# Patient Record
Sex: Female | Born: 1960
Health system: Southern US, Community
[De-identification: ages and names within clinical notes are randomized; demographics above are authoritative.]

## PROBLEM LIST (undated history)

## (undated) ENCOUNTER — Ambulatory Visit (HOSPITAL_COMMUNITY): Admission: EM | Payer: 59 | Source: Home / Self Care

## (undated) DIAGNOSIS — F329 Major depressive disorder, single episode, unspecified: Secondary | ICD-10-CM

## (undated) DIAGNOSIS — R0602 Shortness of breath: Secondary | ICD-10-CM

## (undated) DIAGNOSIS — D509 Iron deficiency anemia, unspecified: Secondary | ICD-10-CM

## (undated) DIAGNOSIS — I10 Essential (primary) hypertension: Secondary | ICD-10-CM

## (undated) DIAGNOSIS — E7800 Pure hypercholesterolemia, unspecified: Secondary | ICD-10-CM

## (undated) DIAGNOSIS — N189 Chronic kidney disease, unspecified: Secondary | ICD-10-CM

## (undated) DIAGNOSIS — F319 Bipolar disorder, unspecified: Secondary | ICD-10-CM

## (undated) DIAGNOSIS — R079 Chest pain, unspecified: Secondary | ICD-10-CM

## (undated) DIAGNOSIS — I2699 Other pulmonary embolism without acute cor pulmonale: Secondary | ICD-10-CM

## (undated) DIAGNOSIS — I82409 Acute embolism and thrombosis of unspecified deep veins of unspecified lower extremity: Secondary | ICD-10-CM

## (undated) DIAGNOSIS — J189 Pneumonia, unspecified organism: Secondary | ICD-10-CM

## (undated) DIAGNOSIS — T7840XA Allergy, unspecified, initial encounter: Secondary | ICD-10-CM

## (undated) DIAGNOSIS — F32A Depression, unspecified: Secondary | ICD-10-CM

## (undated) DIAGNOSIS — E119 Type 2 diabetes mellitus without complications: Secondary | ICD-10-CM

## (undated) HISTORY — DX: Depression, unspecified: F32.A

## (undated) HISTORY — DX: Allergy, unspecified, initial encounter: T78.40XA

## (undated) HISTORY — DX: Major depressive disorder, single episode, unspecified: F32.9

## (undated) HISTORY — DX: Essential (primary) hypertension: I10

## (undated) HISTORY — DX: Chronic kidney disease, unspecified: N18.9

---

## 2003-03-03 ENCOUNTER — Other Ambulatory Visit: Admission: RE | Admit: 2003-03-03 | Discharge: 2003-03-03 | Payer: Self-pay | Admitting: Obstetrics and Gynecology

## 2004-06-14 ENCOUNTER — Encounter: Admission: RE | Admit: 2004-06-14 | Discharge: 2004-06-14 | Payer: Self-pay | Admitting: Obstetrics and Gynecology

## 2005-06-20 ENCOUNTER — Encounter: Admission: RE | Admit: 2005-06-20 | Discharge: 2005-06-20 | Payer: Self-pay | Admitting: Obstetrics and Gynecology

## 2006-06-26 ENCOUNTER — Encounter: Admission: RE | Admit: 2006-06-26 | Discharge: 2006-06-26 | Payer: Self-pay | Admitting: Cardiovascular Disease

## 2007-08-12 ENCOUNTER — Encounter: Admission: RE | Admit: 2007-08-12 | Discharge: 2007-08-12 | Payer: Self-pay | Admitting: Obstetrics and Gynecology

## 2008-08-25 ENCOUNTER — Encounter: Admission: RE | Admit: 2008-08-25 | Discharge: 2008-08-25 | Payer: Self-pay | Admitting: Cardiovascular Disease

## 2009-09-14 ENCOUNTER — Encounter: Admission: RE | Admit: 2009-09-14 | Discharge: 2009-09-14 | Payer: Self-pay | Admitting: Obstetrics and Gynecology

## 2010-09-17 ENCOUNTER — Other Ambulatory Visit: Payer: Self-pay | Admitting: Cardiovascular Disease

## 2010-09-17 DIAGNOSIS — Z1239 Encounter for other screening for malignant neoplasm of breast: Secondary | ICD-10-CM

## 2010-09-30 ENCOUNTER — Ambulatory Visit
Admission: RE | Admit: 2010-09-30 | Discharge: 2010-09-30 | Disposition: A | Discharge status: Home / Self Care | Payer: 59 | Source: Ambulatory Visit | Attending: Cardiovascular Disease | Admitting: Cardiovascular Disease

## 2010-09-30 DIAGNOSIS — Z1239 Encounter for other screening for malignant neoplasm of breast: Secondary | ICD-10-CM

## 2011-08-28 ENCOUNTER — Other Ambulatory Visit: Payer: Self-pay | Admitting: Obstetrics and Gynecology

## 2011-08-28 DIAGNOSIS — Z1231 Encounter for screening mammogram for malignant neoplasm of breast: Secondary | ICD-10-CM

## 2011-10-09 ENCOUNTER — Ambulatory Visit
Admission: RE | Admit: 2011-10-09 | Discharge: 2011-10-09 | Disposition: A | Discharge status: Home / Self Care | Payer: 59 | Source: Ambulatory Visit | Attending: Obstetrics and Gynecology | Admitting: Obstetrics and Gynecology

## 2011-10-09 DIAGNOSIS — Z1231 Encounter for screening mammogram for malignant neoplasm of breast: Secondary | ICD-10-CM

## 2011-11-27 ENCOUNTER — Ambulatory Visit (INDEPENDENT_AMBULATORY_CARE_PROVIDER_SITE_OTHER): Payer: 59 | Admitting: Family Medicine

## 2011-11-27 VITALS — BP 156/96 | HR 64 | Temp 98.1°F | Resp 16 | Ht 62.5 in | Wt 170.4 lb

## 2011-11-27 DIAGNOSIS — H538 Other visual disturbances: Secondary | ICD-10-CM

## 2011-11-27 DIAGNOSIS — IMO0001 Reserved for inherently not codable concepts without codable children: Secondary | ICD-10-CM

## 2011-11-27 DIAGNOSIS — R631 Polydipsia: Secondary | ICD-10-CM

## 2011-11-27 LAB — POCT GLYCOSYLATED HEMOGLOBIN (HGB A1C): Hemoglobin A1C: >=14

## 2011-11-27 LAB — GLUCOSE, POCT (MANUAL RESULT ENTRY): POC Glucose: 436

## 2011-11-27 MED ORDER — METFORMIN HCL 500 MG PO TABS: ORAL_TABLET | ORAL | Status: DC

## 2011-11-27 NOTE — Progress Notes (Signed)
  Patient Name: Cynthia Cameron Date of Birth: October 23, 1960 Medical Record Number: [id number] Gender: female Date of Encounter: 11/27/2011  History of Present Illness:  Cynthia Cameron is a 51 y.o. very pleasant female patient who presents with the following:  Here today with concern regarding her glucose.  She has been told that she was "borderline" by her cardiologist.  She checked her glucose last night and it fluctuated between 320 and 400 just after eating dessert.    She has a cardiologist who treats her for HTN.   She also has been on zypreza for several years.  She notes increased thirst and urination, and blurry vision for a couple of months.  Actually these symptoms have been present for about 6 months, but worse for the last couple of months  There is no problem list on file for this patient.  No past medical history on file. No past surgical history on file. History  Substance Use Topics  . Smoking status: Never Smoker   . Smokeless tobacco: Not on file  . Alcohol Use: Not on file   No family history on file. No Known Allergies  Medication list has been reviewed and updated.  Review of Systems: As per HPI- otherwise negative.  Physical Examination: Filed Vitals:   11/27/11 1936  BP: 156/96  Pulse: 64  Temp: 98.1 F (36.7 C)  TempSrc: Oral  Resp: 16  Height: 5' 2.5" (1.588 m)  Weight: 170 lb 6.4 oz (77.293 kg)    Body mass index is 30.67 kg/(m^2).  GEN: WDWN, NAD, Non-toxic, A & O x 3, overweight HEENT: Atraumatic, Normocephalic. Neck supple. No masses, No LAD.  PEERL, EOMI, fundoscopic exam wnl Ears and Nose: No external deformity. CV: RRR, No M/G/R. No JVD. No thrill. No extra heart sounds. PULM: CTA B, no wheezes, crackles, rhonchi. No retractions. No resp. distress. No accessory muscle use. EXTR: No c/c/e NEURO Normal gait.  PSYCH: Normally interactive. Conversant. Not depressed or anxious appearing.  Calm demeanor.   Results for orders  placed in visit on 11/27/11  POCT GLYCOSYLATED HEMOGLOBIN (HGB A1C)      Component Value Range   Hemoglobin A1C >=14.0    GLUCOSE, POCT (MANUAL RESULT ENTRY)      Component Value Range   POC Glucose 436     Assessment and Plan: 1. Polydipsia  POCT glycosylated hemoglobin (Hb A1C), POCT glucose (manual entry)  2. Blurry vision    3. Diabetes mellitus  metFORMIN (GLUCOPHAGE) 500 MG tablet, Comprehensive metabolic panel   Uncontrolled DMII.  These symptoms are not acute.  Breindy is not eager to start injectable medications.  Will await CMP tomorrow and change plan if any sign of acidosis.  For now start metformin with goal of reaching 1000mg  BID in about one week.  Discussed diet changes.  She will see an eye doctor later on this week.    I did let Janeisha know that she will likely need insulin in the end due to her very high A1c, but she wishes to try oral medications first and this is a reasonable plan.   She has access to a glucose meter and will check her am fasting sugars and come see me in a week with her record

## 2011-11-27 NOTE — Patient Instructions (Addendum)
You have Diabetes- type 2.  Work on decreasing the sugar in your diet- especially sugar sweetened drinks.  Check your glucose in the morning before you eat and write it down.    Check out the American Diabetes Association website for more information and diet tips.   Recheck here in one week

## 2011-11-28 LAB — COMPREHENSIVE METABOLIC PANEL
ALT: 17 U/L (ref 0–35)
AST: 14 U/L (ref 0–37)
CO2: 29 mEq/L (ref 19–32)
Creat: 1.63 mg/dL — ABNORMAL HIGH (ref 0.50–1.10)
Sodium: 135 mEq/L (ref 135–145)
Total Bilirubin: 0.4 mg/dL (ref 0.3–1.2)
Total Protein: 7.3 g/dL (ref 6.0–8.3)

## 2011-11-29 ENCOUNTER — Telehealth: Payer: Self-pay

## 2011-11-29 NOTE — Telephone Encounter (Signed)
Dr Patsy Lager, letter/form is in your box

## 2011-11-29 NOTE — Telephone Encounter (Signed)
Pt employer needs more info regarding her diagnosis she dropped these papers off from her employer to be filled out by [place]

## 2011-11-30 ENCOUNTER — Encounter: Payer: Self-pay | Admitting: Family Medicine

## 2011-12-01 ENCOUNTER — Telehealth: Payer: Self-pay

## 2011-12-01 NOTE — Telephone Encounter (Signed)
.  umfc The patient called to state that [facility] at [facility] needs MD approval for diabetic meter upgrade and Rx for lancets, etc.  Pt saw Dr. Patsy Lager on  11/27/11.  Pt phone number is [phone].

## 2011-12-02 ENCOUNTER — Ambulatory Visit (INDEPENDENT_AMBULATORY_CARE_PROVIDER_SITE_OTHER): Payer: 59 | Admitting: Family Medicine

## 2011-12-02 DIAGNOSIS — IMO0001 Reserved for inherently not codable concepts without codable children: Secondary | ICD-10-CM

## 2011-12-02 DIAGNOSIS — D234 Other benign neoplasm of skin of scalp and neck: Secondary | ICD-10-CM

## 2011-12-02 DIAGNOSIS — Q828 Other specified congenital malformations of skin: Secondary | ICD-10-CM

## 2011-12-02 DIAGNOSIS — L659 Nonscarring hair loss, unspecified: Secondary | ICD-10-CM

## 2011-12-02 LAB — GLUCOSE, POCT (MANUAL RESULT ENTRY): POC Glucose: 408

## 2011-12-02 LAB — BASIC METABOLIC PANEL
BUN: 22 mg/dL (ref 6–23)
CO2: 29 mEq/L (ref 19–32)
Calcium: 10.1 mg/dL (ref 8.4–10.5)
Creat: 1.38 mg/dL — ABNORMAL HIGH (ref 0.50–1.10)
Glucose, Bld: 403 mg/dL — ABNORMAL HIGH (ref 70–99)

## 2011-12-02 LAB — LIPID PANEL: Cholesterol: 246 mg/dL — ABNORMAL HIGH (ref 0–200)

## 2011-12-02 LAB — TSH: TSH: 0.736 u[IU]/mL (ref 0.350–4.500)

## 2011-12-02 MED ORDER — BLOOD GLUCOSE METER KIT: PACK | Status: DC

## 2011-12-02 MED ORDER — LISINOPRIL 10 MG PO TABS: 10.0000 mg | ORAL_TABLET | Freq: Every day | ORAL | Status: DC

## 2011-12-02 NOTE — Telephone Encounter (Signed)
This was changed for patient at office visit today-- please disregard.

## 2011-12-02 NOTE — Progress Notes (Signed)
Patient Name: Cynthia Cameron Date of Birth: [birth year] Medical Record Number: [id number] Gender: female Date of Encounter: 12/02/2011  History of Present Illness:  Cynthia Cameron is a 51 y.o. very pleasant female patient who presents with the following:  Here today to discuss her DM again- she was here on 11/27/11 and diagnosed with DM.  She has started metformin and is feeling better- she is now up to one 500mg  in the am, and 2 in the evening.    She has some trouble with her vision still- she will see an eye doctor soon but is waiting until her glucose is better. She feels that her vision is better already and that she can do her job as long as her duties are modified   She also has some skin tags on her chest and neck that she would like to have removed.   There is no problem list on file for this patient.  No past medical history on file. No past surgical history on file. History  Substance Use Topics  . Smoking status: Never Smoker   . Smokeless tobacco: Not on file  . Alcohol Use: Yes     social   No family history on file. No Known Allergies  Medication list has been reviewed and updated.  Review of Systems: As per HPI- otherwise negative.   Physical Examination: Filed Vitals:   12/02/11 1343 12/02/11 1344  BP: 143/89 129/88  Pulse: 82   Temp: 99.1 F (37.3 C)   TempSrc: Oral   Resp: 16   Height: 5\' 4"  (1.626 m)   Weight:  168 lb (76.204 kg)    Body mass index is 28.84 kg/(m^2).  GEN: WDWN, NAD, Non-toxic, A & O x 3 HEENT: Atraumatic, Normocephalic. Neck supple. No masses, No LAD. Ears and Nose: No external deformity. CV: RRR, No M/G/R. No JVD. No thrill. No extra heart sounds. PULM: CTA B, no wheezes, crackles, rhonchi. No retractions. No resp. distress. No accessory muscle use. EXTR: No c/c/e NEURO Normal gait.  PSYCH: Normally interactive. Conversant. Not depressed or anxious appearing.  Calm demeanor.   There are multiple skin tags on her  neck and 2 under left arm.  VC obtained.  Tags cleansed with betadine and alcohol, then anest with 1% lidocaine.  Removed 8 total skin tags with dermablade.  Hemostasis with dry- sol, dressed with band- aids.  Tolerated procedure well   Results for orders placed in visit on 12/02/11  GLUCOSE, POCT (MANUAL RESULT ENTRY)      Component Value Range   POC Glucose 408      Assessment and Plan: 1. Diabetes mellitus  Blood Glucose Monitoring Suppl (BLOOD GLUCOSE METER) kit, Basic metabolic panel, Lipid panel, POCT glucose (manual entry), lisinopril (PRINIVIL,ZESTRIL) 10 MG tablet  2. Hair loss  TSH  3. Accessory skin tags     Removed skin tags as above.  Did letter for her job again and faxed again- also gave her a copy. There may be a few letters in her chart as it took [place] a few tries to get exactly what she needed.   Will do additional labs as above today.  Also stopped her norvasc and added a low dose of lisinopril for renal protection.  She will come in for a recheck in one week.  [name] is starting to realize that insulin will likely be necessary for her, and she is becoming more comfortable with the idea.  I asked her to read about the lantus  pen, as this would be a good choice for her.  We will likely teach her to use lantus at her visit next week

## 2011-12-02 NOTE — Patient Instructions (Signed)
Let's recheck in one week.  Read about the lantus solo- star pen online.  We will probably need to start this medication at our next visit- it is one shot a day.  Stop the norvasc Add lisinopril for blood pressure and kidney protection.

## 2011-12-03 ENCOUNTER — Encounter: Payer: Self-pay | Admitting: Family Medicine

## 2011-12-11 ENCOUNTER — Ambulatory Visit (INDEPENDENT_AMBULATORY_CARE_PROVIDER_SITE_OTHER): Payer: 59 | Admitting: Family Medicine

## 2011-12-11 DIAGNOSIS — Z23 Encounter for immunization: Secondary | ICD-10-CM

## 2011-12-11 DIAGNOSIS — E119 Type 2 diabetes mellitus without complications: Secondary | ICD-10-CM

## 2011-12-11 DIAGNOSIS — E7800 Pure hypercholesterolemia, unspecified: Secondary | ICD-10-CM

## 2011-12-11 LAB — GLUCOSE, POCT (MANUAL RESULT ENTRY): POC Glucose: 171

## 2011-12-11 MED ORDER — PRAVASTATIN SODIUM 40 MG PO TABS: 40.0000 mg | ORAL_TABLET | Freq: Every day | ORAL | Status: DC

## 2011-12-11 MED ORDER — PNEUMOCOCCAL VAC POLYVALENT 25 MCG/0.5ML IJ INJ
0.5000 mL | INJECTION | Freq: Once | INTRAMUSCULAR | Status: AC
  Administered 2011-12-11: 0.5 mL via INTRAMUSCULAR

## 2011-12-11 NOTE — Progress Notes (Signed)
  Patient Name: Cynthia Cameron Date of Birth: 12-May-1961 Medical Record Number: [id number] Gender: female Date of Encounter: 12/11/2011  History of Present Illness:  Cynthia Cameron is a 51 y.o. very pleasant female patient who presents with the following:  Here to evaluate diabetes- she was diagnosed abut 2 weeks ago.  She feels pretty well overall but notes that she does still have some diarrhea/ nausea with metformin.  Her fasting glucose was 174 this morning.  Vision is still burry.  She is not wearing he glasses because "my vision is still blurry with them," however they are required for her to drive  Her psychiatrist has also changed her medication- she is now tapering onto a new medication which she cannot recall the name of.  This is supposed to help with her blood sugar as well  Glucose log shows that her blood glucose is steadily declining.   This morning her fasting glucose was 174  There is no problem list on file for this patient.  No past medical history on file. No past surgical history on file. History  Substance Use Topics  . Smoking status: Never Smoker   . Smokeless tobacco: Not on file  . Alcohol Use: Yes     social   No family history on file. No Known Allergies  Medication list has been reviewed and updated.  Review of Systems: As per HPI- otherwise negative.   Physical Examination: Filed Vitals:   12/11/11 1656  BP: 130/86  Pulse: 74  Temp: 98.2 F (36.8 C)  TempSrc: Oral  Resp: 16  Height: 5' 2.5" (1.588 m)  Weight: 172 lb (78.019 kg)    Body mass index is 30.96 kg/(m^2).  GEN: WDWN, NAD, Non-toxic, A & O x 3 HEENT: Atraumatic, Normocephalic. Neck supple. No masses, No LAD. TM, oropharynx wnl Ears and Nose: No external deformity. CV: RRR, No M/G/R. No JVD. No thrill. No extra heart sounds. PULM: CTA B, no wheezes, crackles, rhonchi. No retractions. No resp. distress. No accessory muscle use. ABD: S, NT, ND, +BS. No rebound. No  HSM. EXTR: No c/c/e NEURO Normal gait.  PSYCH: Normally interactive. Conversant. Not depressed or anxious appearing.  Calm demeanor.   Results for orders placed in visit on 12/11/11  GLUCOSE, POCT (MANUAL RESULT ENTRY)      Component Value Range   POC Glucose 171      Assessment and Plan: 1. Diabetes mellitus  POCT glucose (manual entry), pravastatin (PRAVACHOL) 40 MG tablet, pneumococcal 23 valent vaccine (PNU-IMMUNE) injection 0.5 mL  2. Hypercholesterolemia  pravastatin (PRAVACHOL) 40 MG tablet   Added pravachol today for cholesterol control.  Cynthia Cameron may still end up needing to use insulin, but she is doing very well in lowering her glucose levels so far.  I am not sure if all of her vision issues are due to hyperglycemia- she may also have some diabetic retinopathy.  Encouraged her to go ahead and see her eye doctor this week.  I also did FMLA paperwork with a planned RTW date of 2 weeks.  She will come and see [place] in about a week- may check A1c then to see if there has been any positive change.  In the meantime keep up the good work with medications and diet change.    Gave pneumovax today as she has never had this vaccination

## 2011-12-25 ENCOUNTER — Ambulatory Visit (INDEPENDENT_AMBULATORY_CARE_PROVIDER_SITE_OTHER): Payer: 59 | Admitting: Family Medicine

## 2011-12-25 VITALS — BP 156/94 | HR 63 | Temp 98.7°F | Resp 18 | Ht 63.0 in | Wt 171.8 lb

## 2011-12-25 DIAGNOSIS — IMO0001 Reserved for inherently not codable concepts without codable children: Secondary | ICD-10-CM

## 2011-12-25 DIAGNOSIS — L819 Disorder of pigmentation, unspecified: Secondary | ICD-10-CM

## 2011-12-25 DIAGNOSIS — I10 Essential (primary) hypertension: Secondary | ICD-10-CM

## 2011-12-25 LAB — GLUCOSE, POCT (MANUAL RESULT ENTRY): POC Glucose: 114

## 2011-12-25 LAB — POCT GLYCOSYLATED HEMOGLOBIN (HGB A1C): Hemoglobin A1C: 13.1

## 2011-12-25 MED ORDER — LISINOPRIL 20 MG PO TABS: 20.0000 mg | ORAL_TABLET | Freq: Every day | ORAL | Status: DC

## 2011-12-25 NOTE — Progress Notes (Signed)
Subjective: She is here for a followup visit with regard to her diabetes. Dr. Dallas Schimke who told her that she would assess her today and probably let her go back to work. A few weeks ago her sugar was running 400 and she was treated with metformin 1000 mg twice a day. Pressures are now running about 140 on the morning sample. She also had skin tags taken off last visit which are doing okay. She had questions today about the pigment she has in her skin spares areas, such as between her breasts.  Objective: A pigmented areas on her chest wall between her breasts and a couple of little splotchy areas in the right upper chest.  She went to her doctor for recheck of her visual acuity. He did change a little bit, but is still stabilizing from the control of her diabetes.  Assessment: Diabetes poorly controlled Hypopigmentation secondary to diabetes Hypertension poorly controlled  Plan: Informed her that there is nothing that can be done about the pigment, except for controlling the blood sugar tightly. We will see whether it feeds were not attempted weight.  Check her hemoglobin A1c and glucose today.  We'll need to increase the lisinopril dose to try and get better control of blood pressure  Results for orders placed in visit on 12/25/11  GLUCOSE, POCT (MANUAL RESULT ENTRY)      Component Value Range   POC Glucose 114    POCT GLYCOSYLATED HEMOGLOBIN (HGB A1C)      Component Value Range   Hemoglobin A1C 13.1

## 2011-12-25 NOTE — Patient Instructions (Signed)
Continue to watch your diet carefully and take her medicines faithfully.  Increase the lisinopril to 20 mg daily. Return one more time in one month for a followup.

## 2012-01-29 ENCOUNTER — Ambulatory Visit (INDEPENDENT_AMBULATORY_CARE_PROVIDER_SITE_OTHER): Payer: 59 | Admitting: Family Medicine

## 2012-01-29 ENCOUNTER — Other Ambulatory Visit: Payer: Self-pay | Admitting: Family Medicine

## 2012-01-29 VITALS — BP 148/108 | HR 88 | Temp 98.2°F | Resp 16 | Ht 63.0 in | Wt 168.0 lb

## 2012-01-29 DIAGNOSIS — E119 Type 2 diabetes mellitus without complications: Secondary | ICD-10-CM

## 2012-01-29 DIAGNOSIS — I10 Essential (primary) hypertension: Secondary | ICD-10-CM

## 2012-01-29 DIAGNOSIS — N189 Chronic kidney disease, unspecified: Secondary | ICD-10-CM

## 2012-01-29 LAB — POCT GLYCOSYLATED HEMOGLOBIN (HGB A1C): Hemoglobin A1C: 9.1

## 2012-01-29 MED ORDER — GLIPIZIDE 5 MG PO TABS: ORAL_TABLET | ORAL | Status: DC

## 2012-01-29 NOTE — Patient Instructions (Signed)
Decrease your metformin to one pill in the morning, continue to take 2 at night Add glucotrol (glipizide)- start with one pill before breakfast.  We may increase to twice a day later I will refer you to an endocrinologist.

## 2012-01-29 NOTE — Progress Notes (Signed)
Patient Name: Cynthia Cameron Date of Birth: 1961/02/13 Medical Record Number: [id number] Gender: female Date of Encounter: 01/29/2012  History of Present Illness:  Cynthia Cameron is a 51 y.o. very pleasant female patient who presents with the following:  She is here today to recheck on her DM.  She continues to note nausea when she eats- "with every meal."  She now has some glasses and she uses these for close vision, she feels that she can see quite well at her job.  She is back at work.  Her fasting glucose is running between 120- 130.  She has been exercising and working on her weight  There is no problem list on file for this patient.  No past medical history on file. No past surgical history on file. History  Substance Use Topics  . Smoking status: Never Smoker   . Smokeless tobacco: Not on file  . Alcohol Use: Yes     social   No family history on file. No Known Allergies  Medication list has been reviewed and updated.  Prior to Admission medications   Medication Sig Start Date End Date Taking? Authorizing Provider  Blood Glucose Monitoring Suppl (BLOOD GLUCOSE METER) kit Use as instructed 12/02/11 12/01/12 Yes Gwenlyn Found Hamdi Vari, MD  buPROPion (WELLBUTRIN) 100 MG tablet Take 100 mg by mouth 1 day or 1 dose.   Yes Historical Provider, MD  lisinopril (PRINIVIL,ZESTRIL) 20 MG tablet Take 1 tablet (20 mg total) by mouth daily. 12/25/11 12/24/12 Yes Peyton Najjar, MD  metFORMIN (GLUCOPHAGE) 500 MG tablet Take one pill twice daily.  Build up to two pills twice daily over the course of a week. 11/27/11  Yes Degan Hanser C Leone Mobley, MD  nebivolol (BYSTOLIC) 5 MG tablet Take 5 mg by mouth daily.   Yes Historical Provider, MD  potassium bicarbonate (K-LYTE) 25 MEQ disintegrating tablet Take 25 mEq by mouth 2 (two) times daily.   Yes Historical Provider, MD  pravastatin (PRAVACHOL) 40 MG tablet Take 1 tablet (40 mg total) by mouth daily. 12/11/11 12/10/12 Yes Gwenlyn Found Kristain Filo, MD    Review of Systems:  As per HPI- otherwise negative.   Physical Examination: Filed Vitals:   01/29/12 1500  BP: 148/108  Pulse: 88  Temp: 98.2 F (36.8 C)  Resp: 16   Filed Vitals:   01/29/12 1500  Height: 5\' 3"  (1.6 m)  Weight: 168 lb (76.204 kg)   Body mass index is 29.76 kg/(m^2).  GEN: WDWN, NAD, Non-toxic, A & O x 3 HEENT: Atraumatic, Normocephalic. Neck supple. No masses, No LAD.  Tm and oropharynx wnl Ears and Nose: No external deformity. CV: RRR, No M/G/R. No JVD. No thrill. No extra heart sounds. PULM: CTA B, no wheezes, crackles, rhonchi. No retractions. No resp. distress. No accessory muscle use. ABD: S, NT, ND, +BS. No rebound. No HSM. EXTR: No c/c/e NEURO Normal gait.  PSYCH: Normally interactive. Conversant. Not depressed or anxious appearing.  Calm demeanor.   Results for orders placed in visit on 01/29/12  POCT GLYCOSYLATED HEMOGLOBIN (HGB A1C)      Component Value Range   Hemoglobin A1C 9.1     Assessment and Plan: 1. Diabetes mellitus type II  POCT glycosylated hemoglobin (Hb A1C), Basic metabolic panel, glipiZIDE (GLUCOTROL) 5 MG tablet   Elevated BP- asked her to keep an eye on this at work.   She is taking metformin 500 2 BID but is bothered by persistent nausea.  Will try and decrease her metformin to  one in the morning and 2 at night, and add glipizide 5mg  in the morning- await renal function prior to starting this in case we need to lower the dose.  We may increase this to BID with time.  Will also refer to endocrine per her request.  Cautioned regarding the increased risk of hypoglycemia, and urged her to keep a source of sugar (candy) available with her.  Abbe Amsterdam, MD 01/29/2012 3:14 PM

## 2012-01-30 LAB — CREATININE WITH EST GFR
Creat: 1.5 mg/dL — ABNORMAL HIGH (ref 0.50–1.10)
GFR, Est African American: 46 mL/min — ABNORMAL LOW
GFR, Est Non African American: 40 mL/min — ABNORMAL LOW

## 2012-01-30 LAB — BASIC METABOLIC PANEL
BUN: 15 mg/dL (ref 6–23)
Calcium: 9.3 mg/dL (ref 8.4–10.5)
Chloride: 102 mEq/L (ref 96–112)
Creat: 1.5 mg/dL — ABNORMAL HIGH (ref 0.50–1.10)

## 2012-02-01 ENCOUNTER — Encounter: Payer: Self-pay | Admitting: Family Medicine

## 2012-02-01 NOTE — Progress Notes (Signed)
Addended by: Abbe Amsterdam C on: 02/01/2012 03:22 PM   Modules accepted: Orders

## 2012-04-04 ENCOUNTER — Ambulatory Visit: Payer: 59 | Admitting: Dietician

## 2012-04-08 ENCOUNTER — Other Ambulatory Visit: Payer: Self-pay | Admitting: *Deleted

## 2012-04-08 ENCOUNTER — Encounter: Payer: 59 | Attending: Endocrinology | Admitting: *Deleted

## 2012-04-08 ENCOUNTER — Encounter: Payer: Self-pay | Admitting: *Deleted

## 2012-04-08 VITALS — Ht 64.0 in | Wt 174.9 lb

## 2012-04-08 DIAGNOSIS — Z713 Dietary counseling and surveillance: Secondary | ICD-10-CM | POA: Insufficient documentation

## 2012-04-08 DIAGNOSIS — I10 Essential (primary) hypertension: Secondary | ICD-10-CM | POA: Insufficient documentation

## 2012-04-08 DIAGNOSIS — E119 Type 2 diabetes mellitus without complications: Secondary | ICD-10-CM

## 2012-04-08 NOTE — Progress Notes (Signed)
Medical Nutrition Therapy:  Appt start time: 1030 end time:  1130.  Assessment:  Primary concerns today: Type 2 Diabetes with hypertension. Patient diagnosed with type 2 diabetes April 2013. HgbA1c 6.9. She reports that she has been trying to eat healthier since before diagnosis. She eats healthy during the week and splurges a little on weekends. She lost 30 pounds over 3 months, but has since regained 10 pounds. She would like to lose an additional 30 pounds. She checks her blood glucose in the AM, usual reading ~100. She also reports that she has been monitoring sodium intake for hypertension.   WEIGHT: 174.9 pounds BMI: 30.1  MEDICATIONS: Kazano, glipizide, B12, iron, MVI, pravastatin, Klor-Con, lisinopril, Bystolic, olanzapine, clonidine   DIETARY INTAKE:   Usual eating pattern includes 3 meals and 2 snacks per day.  24-hr recall:  B ( AM): Cereal with 2% milk, protein shake  Snk ( AM): sometimes, Nutrigrain bar/protein bar  L ( PM): Sandwich with mayo, fruit OR leftover grilled chicken/fish with creamed potatoes/rice, vegetable, water Snk ( PM): None D ( PM): Grilled chicken/fish, starch, vegetable, water Snk ( PM): cereal, fruit Beverages: water, soda on weekends  Usual physical activity: Gym/treadmill at home, twice weekly (Tuesday/Saturday) 1-1.5 hour walking on treadmill, weights  Estimated energy needs: 1300 calories 146 g carbohydrates 98 g protein 36 g fat  Progress Towards Goal(s):  In progress.   Nutritional Diagnosis:  NB-1.1 Food and nutrition-related knowledge deficit As related to new diagnosed diabetes.  As evidenced by no need for prior education.    Intervention:  Nutrition counseling. We discussed basic carbohydrate counting for diabetes, including foods with carbohydrates, portion size, label reading, and meal planning. We also dicussed a low sodium diet.   Goals: 1. 3 carbohydrate choices at meals, 1 choice at snacks.  2. Monitor portion size of  carbohydrate foods, and choose healthy, lower calorie options.  3. 1300 calories daily for 1 pound weight loss per week.  4. Continue to exercise at least 150 minutes weekly.   Handouts given during visit include:  Living Well with Diabetes booklet  Yellow portion card  Low sodium nutrition therapy  Monitoring/Evaluation:  Dietary intake, exercise, blood glucose, and body weight in 1 month(s).

## 2012-04-08 NOTE — Patient Instructions (Signed)
Goals: 1. 3 carbohydrate choices at meals, 1 choice at snacks.  2. Monitor portion size of carbohydrate foods, and choose healthy, lower calorie options.  3. 1300 calories daily for 1 pound weight loss per week.  4. Continue to exercise at least 150 minutes weekly.

## 2012-05-08 ENCOUNTER — Encounter: Payer: Self-pay | Admitting: Family Medicine

## 2012-05-13 ENCOUNTER — Encounter: Payer: Self-pay | Admitting: *Deleted

## 2012-05-13 ENCOUNTER — Encounter: Payer: 59 | Attending: Endocrinology | Admitting: *Deleted

## 2012-05-13 VITALS — Ht 64.0 in | Wt 174.4 lb

## 2012-05-13 DIAGNOSIS — I10 Essential (primary) hypertension: Secondary | ICD-10-CM | POA: Insufficient documentation

## 2012-05-13 DIAGNOSIS — E119 Type 2 diabetes mellitus without complications: Secondary | ICD-10-CM | POA: Insufficient documentation

## 2012-05-13 DIAGNOSIS — Z713 Dietary counseling and surveillance: Secondary | ICD-10-CM | POA: Insufficient documentation

## 2012-05-13 NOTE — Progress Notes (Signed)
Medical Nutrition Therapy:  Appt start time: 1115 end time:  1145.  Assessment:  Primary concerns today: Type 2 diabetes follow up. Patient has been more conscious about eating habits. She has been reading food labels and looking at sugar content. She has been using the hand method for estimating portion size. However, she reports she is not actively counting carbs. She continues to exercise at least 150 minutes weekly. Weight has not changed. Fasting blood glucose <100. She is scheduled for A1c test next month.   WEIGHT 174.4 pounds BMI: 29.9  MEDICATIONS: Glipizide, Metformin, B12, iron, MVI   DIETARY INTAKE:   Usual eating pattern includes 3 meals and 2-3 snacks per day.  24-hr recall:  B ( AM): Cereal with 2% milk, orange juice  Snk ( AM): Protein bar or apple  L ( PM): [employer] 6 inch club/cold cut or [place] sandwich from home, yogurt, water Snk ( PM): Protein shake with banana D ( PM): Grilled Fish, vegetables (green beans, broccoli), rice/creamed potatoes, water Snk ( PM): Yogurt or banana Beverages: Orange juice, water, protein shakes  Usual physical activity: Treadmill at gym or at home 20 minutes daily  Estimated energy needs: 1300 calories 146 g carbohydrates 98 g protein 36 g fat  Progress Towards Goal(s):  Some progress.   Nutritional Diagnosis:  NB-1.1 Food and nutrition-related knowledge deficit As related to new diagnosed diabetes. As evidenced by no need for prior education. Ongoing    Intervention:  Nutrition counseling. Reinforced the concept of basic carbohydrate counting with patient. Patient encouraged to look at "Total Carbohydrates" on the food label. She was also encouraged to actively count carbohydrates. We also reviewed strategies to reduce calories in the diet to promote weight loss.   Goals:  1. 3 carbohydrate choices at meals, 1 choice at snacks - progressing 2. Monitor portion size of carbohydrate foods, and choose healthy, lower calorie options -  progressing 3. 1300 calories daily for 1 pound weight loss per week - not progressing  4. Continue to exercise at least 150 minutes weekly - met 5. Only consume protein shake as a meal replacement, and not a snack. Choose fruit or low fat yogurt as a snack instead.  6. Read label for total carbohydrates.   Handouts given during visit include: None  Monitoring/Evaluation:  Dietary intake, exercise, blood glucose, and body weight prn.

## 2012-09-19 ENCOUNTER — Other Ambulatory Visit: Payer: Self-pay | Admitting: Obstetrics and Gynecology

## 2012-09-19 DIAGNOSIS — Z1231 Encounter for screening mammogram for malignant neoplasm of breast: Secondary | ICD-10-CM

## 2012-09-28 DIAGNOSIS — I2699 Other pulmonary embolism without acute cor pulmonale: Secondary | ICD-10-CM

## 2012-09-28 DIAGNOSIS — J189 Pneumonia, unspecified organism: Secondary | ICD-10-CM

## 2012-09-28 HISTORY — DX: Pneumonia, unspecified organism: J18.9

## 2012-09-28 HISTORY — PX: CARDIAC CATHETERIZATION: SHX172

## 2012-09-28 HISTORY — DX: Other pulmonary embolism without acute cor pulmonale: I26.99

## 2012-10-21 ENCOUNTER — Ambulatory Visit: Payer: 59

## 2012-10-24 ENCOUNTER — Observation Stay (HOSPITAL_COMMUNITY)
Admission: AD | Admit: 2012-10-24 | Discharge: 2012-10-25 | Disposition: A | Discharge status: Home / Self Care | Payer: 59 | Source: Ambulatory Visit | Attending: Cardiovascular Disease | Admitting: Cardiovascular Disease

## 2012-10-24 ENCOUNTER — Ambulatory Visit: Payer: 59

## 2012-10-24 ENCOUNTER — Encounter (HOSPITAL_COMMUNITY): Payer: Self-pay | Admitting: *Deleted

## 2012-10-24 ENCOUNTER — Ambulatory Visit (INDEPENDENT_AMBULATORY_CARE_PROVIDER_SITE_OTHER): Payer: 59 | Admitting: Family Medicine

## 2012-10-24 ENCOUNTER — Other Ambulatory Visit: Payer: Self-pay

## 2012-10-24 VITALS — BP 140/96 | HR 97 | Temp 97.9°F | Resp 14 | Ht 63.0 in | Wt 173.0 lb

## 2012-10-24 DIAGNOSIS — R079 Chest pain, unspecified: Secondary | ICD-10-CM

## 2012-10-24 DIAGNOSIS — F329 Major depressive disorder, single episode, unspecified: Secondary | ICD-10-CM | POA: Insufficient documentation

## 2012-10-24 DIAGNOSIS — I10 Essential (primary) hypertension: Secondary | ICD-10-CM | POA: Insufficient documentation

## 2012-10-24 DIAGNOSIS — E119 Type 2 diabetes mellitus without complications: Secondary | ICD-10-CM | POA: Insufficient documentation

## 2012-10-24 DIAGNOSIS — R0789 Other chest pain: Principal | ICD-10-CM | POA: Insufficient documentation

## 2012-10-24 DIAGNOSIS — F3289 Other specified depressive episodes: Secondary | ICD-10-CM | POA: Insufficient documentation

## 2012-10-24 DIAGNOSIS — Z79899 Other long term (current) drug therapy: Secondary | ICD-10-CM | POA: Insufficient documentation

## 2012-10-24 LAB — CBC WITH DIFFERENTIAL/PLATELET
Basophils Absolute: 0 10*3/uL (ref 0.0–0.1)
Lymphocytes Relative: 20 % (ref 12–46)
Lymphs Abs: 1.6 10*3/uL (ref 0.7–4.0)
Neutrophils Relative %: 73 % (ref 43–77)
Platelets: 291 10*3/uL (ref 150–400)
RBC: 4.61 MIL/uL (ref 3.87–5.11)
RDW: 14.1 % (ref 11.5–15.5)
WBC: 8.2 10*3/uL (ref 4.0–10.5)

## 2012-10-24 LAB — COMPREHENSIVE METABOLIC PANEL
ALT: 11 U/L (ref 0–35)
Albumin: 3.6 g/dL (ref 3.5–5.2)
Alkaline Phosphatase: 96 U/L (ref 39–117)
BUN: 24 mg/dL — ABNORMAL HIGH (ref 6–23)
Calcium: 10 mg/dL (ref 8.4–10.5)
Potassium: 3.5 mEq/L (ref 3.5–5.1)
Sodium: 143 mEq/L (ref 135–145)
Total Protein: 8.2 g/dL (ref 6.0–8.3)

## 2012-10-24 LAB — PROTIME-INR
INR: 1.06 (ref 0.00–1.49)
Prothrombin Time: 13.7 seconds (ref 11.6–15.2)

## 2012-10-24 LAB — TROPONIN I: Troponin I: <0.3 ng/mL (ref <0.30)

## 2012-10-24 LAB — GLUCOSE, CAPILLARY: Glucose-Capillary: 110 mg/dL — ABNORMAL HIGH (ref 70–99)

## 2012-10-24 MED ORDER — MORPHINE SULFATE 2 MG/ML IJ SOLN
INTRAMUSCULAR | Status: AC
  Administered 2012-10-24: 2 mg via INTRAVENOUS
  Filled 2012-10-24: qty 1

## 2012-10-24 MED ORDER — SODIUM CHLORIDE 0.9 % IJ SOLN: 3.0000 mL | Freq: Two times a day (BID) | INTRAMUSCULAR | Status: DC

## 2012-10-24 MED ORDER — INSULIN ASPART 100 UNIT/ML ~~LOC~~ SOLN
0.0000 [IU] | Freq: Three times a day (TID) | SUBCUTANEOUS | Status: DC
  Administered 2012-10-25: 3 [IU] via SUBCUTANEOUS
  Administered 2012-10-25: 5 [IU] via SUBCUTANEOUS

## 2012-10-24 MED ORDER — FERROUS SULFATE 325 (65 FE) MG PO TABS
325.0000 mg | ORAL_TABLET | Freq: Every day | ORAL | Status: DC
  Administered 2012-10-25: 325 mg via ORAL
  Filled 2012-10-24 (×2): qty 1

## 2012-10-24 MED ORDER — PRAVASTATIN SODIUM 40 MG PO TABS
40.0000 mg | ORAL_TABLET | Freq: Every day | ORAL | Status: DC
  Administered 2012-10-24 – 2012-10-25 (×2): 40 mg via ORAL
  Filled 2012-10-24 (×2): qty 1

## 2012-10-24 MED ORDER — ASPIRIN EC 81 MG PO TBEC
81.0000 mg | DELAYED_RELEASE_TABLET | Freq: Every day | ORAL | Status: DC
  Administered 2012-10-25: 81 mg via ORAL
  Filled 2012-10-24: qty 1

## 2012-10-24 MED ORDER — SODIUM CHLORIDE 0.9 % IV SOLN: 250.0000 mL | INTRAVENOUS | Status: DC | PRN

## 2012-10-24 MED ORDER — HEPARIN (PORCINE) IN NACL 100-0.45 UNIT/ML-% IJ SOLN
900.0000 [IU]/h | INTRAMUSCULAR | Status: DC
  Administered 2012-10-24: 900 [IU]/h via INTRAVENOUS
  Filled 2012-10-24 (×2): qty 250

## 2012-10-24 MED ORDER — SODIUM CHLORIDE 0.9 % IJ SOLN: 3.0000 mL | INTRAMUSCULAR | Status: DC | PRN

## 2012-10-24 MED ORDER — ACETAMINOPHEN 325 MG PO TABS
650.0000 mg | ORAL_TABLET | ORAL | Status: DC | PRN
  Administered 2012-10-25: 650 mg via ORAL
  Filled 2012-10-24 (×2): qty 2

## 2012-10-24 MED ORDER — HEPARIN BOLUS VIA INFUSION
4000.0000 [IU] | Freq: Once | INTRAVENOUS | Status: AC
  Administered 2012-10-24: 4000 [IU] via INTRAVENOUS
  Filled 2012-10-24: qty 4000

## 2012-10-24 MED ORDER — METFORMIN HCL 500 MG PO TABS
500.0000 mg | ORAL_TABLET | Freq: Two times a day (BID) | ORAL | Status: DC
Start: 2012-10-25 — End: 2012-10-25
  Filled 2012-10-24 (×3): qty 1

## 2012-10-24 MED ORDER — GLIPIZIDE 2.5 MG HALF TABLET
2.5000 mg | ORAL_TABLET | Freq: Two times a day (BID) | ORAL | Status: DC
  Administered 2012-10-25: 2.5 mg via ORAL
  Filled 2012-10-24 (×3): qty 1

## 2012-10-24 MED ORDER — MORPHINE SULFATE 2 MG/ML IJ SOLN: 2.0000 mg | Freq: Once | INTRAMUSCULAR | Status: AC

## 2012-10-24 MED ORDER — LISINOPRIL 20 MG PO TABS
20.0000 mg | ORAL_TABLET | Freq: Every day | ORAL | Status: DC
  Administered 2012-10-24 – 2012-10-25 (×2): 20 mg via ORAL
  Filled 2012-10-24 (×2): qty 1

## 2012-10-24 MED ORDER — ALPRAZOLAM 0.25 MG PO TABS
0.2500 mg | ORAL_TABLET | Freq: Two times a day (BID) | ORAL | Status: DC | PRN
  Administered 2012-10-24: 0.25 mg via ORAL
  Filled 2012-10-24: qty 1

## 2012-10-24 MED ORDER — HYDROCODONE-ACETAMINOPHEN 5-325 MG PO TABS
1.0000 | ORAL_TABLET | ORAL | Status: DC | PRN
  Administered 2012-10-24 – 2012-10-25 (×2): 1 via ORAL
  Filled 2012-10-24 (×2): qty 1

## 2012-10-24 MED ORDER — CLONIDINE HCL 0.2 MG PO TABS
0.2000 mg | ORAL_TABLET | Freq: Every day | ORAL | Status: DC
  Administered 2012-10-24: 0.2 mg via ORAL
  Filled 2012-10-24 (×2): qty 1

## 2012-10-24 MED ORDER — NITROGLYCERIN 0.4 MG SL SUBL
0.4000 mg | SUBLINGUAL_TABLET | SUBLINGUAL | Status: DC | PRN
  Administered 2012-10-24: 0.4 mg via SUBLINGUAL

## 2012-10-24 MED ORDER — SODIUM CHLORIDE 0.9 % IV SOLN
INTRAVENOUS | Status: DC
  Administered 2012-10-24 – 2012-10-25 (×2): via INTRAVENOUS

## 2012-10-24 MED ORDER — MORPHINE SULFATE 2 MG/ML IJ SOLN
2.0000 mg | Freq: Once | INTRAMUSCULAR | Status: AC
  Administered 2012-10-24: 2 mg via INTRAVENOUS
  Filled 2012-10-24: qty 1

## 2012-10-24 MED ORDER — BUPROPION HCL 100 MG PO TABS
50.0000 mg | ORAL_TABLET | Freq: Every day | ORAL | Status: DC
  Administered 2012-10-24 – 2012-10-25 (×2): 50 mg via ORAL
  Filled 2012-10-24 (×3): qty 0.5

## 2012-10-24 MED ORDER — NITROGLYCERIN 0.4 MG SL SUBL
SUBLINGUAL_TABLET | SUBLINGUAL | Status: AC
  Administered 2012-10-24: 16:00:00
  Filled 2012-10-24: qty 25

## 2012-10-24 MED ORDER — ONDANSETRON HCL 4 MG/2ML IJ SOLN: 4.0000 mg | Freq: Four times a day (QID) | INTRAMUSCULAR | Status: DC | PRN

## 2012-10-24 MED ORDER — CLOPIDOGREL BISULFATE 75 MG PO TABS
75.0000 mg | ORAL_TABLET | Freq: Every day | ORAL | Status: DC
  Administered 2012-10-25: 75 mg via ORAL
  Filled 2012-10-24: qty 1

## 2012-10-24 MED ORDER — NON FORMULARY: 40.0000 mg | Freq: Every day | Status: DC

## 2012-10-24 MED ORDER — POTASSIUM CHLORIDE CRYS ER 20 MEQ PO TBCR
20.0000 meq | EXTENDED_RELEASE_TABLET | Freq: Two times a day (BID) | ORAL | Status: DC
  Administered 2012-10-24 – 2012-10-25 (×2): 20 meq via ORAL
  Filled 2012-10-24 (×3): qty 1

## 2012-10-24 MED ORDER — NEBIVOLOL HCL 5 MG PO TABS
5.0000 mg | ORAL_TABLET | Freq: Every day | ORAL | Status: DC
  Administered 2012-10-24 – 2012-10-25 (×2): 5 mg via ORAL
  Filled 2012-10-24 (×2): qty 1

## 2012-10-24 NOTE — Progress Notes (Signed)
MD call around 2013 when pt c/o rt sided CP. EKG showed NSR with non-specific T wave abnormality. Bp 161/113 hr 96 R 16, O2 sat 100% 2L via [place]. Pt given 0.4 mg of SL Nitro. After the nitro pt's were bp  166/91, hr 88, r 16, O2 sat 100% on 2L via [place] & R 16.  Pt was  given 1 vicodin at 2026 & 2mg  of morphine at 2115. Pt already on a heparin drip . Will continue to monitor the pt. Sanda Linger

## 2012-10-24 NOTE — Patient Instructions (Addendum)
Proceed to Dr. Roseanne Kaufman office right now, he will see you in clinic today

## 2012-10-24 NOTE — Progress Notes (Signed)
ANTICOAGULATION CONSULT NOTE - Initial Consult  Pharmacy Consult for heparin  Indication: chest pain/ACS  No Known Allergies  Patient Measurements: Height: 5\' 4"  (162.6 cm) Weight: 172 lb 14.4 oz (78.427 kg) IBW/kg (Calculated) : 54.7 Heparin Dosing Weight: 71 kg  Vital Signs: Temp: 98.4 F (36.9 C) (02/27 1539) Temp src: Oral (02/27 1539) BP: 145/94 mmHg (02/27 1558) Pulse Rate: 93 (02/27 1558)  Labs: No results found for this basename: HGB, HCT, PLT, APTT, LABPROT, INR, HEPARINUNFRC, CREATININE, CKTOTAL, CKMB, TROPONINI,  in the last 72 hours  Estimated Creatinine Clearance: 44.5 ml/min (by C-G formula based on Cr of 1.5).   Medical History: Past Medical History  Diagnosis Date  . Allergy   . Depression   . Diabetes mellitus without complication   . Hypertension     Medications:  Prescriptions prior to admission  Medication Sig Dispense Refill  . buPROPion (WELLBUTRIN) 100 MG tablet Take 50 mg by mouth daily.       . cloNIDine (CATAPRES) 0.2 MG tablet Take 0.2 mg by mouth at bedtime.      . cyanocobalamin 1000 MCG tablet Take 1,000 mcg by mouth daily.       . ferrous sulfate 325 (65 FE) MG tablet Take 325 mg by mouth daily with breakfast.      . glipiZIDE (GLUCOTROL) 5 MG tablet Take 2.5 mg by mouth daily. Start with one pill in the morning.  May increase to twice daily if directed.      Marland Kitchen lisinopril (PRINIVIL,ZESTRIL) 20 MG tablet Take 20 mg by mouth daily.      . metFORMIN (GLUCOPHAGE) 500 MG tablet Take 500 mg by mouth 2 (two) times daily. Take one pill twice daily.  Build up to two pills twice daily over the course of a week.      . Multiple Vitamin (MULTIVITAMIN) tablet Take 1 tablet by mouth daily.      . nebivolol (BYSTOLIC) 5 MG tablet Take 5 mg by mouth daily.      . potassium chloride (K-DUR,KLOR-CON) 10 MEQ tablet Take 20 mEq by mouth 2 (two) times daily.       . pravastatin (PRAVACHOL) 40 MG tablet Take 40 mg by mouth daily.      . [DISCONTINUED]  glipiZIDE (GLUCOTROL) 5 MG tablet Start with one pill in the morning.  May increase to twice daily if directed.  60 tablet  3  . [DISCONTINUED] lisinopril (PRINIVIL,ZESTRIL) 20 MG tablet Take 1 tablet (20 mg total) by mouth daily.  90 tablet  3  . [DISCONTINUED] metFORMIN (GLUCOPHAGE) 500 MG tablet Take one pill twice daily.  Build up to two pills twice daily over the course of a week.  120 tablet  5  . [DISCONTINUED] pravastatin (PRAVACHOL) 40 MG tablet Take 1 tablet (40 mg total) by mouth daily.  90 tablet  3    Assessment: 52 yo F with no cardiac history but h/o DM, CP started last PM went to urgent care then [facility] office?  Now being admitted for R/O MI, to start heparin.  Baseline PTT, INR, CBC ordered.  Goal of Therapy:  Heparin level 0.3-0.7 units/ml Monitor platelets by anticoagulation protocol: Yes   Plan:  - Heparin 4000 units IV x1  - Heparin 900 units/hr - F/u baseline labs - Check 8h heparin level - Check daily heparin level and CBC   Shavonta Gossen L. Illene Bolus, PharmD, BCPS Clinical Pharmacist Pager: [phone] Pharmacy: [phone] 10/24/2012 5:29 PM

## 2012-10-24 NOTE — H&P (Signed)
Cynthia Cameron is an 52 y.o. female.   Chief Complaint: Chest pain HPI:  52 yr old female complains of right sided chest pain that started last night around 7-8pm. It feels "heavy", "like bricks on my chest". She gets sharp pains when she lies on her side, all right sided pain. No radiation of pain. Never had chest pain like this before. No nausea, diaphoresis, dizziness,Some shortness of breath. No coughing. Some pain on deep insp. No leg pain, leg swelling, recent travel, hormonal contraception. No smoking. Has taken two pain pills (not sure what they were), this helped somewhat but did not relieve the pain. No improvement with SL NTG use.   Past Medical History  Diagnosis Date  . Allergy   . Depression   . Diabetes mellitus without complication   . Hypertension       History reviewed. No pertinent past surgical history.  History reviewed. No pertinent family history. Social History:  reports that she has never smoked. She does not have any smokeless tobacco history on file. She reports that  drinks alcohol. She reports that she does not use illicit drugs.  Allergies: No Known Allergies  Medications Prior to Admission  Medication Sig Dispense Refill  . buPROPion (WELLBUTRIN) 100 MG tablet Take 50 mg by mouth daily.       . cloNIDine (CATAPRES) 0.2 MG tablet Take 0.2 mg by mouth at bedtime.      . cyanocobalamin 1000 MCG tablet Take 1,000 mcg by mouth daily.       . ferrous sulfate 325 (65 FE) MG tablet Take 325 mg by mouth daily with breakfast.      . glipiZIDE (GLUCOTROL) 5 MG tablet Take 2.5 mg by mouth daily. Start with one pill in the morning.  May increase to twice daily if directed.      Marland Kitchen lisinopril (PRINIVIL,ZESTRIL) 20 MG tablet Take 20 mg by mouth daily.      . metFORMIN (GLUCOPHAGE) 500 MG tablet Take 500 mg by mouth 2 (two) times daily. Take one pill twice daily.  Build up to two pills twice daily over the course of a week.      . Multiple Vitamin (MULTIVITAMIN)  tablet Take 1 tablet by mouth daily.      . nebivolol (BYSTOLIC) 5 MG tablet Take 5 mg by mouth daily.      . potassium chloride (K-DUR,KLOR-CON) 10 MEQ tablet Take 20 mEq by mouth 2 (two) times daily.       . pravastatin (PRAVACHOL) 40 MG tablet Take 40 mg by mouth daily.      . [DISCONTINUED] glipiZIDE (GLUCOTROL) 5 MG tablet Start with one pill in the morning.  May increase to twice daily if directed.  60 tablet  3  . [DISCONTINUED] lisinopril (PRINIVIL,ZESTRIL) 20 MG tablet Take 1 tablet (20 mg total) by mouth daily.  90 tablet  3  . [DISCONTINUED] metFORMIN (GLUCOPHAGE) 500 MG tablet Take one pill twice daily.  Build up to two pills twice daily over the course of a week.  120 tablet  5  . [DISCONTINUED] pravastatin (PRAVACHOL) 40 MG tablet Take 1 tablet (40 mg total) by mouth daily.  90 tablet  3    No results found for this or any previous visit (from the past 48 hour(s)). Dg Chest 2 View  10/24/2012  *RADIOLOGY REPORT*  Clinical Data: Chest pain.  CHEST - 2 VIEW  Comparison: None.  Findings: Heart and mediastinal contours are within normal limits. No focal  opacities or effusions.  No acute bony abnormality.  IMPRESSION: No active cardiopulmonary disease.   Original Report Authenticated By: Charlett Nose, M.D.     @ROS @ Constitutional: Negative for fever and chills.  HENT: Negative.  Respiratory: Negative for cough, chest tightness, shortness of breath and wheezing.  Cardiovascular: Positive for chest pain. Negative for palpitations.  Gastrointestinal: Negative for nausea, vomiting and abdominal pain.  Musculoskeletal: Negative for back pain and arthralgias.  Skin: Negative.  Neurological: Negative for dizziness, syncope, light-headedness and headaches.   Physical Exam Blood pressure 145/94, pulse 93, temperature 98.4 F (36.9 C), temperature source Oral, resp. rate 16, height 5\' 4"  (1.626 m), weight 78.427 kg (172 lb 14.4 oz), last menstrual period 09/16/2010, SpO2 98.00%.    Constitutional: She appears well-developed and well-nourished. No distress.  HENT: Head: Normocephalic and atraumatic. Eyes: Manson Passey, Conjunctivae are normal. No scleral icterus.  Neck: supple. FROM Cardiovascular: Normal rate, regular rhythm and normal heart sounds. Exam reveals no gallop and no friction rub. II/VI systolic murmur heard.  Pulmonary/Chest: Effort normal and breath sounds normal. She has no wheezes. She has no rales.  Some right chest tenderness on palpation.  Abdominal: Soft. Bowel sounds are normal. There is no tenderness.  Neurological: She is alert and oriented to person, place, and time.  Skin: Skin is warm and dry.  Psychiatric: She has a normal mood and affect. Her behavior is normal.    Assessment/Plan Chest pain Hypertension DM, II  Place in observation, R/O MI Nuclear stress test v/s c. Cath.  [place] Basham S 10/24/2012, 5:22 PM

## 2012-10-24 NOTE — Progress Notes (Signed)
Subjective:    Patient ID: Cynthia Cameron, female    DOB: May 31, 1961, 52 y.o.   MRN: [id number]  HPI   Cynthia Cameron is a pleasant 52 yr old female here complaining of right sided chest pain that started last night around 7-8pm.  It feels "heavy", "like bricks on my chest".  She gets sharp pains when she lies on her side, all right sided pain.  No radiation of pain.  Never had chest pain like this before.  No nausea, diaphoresis, dizziness, lightheadedness.  No numbness or tingling.  Denies any mechanism of injury.  No coughing, SOB, wheezing.  No pain on deep insp.  No leg pain, leg swelling, recent travel, hormonal contraception.  No smoking.  Has taken two pain pills (not sure what they were), this helped somewhat but did not relieve the pain.  States she does have a heart doctor, Dr. Algie Coffer.  Sees him for HTN, denies any personal or family cardiac history.    Pt does have DM, diagnosed here April 2013.  At that time A1C > 14, symptomatic.  Pt states blood sugars currently much better.  Last A1C in chart is 9.1 in June 2013.  Denies neuropathy.    Review of Systems  Constitutional: Negative for fever and chills.  HENT: Negative.   Respiratory: Negative for cough, chest tightness, shortness of breath and wheezing.   Cardiovascular: Positive for chest pain. Negative for palpitations.  Gastrointestinal: Negative for nausea, vomiting and abdominal pain.  Musculoskeletal: Negative for back pain and arthralgias.  Skin: Negative.   Neurological: Negative for dizziness, syncope, light-headedness and headaches.       Objective:   Physical Exam  Vitals reviewed. Constitutional: She is oriented to person, place, and time. She appears well-developed and well-nourished. No distress.  HENT:  Head: Normocephalic and atraumatic.  Eyes: Conjunctivae are normal. No scleral icterus.  Neck: Neck supple.  Cardiovascular: Normal rate, regular rhythm and normal heart sounds.  Exam reveals no gallop  and no friction rub.   No murmur heard. Pulmonary/Chest: Effort normal and breath sounds normal. She has no wheezes. She has no rales.  Some TTP over right chest, but not reproducing pain  Abdominal: Soft. Bowel sounds are normal. There is no tenderness.  Neurological: She is alert and oriented to person, place, and time.  Skin: Skin is warm and dry.  Psychiatric: She has a normal mood and affect. Her behavior is normal.    Filed Vitals:   10/24/12 1220  BP: 140/96  Pulse: 97  Temp: 97.9 F (36.6 C)  Resp: 14    [facility] reading (PRIMARY) by  Dr. Neva Seat - no acute abnormality  EKG reading by Dr. Neva Seat - nonspecific T wave changes      Assessment & Plan:  Chest pain - Plan: EKG 12-Lead, DG Chest 2 View   Cynthia Cameron is a pleasant 52 yr old female with approx 16 hours of chest pain/pressure.  She is in no distress.  No history of ACS.  But pt is diabetic, previously very uncontrolled.  EKG is borderline today with some non-specific T-wave changes.  CXR is normal.  CP is not really reproducible on palpation.  No injury.  Concerned that pain has a cardiac source, esp given HTN and DM.  Would like for pt to be ruled out at the hospital.  Pt is resistant to this, would like to see cardiology instead.  I have spoken with Dr. Algie Coffer who said he will see her in clinic  this afternoon.  She will proceed directly to his office from here.  I have printed a copy of her EKG to bring with her.  Discussed with her the unpredictable nature of CP, esp if it is cardiac in origin.  She understands the risks and will proceed to Dr. Roseanne Kaufman office.

## 2012-10-24 NOTE — Progress Notes (Signed)
Pt complaining of 8/10 cp. Gave 2 sl nitro with no relief. Gave 2mg  morphine, pain went down to 0, no EKG changes. Will monitor pt.

## 2012-10-24 NOTE — Progress Notes (Signed)
Xray, EKG read and patient discussed with Ms Debbra Riding. Agree with assessment and plan of care per her note.

## 2012-10-25 ENCOUNTER — Observation Stay (HOSPITAL_COMMUNITY): Payer: 59

## 2012-10-25 ENCOUNTER — Encounter (HOSPITAL_COMMUNITY)
Admission: AD | Disposition: A | Discharge status: Home / Self Care | Payer: Self-pay | Source: Ambulatory Visit | Attending: Cardiovascular Disease

## 2012-10-25 HISTORY — PX: LEFT HEART CATHETERIZATION WITH CORONARY ANGIOGRAM: SHX5451

## 2012-10-25 LAB — PROTIME-INR
INR: 1.18 (ref 0.00–1.49)
Prothrombin Time: 14.8 seconds (ref 11.6–15.2)

## 2012-10-25 LAB — CBC
MCV: 79.6 fL (ref 78.0–100.0)
Platelets: 271 10*3/uL (ref 150–400)
RBC: 4.26 MIL/uL (ref 3.87–5.11)
RDW: 14.2 % (ref 11.5–15.5)
WBC: 13.8 10*3/uL — ABNORMAL HIGH (ref 4.0–10.5)

## 2012-10-25 LAB — BASIC METABOLIC PANEL
BUN: 20 mg/dL (ref 6–23)
Chloride: 96 mEq/L (ref 96–112)
GFR calc Af Amer: 50 mL/min — ABNORMAL LOW (ref >90)
GFR calc non Af Amer: 43 mL/min — ABNORMAL LOW (ref >90)
Potassium: 3.5 mEq/L (ref 3.5–5.1)
Sodium: 136 mEq/L (ref 135–145)

## 2012-10-25 LAB — TROPONIN I
Troponin I: <0.3 ng/mL (ref <0.30)
Troponin I: <0.3 ng/mL (ref <0.30)

## 2012-10-25 LAB — GLUCOSE, CAPILLARY
Glucose-Capillary: 162 mg/dL — ABNORMAL HIGH (ref 70–99)
Glucose-Capillary: 175 mg/dL — ABNORMAL HIGH (ref 70–99)
Glucose-Capillary: 154 mg/dL — ABNORMAL HIGH (ref 70–99)

## 2012-10-25 LAB — LIPID PANEL
Cholesterol: 173 mg/dL (ref 0–200)
Triglycerides: 89 mg/dL (ref <150)
VLDL: 18 mg/dL (ref 0–40)

## 2012-10-25 LAB — HEMOGLOBIN A1C: Hgb A1c MFr Bld: 6.2 % — ABNORMAL HIGH (ref <5.7)

## 2012-10-25 SURGERY — LEFT HEART CATHETERIZATION WITH CORONARY ANGIOGRAM
Anesthesia: Moderate Sedation

## 2012-10-25 MED ORDER — MIDAZOLAM HCL 2 MG/2ML IJ SOLN
INTRAMUSCULAR | Status: AC
  Filled 2012-10-25: qty 2

## 2012-10-25 MED ORDER — METHYLPREDNISOLONE SODIUM SUCC 125 MG IJ SOLR
125.0000 mg | Freq: Once | INTRAMUSCULAR | Status: AC
  Administered 2012-10-25: 125 mg via INTRAVENOUS
  Filled 2012-10-25: qty 2

## 2012-10-25 MED ORDER — SODIUM CHLORIDE 0.9 % IJ SOLN
3.0000 mL | Freq: Two times a day (BID) | INTRAMUSCULAR | Status: DC
  Administered 2012-10-25: 3 mL via INTRAVENOUS

## 2012-10-25 MED ORDER — LIDOCAINE HCL (PF) 1 % IJ SOLN
INTRAMUSCULAR | Status: AC
  Filled 2012-10-25: qty 30

## 2012-10-25 MED ORDER — SODIUM CHLORIDE 0.9 % IV SOLN: 250.0000 mL | INTRAVENOUS | Status: DC | PRN

## 2012-10-25 MED ORDER — DIAZEPAM 5 MG PO TABS
5.0000 mg | ORAL_TABLET | ORAL | Status: AC
  Administered 2012-10-25: 5 mg via ORAL
  Filled 2012-10-25: qty 1

## 2012-10-25 MED ORDER — MORPHINE SULFATE 10 MG/ML IJ SOLN
INTRAMUSCULAR | Status: AC
  Filled 2012-10-25: qty 1

## 2012-10-25 MED ORDER — METOPROLOL TARTRATE 1 MG/ML IV SOLN
INTRAVENOUS | Status: AC
  Filled 2012-10-25: qty 5

## 2012-10-25 MED ORDER — HYDROCODONE-ACETAMINOPHEN 5-325 MG PO TABS: 1.0000 | ORAL_TABLET | Freq: Every evening | ORAL | Status: DC | PRN

## 2012-10-25 MED ORDER — SODIUM CHLORIDE 0.9 % IJ SOLN: 3.0000 mL | INTRAMUSCULAR | Status: DC | PRN

## 2012-10-25 MED ORDER — FENTANYL CITRATE 0.05 MG/ML IJ SOLN
INTRAMUSCULAR | Status: AC
  Filled 2012-10-25: qty 2

## 2012-10-25 MED ORDER — SODIUM CHLORIDE 0.9 % IV SOLN: 1.0000 mL/kg/h | INTRAVENOUS | Status: AC

## 2012-10-25 MED ORDER — HEPARIN (PORCINE) IN NACL 2-0.9 UNIT/ML-% IJ SOLN
INTRAMUSCULAR | Status: AC
  Filled 2012-10-25: qty 1000

## 2012-10-25 MED ORDER — DIPHENHYDRAMINE HCL 50 MG/ML IJ SOLN
25.0000 mg | Freq: Once | INTRAMUSCULAR | Status: AC
  Administered 2012-10-25: 25 mg via INTRAVENOUS
  Filled 2012-10-25: qty 1

## 2012-10-25 MED ORDER — FAMOTIDINE 20 MG PO TABS
20.0000 mg | ORAL_TABLET | Freq: Once | ORAL | Status: AC
  Administered 2012-10-25: 20 mg via ORAL
  Filled 2012-10-25: qty 1

## 2012-10-25 NOTE — Progress Notes (Signed)
Around 0033 pt still c/o 10/10 CP & SOB. VS stable bp 128/89, hr 79, r 22, 96% 2L O2 via [place]. MD on call notified. MD coming to bedside & CXR ordered will continue to monitor the pt. Sanda Linger

## 2012-10-25 NOTE — Progress Notes (Signed)
Pt still c/o 8/10 cp MD aware. Pt medicated will continue to monitor the pt. No new orders given this time. Cynthia Cameron

## 2012-10-25 NOTE — CV Procedure (Signed)
PROCEDURE:  Left heart catheterization with selective coronary angiography, left ventriculogram.  CLINICAL HISTORY:  This is a 52 years female with severe recurrent chest pain with shortness of breath. Cardiac risk factor of DM, II, Hypertension.   The risks, benefits, and details of the procedure were explained to the patient.  The patient verbalized understanding and wanted to proceed.  Informed written consent was obtained.  PROCEDURE TECHNIQUE:  The patient was approached from the right femoral artery using a 5 French short sheath.  Left coronary angiography was done using a Judkins L4 guide catheter.  Right coronary angiography was done using a Judkins R4 guide catheter.     CONTRAST:  Total of 30 cc.  COMPLICATIONS:  None.  At the end of the procedure a manual pressure device was used for hemostasis.    HEMODYNAMICS:  Aortic pressure was 139/99; LV pressure was 138/12; LVEDP 18.  There was no gradient between the left ventricle and aorta.    ANGIOGRAM/CORONARY ARTERIOGRAM:   The left main coronary artery is short and unremarkable.  The left anterior descending artery is unremarkable and supplied distal 1/3 of posterior wall.  The left circumflex artery is unremarkable, OM is mildly ectatic without significant disease.  The right coronary artery is dominant and has mild luminal irregularities in proximal 1/3 vessel otherwise unremarkable.  LEFT VENTRICULOGRAM:  Left ventricular angiogram was not done Normal left ventricular wall motion and systolic function with an estimated ejection fraction of 70% by previous echocardiogram.  LVEDP was 18 mmHg.  IMPRESSION OF HEART CATHETERIZATION:   1. Normal left main coronary artery. 2. Normal left anterior descending artery and its branches. 3. Normal left circumflex artery and its branches. 4. Minimal right coronary artery disease. 5. Normal left ventricular systolic function by echocardiogram.  LVEDP 18 mmHg.  Ejection fraction 70  %.  RECOMMENDATION:   Medical treatment.

## 2012-10-25 NOTE — Progress Notes (Signed)
Physician Discharge Summary  Patient ID: Cynthia Cameron MRN: [id number] DOB/AGE: 1960/11/10 52 y.o.  Admit date: 10/24/2012 Discharge date: 10/25/2012  Admission Diagnoses: Chest pain  Hypertension  DM, II  Discharge Diagnoses:  Principle Problem: * Non cardiac chest pain*  Hypertension  DM, II Anxiety Chronic kidney disease, stage II  Discharged Condition: good  Hospital Course: 52 years old with risk factor of DM, II and Hypertension had recurrent chest pain described as "bricks on my chest". She underwent cardiac catheterization that did not show significant coronary stenosis. Her elevated blood pressure responded to home medications and valium. She was discharged home in stable condition.  Consults: None  Significant Diagnostic Studies: labs: Na-143, K+-3.5, BUN-24 and Cr-1.48. Normal CBC and cardiac enzymes.  Coronary angiography showed early coronary artery disease without significant stenosis.  Treatments: analgesia: Vicodin and Morphine and cardiac meds: lisinopril (Prinivil).  Discharge Exam: Blood pressure 129/87, pulse 98, temperature 98.5 F (36.9 C), temperature source Oral, resp. rate 20, height 5\' 4"  (1.626 m), weight 79.516 kg (175 lb 4.8 oz), last menstrual period 09/16/2010, SpO2 92.00%.  Constitutional: She appears well-developed and well-nourished. No distress.  HENT: Head: Normocephalic and atraumatic. Eyes: Manson Passey, Conjunctivae are normal. No scleral icterus.  Neck: supple. FROM  Cardiovascular: Normal rate, regular rhythm and normal heart sounds. Exam reveals no gallop and no friction rub. II/VI systolic murmur heard.  Pulmonary/Chest: Effort normal and breath sounds normal. She has no wheezes. She has no rales.  Some right chest tenderness on palpation.  Abdominal: Soft. Bowel sounds are normal. There is no tenderness.  Neurological: She is alert and oriented to person, place, and time.  Skin: Skin is warm and dry.  Psychiatric: She has a normal  mood and affect. Her behavior is normal.   Disposition: Home   Future Appointments Provider Department Dept Phone   10/28/2012 10:20 AM [facility] [place]  IMAGING [phone]   Patient should wear two piece clothing and wear no powder or deodorant. Patient should arrive 15 minutes early.       Medication List    STOP taking these medications       metFORMIN 500 MG tablet  Commonly known as:  GLUCOPHAGE      TAKE these medications       buPROPion 100 MG tablet  Commonly known as:  WELLBUTRIN  Take 50 mg by mouth daily.     cloNIDine 0.2 MG tablet  Commonly known as:  CATAPRES  Take 0.2 mg by mouth at bedtime.     cyanocobalamin 1000 MCG tablet  Take 1,000 mcg by mouth daily.     ferrous sulfate 325 (65 FE) MG tablet  Take 325 mg by mouth daily with breakfast.     glipiZIDE 5 MG tablet  Commonly known as:  GLUCOTROL  Take 2.5 mg by mouth daily. Start with one pill in the morning.  May increase to twice daily if directed.     HYDROcodone-acetaminophen 5-325 MG per tablet  Commonly known as:  NORCO/VICODIN  Take 1 tablet by mouth at bedtime as needed for pain.     lisinopril 20 MG tablet  Commonly known as:  PRINIVIL,ZESTRIL  Take 20 mg by mouth daily.     multivitamin tablet  Take 1 tablet by mouth daily.     nebivolol 5 MG tablet  Commonly known as:  BYSTOLIC  Take 5 mg by mouth daily.     potassium chloride 10 MEQ tablet  Commonly known as:  K-DUR,KLOR-CON  Take 20 mEq by mouth 2 (two) times daily.     pravastatin 40 MG tablet  Commonly known as:  PRAVACHOL  Take 40 mg by mouth daily.           Follow-up Information   Follow up with COPLAND,JESSICA, MD In 2 weeks.   Contact information:   [address] [place] [address] [phone]       Follow up with [facility] S, MD. Schedule an appointment as soon as possible for a visit in 1 week.   Contact information:   [address] [place]  [address] [phone]       Signed: Ricki Rodriguez 10/25/2012, 6:40 PM

## 2012-10-25 NOTE — Progress Notes (Addendum)
MD notified around 2337 of pt having chest pain rated 10/10 radiating to neck & increase in SOB. Pt given 0.25 mg of xanax. VS bp 162/114, hr 87, r 20, 99% 2L O2 via [place], t-98.3. Will continue to monitor the pt. Cynthia Cameron

## 2012-10-25 NOTE — Progress Notes (Signed)
Utilization review completed.  

## 2012-10-25 NOTE — Progress Notes (Signed)
ANTICOAGULATION CONSULT NOTE - Follow Up Consult  Pharmacy Consult for heparin Indication: chest pain/ACS  Labs:  Recent Labs  10/24/12 1756 10/24/12 2334 10/25/12 0200  HGB 12.7  --  12.1  HCT 36.4  --  33.9*  PLT 291  --  271  APTT 35  --   --   LABPROT 13.7  --  14.8  INR 1.06  --  1.18  HEPARINUNFRC  --   --  0.40  CREATININE 1.48*  --  1.38*  TROPONINI <0.30 <0.30  --     Assessment/Plan:  52yo female therapeutic on heparin with initial dosing for CP.  Will continue gtt at current rate and confirm stable with additional level.  Colleen Can PharmD BCPS 10/25/2012,3:13 AM

## 2012-10-26 DIAGNOSIS — I82409 Acute embolism and thrombosis of unspecified deep veins of unspecified lower extremity: Secondary | ICD-10-CM

## 2012-10-26 HISTORY — DX: Acute embolism and thrombosis of unspecified deep veins of unspecified lower extremity: I82.409

## 2012-10-26 NOTE — Discharge Summary (Addendum)
Physician Discharge Summary  Patient ID: Cynthia Cameron MRN: [id number] DOB/AGE: 10/09/1960 52 y.o.  Admit date: 10/24/2012 Discharge date: 10/25/2012  Admission Diagnoses: Chest pain  Hypertension  DM, II  Discharge Diagnoses:  Principle Problem: * Non cardiac chest pain*  Hypertension  DM, II Anxiety Chronic kidney disease, stage II  Discharged Condition: good  Hospital Course: 52 years old with risk factor of DM, II and Hypertension had recurrent chest pain described as "bricks on my chest". She underwent cardiac catheterization that did not show significant coronary stenosis. Her elevated blood pressure responded to home medications and valium. She was discharged home in stable condition.  Consults: None  Significant Diagnostic Studies: labs: Na-143, K+-3.5, BUN-24 and Cr-1.48. Normal CBC and cardiac enzymes.  Coronary angiography showed early coronary artery disease without significant stenosis.  Treatments: analgesia: Vicodin and Morphine and cardiac meds: lisinopril (Prinivil).  Discharge Exam: Blood pressure 129/87, pulse 98, temperature 98.5 F (36.9 C), temperature source Oral, resp. rate 20, height 5' 4" (1.626 m), weight 79.516 kg (175 lb 4.8 oz), last menstrual period 09/16/2010, SpO2 92.00%.  Constitutional: She appears well-developed and well-nourished. No distress.  HENT: Head: Normocephalic and atraumatic. Eyes: Brown, Conjunctivae are normal. No scleral icterus.  Neck: supple. FROM  Cardiovascular: Normal rate, regular rhythm and normal heart sounds. Exam reveals no gallop and no friction rub. II/VI systolic murmur heard.  Pulmonary/Chest: Effort normal and breath sounds normal. She has no wheezes. She has no rales.  Some right chest tenderness on palpation.  Abdominal: Soft. Bowel sounds are normal. There is no tenderness.  Neurological: She is alert and oriented to person, place, and time.  Skin: Skin is warm and dry.  Psychiatric: She has a normal  mood and affect. Her behavior is normal.   Disposition: Home   Future Appointments Provider Department Dept Phone   10/28/2012 10:20 AM [facility] [place]  IMAGING [phone]   Patient should wear two piece clothing and wear no powder or deodorant. Patient should arrive 15 minutes early.       Medication List    STOP taking these medications       metFORMIN 500 MG tablet  Commonly known as:  GLUCOPHAGE      TAKE these medications       buPROPion 100 MG tablet  Commonly known as:  WELLBUTRIN  Take 50 mg by mouth daily.     cloNIDine 0.2 MG tablet  Commonly known as:  CATAPRES  Take 0.2 mg by mouth at bedtime.     cyanocobalamin 1000 MCG tablet  Take 1,000 mcg by mouth daily.     ferrous sulfate 325 (65 FE) MG tablet  Take 325 mg by mouth daily with breakfast.     glipiZIDE 5 MG tablet  Commonly known as:  GLUCOTROL  Take 2.5 mg by mouth daily. Start with one pill in the morning.  May increase to twice daily if directed.     HYDROcodone-acetaminophen 5-325 MG per tablet  Commonly known as:  NORCO/VICODIN  Take 1 tablet by mouth at bedtime as needed for pain.     lisinopril 20 MG tablet  Commonly known as:  PRINIVIL,ZESTRIL  Take 20 mg by mouth daily.     multivitamin tablet  Take 1 tablet by mouth daily.     nebivolol 5 MG tablet  Commonly known as:  BYSTOLIC  Take 5 mg by mouth daily.     potassium chloride 10 MEQ tablet  Commonly known as:    K-DUR,KLOR-CON  Take 20 mEq by mouth 2 (two) times daily.     pravastatin 40 MG tablet  Commonly known as:  PRAVACHOL  Take 40 mg by mouth daily.           Follow-up Information   Follow up with COPLAND,JESSICA, MD In 2 weeks.   Contact information:   [address] [place] [address] [phone]       Follow up with Caydon Feasel S, MD. Schedule an appointment as soon as possible for a visit in 1 week.   Contact information:   [address] [place]  [address] [phone]       Signed: Darien Mignogna S 10/25/2012, 6:40 PM    

## 2012-10-28 ENCOUNTER — Ambulatory Visit
Admission: RE | Admit: 2012-10-28 | Discharge: 2012-10-28 | Disposition: A | Discharge status: Home / Self Care | Payer: 59 | Source: Ambulatory Visit | Attending: Obstetrics and Gynecology | Admitting: Obstetrics and Gynecology

## 2012-10-28 DIAGNOSIS — Z1231 Encounter for screening mammogram for malignant neoplasm of breast: Secondary | ICD-10-CM

## 2012-11-03 ENCOUNTER — Inpatient Hospital Stay (HOSPITAL_COMMUNITY)
Admission: EM | Admit: 2012-11-03 | Discharge: 2012-11-13 | DRG: 175 | Disposition: A | Discharge status: Home / Self Care | Payer: 59 | Attending: Family Medicine | Admitting: Family Medicine

## 2012-11-03 ENCOUNTER — Encounter (HOSPITAL_COMMUNITY): Payer: Self-pay | Admitting: Emergency Medicine

## 2012-11-03 ENCOUNTER — Ambulatory Visit (INDEPENDENT_AMBULATORY_CARE_PROVIDER_SITE_OTHER): Payer: 59 | Admitting: Family Medicine

## 2012-11-03 ENCOUNTER — Ambulatory Visit: Payer: 59

## 2012-11-03 VITALS — BP 133/100 | HR 81 | Temp 98.5°F | Resp 18 | Wt 172.0 lb

## 2012-11-03 DIAGNOSIS — I3139 Other pericardial effusion (noninflammatory): Secondary | ICD-10-CM

## 2012-11-03 DIAGNOSIS — N179 Acute kidney failure, unspecified: Secondary | ICD-10-CM | POA: Diagnosis present

## 2012-11-03 DIAGNOSIS — I10 Essential (primary) hypertension: Secondary | ICD-10-CM

## 2012-11-03 DIAGNOSIS — F411 Generalized anxiety disorder: Secondary | ICD-10-CM | POA: Diagnosis not present

## 2012-11-03 DIAGNOSIS — Y95 Nosocomial condition: Secondary | ICD-10-CM

## 2012-11-03 DIAGNOSIS — E785 Hyperlipidemia, unspecified: Secondary | ICD-10-CM | POA: Diagnosis present

## 2012-11-03 DIAGNOSIS — K5900 Constipation, unspecified: Secondary | ICD-10-CM | POA: Diagnosis not present

## 2012-11-03 DIAGNOSIS — R0689 Other abnormalities of breathing: Secondary | ICD-10-CM

## 2012-11-03 DIAGNOSIS — I82409 Acute embolism and thrombosis of unspecified deep veins of unspecified lower extremity: Secondary | ICD-10-CM

## 2012-11-03 DIAGNOSIS — F32A Depression, unspecified: Secondary | ICD-10-CM

## 2012-11-03 DIAGNOSIS — I313 Pericardial effusion (noninflammatory): Secondary | ICD-10-CM | POA: Diagnosis present

## 2012-11-03 DIAGNOSIS — N182 Chronic kidney disease, stage 2 (mild): Secondary | ICD-10-CM | POA: Diagnosis present

## 2012-11-03 DIAGNOSIS — J948 Other specified pleural conditions: Secondary | ICD-10-CM

## 2012-11-03 DIAGNOSIS — I319 Disease of pericardium, unspecified: Secondary | ICD-10-CM | POA: Diagnosis present

## 2012-11-03 DIAGNOSIS — E11649 Type 2 diabetes mellitus with hypoglycemia without coma: Secondary | ICD-10-CM

## 2012-11-03 DIAGNOSIS — I2699 Other pulmonary embolism without acute cor pulmonale: Secondary | ICD-10-CM

## 2012-11-03 DIAGNOSIS — I129 Hypertensive chronic kidney disease with stage 1 through stage 4 chronic kidney disease, or unspecified chronic kidney disease: Secondary | ICD-10-CM | POA: Diagnosis present

## 2012-11-03 DIAGNOSIS — J90 Pleural effusion, not elsewhere classified: Secondary | ICD-10-CM

## 2012-11-03 DIAGNOSIS — J189 Pneumonia, unspecified organism: Secondary | ICD-10-CM

## 2012-11-03 DIAGNOSIS — E1169 Type 2 diabetes mellitus with other specified complication: Secondary | ICD-10-CM | POA: Diagnosis present

## 2012-11-03 DIAGNOSIS — F329 Major depressive disorder, single episode, unspecified: Secondary | ICD-10-CM | POA: Diagnosis present

## 2012-11-03 DIAGNOSIS — F3289 Other specified depressive episodes: Secondary | ICD-10-CM | POA: Diagnosis present

## 2012-11-03 DIAGNOSIS — Z79899 Other long term (current) drug therapy: Secondary | ICD-10-CM

## 2012-11-03 DIAGNOSIS — I824Z9 Acute embolism and thrombosis of unspecified deep veins of unspecified distal lower extremity: Secondary | ICD-10-CM | POA: Diagnosis present

## 2012-11-03 DIAGNOSIS — R918 Other nonspecific abnormal finding of lung field: Secondary | ICD-10-CM

## 2012-11-03 DIAGNOSIS — E119 Type 2 diabetes mellitus without complications: Secondary | ICD-10-CM

## 2012-11-03 DIAGNOSIS — R0609 Other forms of dyspnea: Secondary | ICD-10-CM

## 2012-11-03 DIAGNOSIS — R0600 Dyspnea, unspecified: Secondary | ICD-10-CM | POA: Diagnosis present

## 2012-11-03 DIAGNOSIS — R091 Pleurisy: Secondary | ICD-10-CM

## 2012-11-03 HISTORY — DX: Pneumonia, unspecified organism: J18.9

## 2012-11-03 LAB — BASIC METABOLIC PANEL
BUN: 26 mg/dL — ABNORMAL HIGH (ref 6–23)
Chloride: 98 mEq/L (ref 96–112)
GFR calc Af Amer: 38 mL/min — ABNORMAL LOW (ref >90)
Potassium: 4.4 mEq/L (ref 3.5–5.1)
Sodium: 135 mEq/L (ref 135–145)

## 2012-11-03 LAB — CBC WITH DIFFERENTIAL/PLATELET
Basophils Absolute: 0 10*3/uL (ref 0.0–0.1)
Basophils Relative: 0 % (ref 0–1)
Hemoglobin: 11.7 g/dL — ABNORMAL LOW (ref 12.0–15.0)
MCHC: 33.9 g/dL (ref 30.0–36.0)
Monocytes Relative: 6 % (ref 3–12)
Neutro Abs: 9.3 10*3/uL — ABNORMAL HIGH (ref 1.7–7.7)
Neutrophils Relative %: 73 % (ref 43–77)
Platelets: 410 10*3/uL — ABNORMAL HIGH (ref 150–400)

## 2012-11-03 LAB — GLUCOSE, POCT (MANUAL RESULT ENTRY): POC Glucose: 119 mg/dl — AB (ref 70–99)

## 2012-11-03 LAB — POCT CBC
Hemoglobin: 11.6 g/dL — AB (ref 12.2–16.2)
MCHC: 31.5 g/dL — AB (ref 31.8–35.4)
MPV: 6.8 fL (ref 0–99.8)
POC Granulocyte: 9.8 — AB (ref 2–6.9)
POC MID %: 5.8 %M (ref 0–12)
Platelet Count, POC: 497 10*3/uL — AB (ref 142–424)
RBC: 4.4 M/uL (ref 4.04–5.48)

## 2012-11-03 LAB — TROPONIN I: Troponin I: <0.3 ng/mL (ref <0.30)

## 2012-11-03 MED ORDER — ALBUTEROL SULFATE (2.5 MG/3ML) 0.083% IN NEBU
2.5000 mg | INHALATION_SOLUTION | Freq: Once | RESPIRATORY_TRACT | Status: AC
  Administered 2012-11-03: 2.5 mg via RESPIRATORY_TRACT

## 2012-11-03 MED ORDER — ACETAMINOPHEN 325 MG PO TABS
650.0000 mg | ORAL_TABLET | Freq: Once | ORAL | Status: AC
  Administered 2012-11-03: 650 mg via ORAL
  Filled 2012-11-03: qty 2

## 2012-11-03 MED ORDER — CEFTRIAXONE SODIUM 1 G IJ SOLR
1.0000 g | INTRAMUSCULAR | Status: DC
  Administered 2012-11-03: 1 g via INTRAMUSCULAR

## 2012-11-03 NOTE — Progress Notes (Signed)
Subjective:    Patient ID: Cynthia Cameron, female    DOB: 12-06-1960, 52 y.o.   MRN: [id number] Chief Complaint  Patient presents with  . Chest Pain    HPI  Cynthia Cameron has been having chest pain and shortness of breath for the past 2 wks. She was seen here about 10d ago with an abnormal EKG so saw her cardiologist who admitted her for a cardiac catheterization which was normal per pt.  She had a repeat CXR on the day of her heart cath which was concerning for some developing bilateral infiltrate and her wbc were mildly elevated.  Pt was told that her chest pain was not due to her heart and sent home w/ a rx for norco which she has not yet filled.  She has continued to have severe chest pain, shortness of breath, and exhaustion. Though no f/c and does not feel ill. No URI sxs, NO COUGH.  CP much worse with deep breathing. No sig GERD sxs or indigestion. Had a cbg this morning of 29!!! So she did NOT take her diabetic medication today.  She immed took 3 tblspns of syrup and within 15 min cbg up to 140 but then dropped back down to 85 - she doesn't know why it is so low.  Past Medical History  Diagnosis Date  . Allergy   . Depression   . Diabetes mellitus without complication   . Hypertension    Current Outpatient Prescriptions on File Prior to Visit  Medication Sig Dispense Refill  . buPROPion (WELLBUTRIN) 100 MG tablet Take 50 mg by mouth daily.       . cloNIDine (CATAPRES) 0.2 MG tablet Take 0.2 mg by mouth at bedtime.      . cyanocobalamin 1000 MCG tablet Take 1,000 mcg by mouth daily.       . ferrous sulfate 325 (65 FE) MG tablet Take 325 mg by mouth daily with breakfast.      . glipiZIDE (GLUCOTROL) 5 MG tablet Take 2.5 mg by mouth daily. Start with one pill in the morning.  May increase to twice daily if directed.      Marland Kitchen lisinopril (PRINIVIL,ZESTRIL) 20 MG tablet Take 20 mg by mouth daily.      . Multiple Vitamin (MULTIVITAMIN) tablet Take 1 tablet by mouth daily.      .  nebivolol (BYSTOLIC) 5 MG tablet Take 5 mg by mouth daily.      . potassium chloride (K-DUR,KLOR-CON) 10 MEQ tablet Take 20 mEq by mouth 2 (two) times daily.       . pravastatin (PRAVACHOL) 40 MG tablet Take 40 mg by mouth daily.      Marland Kitchen HYDROcodone-acetaminophen (NORCO/VICODIN) 5-325 MG per tablet Take 1 tablet by mouth at bedtime as needed for pain.  30 tablet  0   No current facility-administered medications on file prior to visit.   No Known Allergies    Review of Systems  Constitutional: Positive for fatigue. Negative for fever.  HENT: Negative for ear pain, congestion, sore throat, rhinorrhea, trouble swallowing, voice change and sinus pressure.   Respiratory: Positive for chest tightness and shortness of breath. Negative for cough, choking, wheezing and stridor.   Cardiovascular: Positive for chest pain. Negative for leg swelling.  Musculoskeletal: Positive for arthralgias.       Left hand starting to ache  Skin: Negative for rash.  Neurological: Positive for weakness. Negative for numbness.  Hematological: Negative for adenopathy. Does not bruise/bleed easily.  BP 133/100  Pulse 81  Temp(Src) 98.5 F (36.9 C) (Oral)  Resp 18  Wt 172 lb (78.019 kg)  BMI 29.51 kg/m2  SpO2 93%  LMP 09/16/2010 Objective:   Physical Exam  Constitutional: She is oriented to person, place, and time. She appears well-developed and well-nourished. She appears lethargic. She appears ill. No distress.  HENT:  Head: Normocephalic and atraumatic.  Right Ear: External ear normal.  Left Ear: External ear normal.  Eyes: Conjunctivae are normal. No scleral icterus.  Neck: Normal range of motion. Neck supple. No thyromegaly present.  Cardiovascular: Normal rate, regular rhythm, normal heart sounds and intact distal pulses.   Pulmonary/Chest: No accessory muscle usage. Tachypnea noted. No respiratory distress. She has decreased breath sounds in the left lower field. She has no wheezes. She has no  rhonchi. She has no rales. She exhibits tenderness. She exhibits no crepitus.  Musculoskeletal: She exhibits no edema.  Lymphadenopathy:    She has no cervical adenopathy.  Neurological: She is oriented to person, place, and time. She appears lethargic.  Skin: Skin is warm and dry. She is not diaphoretic. No erythema.  Psychiatric: She has a normal mood and affect. Her behavior is normal.     Results for orders placed in visit on 11/03/12  POCT CBC      Result Value Range   WBC 12.9 (*) 4.6 - 10.2 K/uL   Lymph, poc 2.4  0.6 - 3.4   POC LYMPH PERCENT 18.6  10 - 50 %L   MID (cbc) 0.7  0 - 0.9   POC MID % 5.8  0 - 12 %M   POC Granulocyte 9.8 (*) 2 - 6.9   Granulocyte percent 75.6  37 - 80 %G   RBC 4.40  4.04 - 5.48 M/uL   Hemoglobin 11.6 (*) 12.2 - 16.2 g/dL   HCT, POC 81.1 (*) 91.4 - 47.9 %   MCV 83.6  80 - 97 fL   MCH, POC 26.4 (*) 27 - 31.2 pg   MCHC 31.5 (*) 31.8 - 35.4 g/dL   RDW, POC 78.2     Platelet Count, POC 497 (*) 142 - 424 K/uL   MPV 6.8  0 - 99.8 fL  GLUCOSE, POCT (MANUAL RESULT ENTRY)      Result Value Range   POC Glucose 119 (*) 70 - 99 mg/dl       [facility] reading (PRIMARY) by  Dr. Clelia Croft. Large left lower lobe infiltrate  Assessment & Plan:  Difficulty breathing - Plan: POCT CBC, POCT glucose (manual entry), DG Chest 2 View, albuterol (PROVENTIL) (2.5 MG/3ML) 0.083% nebulizer solution 2.5 mg, cefTRIAXone (ROCEPHIN) injection 1 g IM x 1 now (6 p.m.)   Pt also given lortab elixir 15 mL (=7.5mg  hydrocodone) to help with the chest pain x 1 po in office now. Hypoglycemia associated with diabetes  Pulmonary infiltrate in left lung on chest x-ray  HAP (hospital-acquired pneumonia)  Meds ordered this encounter  Medications  . albuterol (PROVENTIL) (2.5 MG/3ML) 0.083% nebulizer solution 2.5 mg    Sig:   . cefTRIAXone (ROCEPHIN) injection 1 g    Sig:    Pt sent to ER - son will drive her - charge nurse informed. Pt given AVS with highlighted instructions as to why  I am sending her for her to give to ER triage and stat over-read on CXR ordered so it will be in Epic when pt arrives at [facility] ER

## 2012-11-03 NOTE — Patient Instructions (Addendum)
You were given 1g of Rocephin IM for treatment of your pneumonia at 6 p.m. after an albuterol nebulizer treatment which improved your oxygen levels from 93% to 95%.  Because you have likely had this pneumonia building up in your left lung for about 2 weeks, I recommend that you be evaluated at the ER.  Also, this infection may make your sugars very unstable - explaining your blood sugar of 29 this morning - so we should have you closely monitored until you are stable.  We have called the ER to let them know that you are on your way.  Pneumonia, Adult Pneumonia is an infection of the lungs.  CAUSES Pneumonia may be caused by bacteria or a virus. Usually, these infections are caused by breathing infectious particles into the lungs (respiratory tract). SYMPTOMS   Cough.  Fever.  Chest pain.  Increased rate of breathing.  Wheezing.  Mucus production. DIAGNOSIS  If you have the common symptoms of pneumonia, your caregiver will typically confirm the diagnosis with a chest X-ray. The X-ray will show an abnormality in the lung (pulmonary infiltrate) if you have pneumonia. Other tests of your blood, urine, or sputum may be done to find the specific cause of your pneumonia. Your caregiver may also do tests (blood gases or pulse oximetry) to see how well your lungs are working. TREATMENT  Some forms of pneumonia may be spread to other people when you cough or sneeze. You may be asked to wear a mask before and during your exam. Pneumonia that is caused by bacteria is treated with antibiotic medicine. Pneumonia that is caused by the influenza virus may be treated with an antiviral medicine. Most other viral infections must run their course. These infections will not respond to antibiotics.  PREVENTION A pneumococcal shot (vaccine) is available to prevent a common bacterial cause of pneumonia. This is usually suggested for:  People over 71 years old.  Patients on chemotherapy.  People with chronic  lung problems, such as bronchitis or emphysema.  People with immune system problems. If you are over 65 or have a high risk condition, you may receive the pneumococcal vaccine if you have not received it before. In some countries, a routine influenza vaccine is also recommended. This vaccine can help prevent some cases of pneumonia.You may be offered the influenza vaccine as part of your care. If you smoke, it is time to quit. You may receive instructions on how to stop smoking. Your caregiver can provide medicines and counseling to help you quit. HOME CARE INSTRUCTIONS   Cough suppressants may be used if you are losing too much rest. However, coughing protects you by clearing your lungs. You should avoid using cough suppressants if you can.  Your caregiver may have prescribed medicine if he or she thinks your pneumonia is caused by a bacteria or influenza. Finish your medicine even if you start to feel better.  Your caregiver may also prescribe an expectorant. This loosens the mucus to be coughed up.  Only take over-the-counter or prescription medicines for pain, discomfort, or fever as directed by your caregiver.  Do not smoke. Smoking is a common cause of bronchitis and can contribute to pneumonia. If you are a smoker and continue to smoke, your cough may last several weeks after your pneumonia has cleared.  A cold steam vaporizer or humidifier in your room or home may help loosen mucus.  Coughing is often worse at night. Sleeping in a semi-upright position in a recliner or using  a couple pillows under your head will help with this.  Get rest as you feel it is needed. Your body will usually let you know when you need to rest. SEEK IMMEDIATE MEDICAL CARE IF:   Your illness becomes worse. This is especially true if you are elderly or weakened from any other disease.  You cannot control your cough with suppressants and are losing sleep.  You begin coughing up blood.  You develop pain  which is getting worse or is uncontrolled with medicines.  You have a fever.  Any of the symptoms which initially brought you in for treatment are getting worse rather than better.  You develop shortness of breath or chest pain. MAKE SURE YOU:   Understand these instructions.  Will watch your condition.  Will get help right away if you are not doing well or get worse. Document Released: 08/14/2005 Document Revised: 11/06/2011 Document Reviewed: 11/03/2010 [facility] Patient Information 2013 [place], [place].

## 2012-11-03 NOTE — ED Provider Notes (Signed)
History     CSN: [id number]  Arrival date & time 11/03/12  1859   First MD Initiated Contact with Patient 11/03/12 2222      Chief Complaint  Patient presents with  . Pneumonia    (Consider location/radiation/quality/duration/timing/severity/associated sxs/prior treatment) HPI Comments: 52 year old female, PMH of DM, HTN, and depression who presents to the ED with a chief complaint of chest pain.  Patient was seen at [facility] and CXR reveals pneumonia.  Patient received Rocephin at [facility].  Patient's blood sugar dropped to 29 earlier today.  She denies fever.  Prior to the Rocephin, she has not tried anything for her symptoms.  Her pain does not radiate.   The history is provided by the patient. No language interpreter was used.    Past Medical History  Diagnosis Date  . Allergy   . Depression   . Diabetes mellitus without complication   . Hypertension     History reviewed. No pertinent past surgical history.  History reviewed. No pertinent family history.  History  Substance Use Topics  . Smoking status: Never Smoker   . Smokeless tobacco: Not on file  . Alcohol Use: Yes     Comment: social    OB History   Grav Para Term Preterm Abortions TAB SAB Ect Mult Living                  Review of Systems  All other systems reviewed and are negative.    Allergies  Review of patient's allergies indicates no known allergies.  Home Medications   Current Outpatient Rx  Name  Route  Sig  Dispense  Refill  . buPROPion (WELLBUTRIN) 100 MG tablet   Oral   Take 50 mg by mouth daily.          . cloNIDine (CATAPRES) 0.2 MG tablet   Oral   Take 0.2 mg by mouth at bedtime.         . cyanocobalamin 1000 MCG tablet   Oral   Take 1,000 mcg by mouth daily.          . ferrous sulfate 325 (65 FE) MG tablet   Oral   Take 325 mg by mouth daily with breakfast.         . glipiZIDE (GLUCOTROL) 5 MG tablet   Oral   Take 2.5 mg by mouth daily. Start with one pill in the  morning.  May increase to twice daily if directed.         Marland Kitchen HYDROcodone-acetaminophen (NORCO/VICODIN) 5-325 MG per tablet   Oral   Take 1 tablet by mouth at bedtime as needed for pain.   30 tablet   0   . lisinopril (PRINIVIL,ZESTRIL) 20 MG tablet   Oral   Take 20 mg by mouth daily.         . Multiple Vitamin (MULTIVITAMIN) tablet   Oral   Take 1 tablet by mouth daily.         . nebivolol (BYSTOLIC) 5 MG tablet   Oral   Take 5 mg by mouth daily.         . potassium chloride (K-DUR,KLOR-CON) 10 MEQ tablet   Oral   Take 20 mEq by mouth 2 (two) times daily.          . pravastatin (PRAVACHOL) 40 MG tablet   Oral   Take 40 mg by mouth daily.           BP 135/95  Pulse 93  Temp(Src) 98.7 F (37.1 C) (Oral)  Resp 18  SpO2 93%  LMP 09/16/2010  Physical Exam  Nursing note and vitals reviewed. Constitutional: She is oriented to person, place, and time. She appears well-developed and well-nourished.  HENT:  Head: Normocephalic and atraumatic.  Eyes: Conjunctivae and EOM are normal. Pupils are equal, round, and reactive to light.  Neck: Normal range of motion. Neck supple.  Cardiovascular: Normal rate and regular rhythm.  Exam reveals no gallop and no friction rub.   No murmur heard. Pulmonary/Chest: Effort normal. No respiratory distress. She has no wheezes. She has rales. She exhibits no tenderness.  Abdominal: Soft. Bowel sounds are normal. She exhibits no distension and no mass. There is no tenderness. There is no rebound and no guarding.  Musculoskeletal: Normal range of motion. She exhibits no edema and no tenderness.  Neurological: She is alert and oriented to person, place, and time.  Skin: Skin is warm and dry.  Psychiatric: She has a normal mood and affect. Her behavior is normal. Judgment and thought content normal.    ED Course  Procedures (including critical care time)  Labs Reviewed  CBC WITH DIFFERENTIAL  BASIC METABOLIC PANEL  TROPONIN I    Dg Chest 2 View  11/03/2012  *RADIOLOGY REPORT*  Clinical Data: And shortness of breath.  CHEST - 2 VIEW  Comparison: PA and lateral chest 10/17/2012.  Findings: The patient has a small left pleural effusion and dense left basilar airspace disease.  Trace amount of pleural fluid on the right is noted with some streaky opacity, likely atelectasis. No pneumothorax is seen.  There is cardiomegaly.  IMPRESSION:  1.  Left effusion basilar airspace disease worrisome for pneumonia. Recommend follow-up plain films to clearing. 2.  Trace right pleural effusion. 3.  Cardiomegaly.   Original Report Authenticated By: Holley Dexter, M.D.    ED ECG REPORT  I personally interpreted this EKG   Date: 11/03/2012   Rate: 84  Rhythm: normal sinus rhythm  QRS Axis: normal  Intervals: normal  ST/T Wave abnormalities: nonspecific T wave changes  Conduction Disutrbances:none  Narrative Interpretation:   Old EKG Reviewed: none available     1. Pneumonia       MDM  Patient discussed with Dr. Anitra Lauth, who agrees that the patient needs to be admitted, as her sugars have been unstable and have dropped as low as 29.  I have consulted family medicine, who will see and admit the patient.  Patient understands and agrees with the plan.          Roxy Horseman, PA-C 11/04/12 [phone]

## 2012-11-03 NOTE — ED Notes (Signed)
Pt came from [facility] to be evaluated for pneumonia. Pt has had a productive cough with sputum yellow in color x 10days and chest pain. Pt states her chest hurts more when she lies down.

## 2012-11-03 NOTE — ED Notes (Signed)
Pt sent from [facility] for eval for left lower lobe PNA; pt sts productive painful cough

## 2012-11-03 NOTE — ED Notes (Signed)
[place] PA at bedside.  

## 2012-11-04 ENCOUNTER — Encounter (HOSPITAL_COMMUNITY): Payer: Self-pay | Admitting: Family Medicine

## 2012-11-04 ENCOUNTER — Inpatient Hospital Stay (HOSPITAL_COMMUNITY): Payer: 59

## 2012-11-04 DIAGNOSIS — E119 Type 2 diabetes mellitus without complications: Secondary | ICD-10-CM | POA: Diagnosis present

## 2012-11-04 DIAGNOSIS — I10 Essential (primary) hypertension: Secondary | ICD-10-CM | POA: Diagnosis present

## 2012-11-04 DIAGNOSIS — Y95 Nosocomial condition: Secondary | ICD-10-CM | POA: Diagnosis present

## 2012-11-04 DIAGNOSIS — F3289 Other specified depressive episodes: Secondary | ICD-10-CM

## 2012-11-04 DIAGNOSIS — J189 Pneumonia, unspecified organism: Secondary | ICD-10-CM | POA: Diagnosis present

## 2012-11-04 DIAGNOSIS — F32A Depression, unspecified: Secondary | ICD-10-CM | POA: Diagnosis present

## 2012-11-04 DIAGNOSIS — F329 Major depressive disorder, single episode, unspecified: Secondary | ICD-10-CM

## 2012-11-04 LAB — BASIC METABOLIC PANEL
BUN: 27 mg/dL — ABNORMAL HIGH (ref 6–23)
Chloride: 96 mEq/L (ref 96–112)
Creatinine, Ser: 1.75 mg/dL — ABNORMAL HIGH (ref 0.50–1.10)
GFR calc Af Amer: 37 mL/min — ABNORMAL LOW (ref >90)

## 2012-11-04 LAB — PRO B NATRIURETIC PEPTIDE: Pro B Natriuretic peptide (BNP): 319.2 pg/mL — ABNORMAL HIGH (ref 0–125)

## 2012-11-04 LAB — GLUCOSE, CAPILLARY
Glucose-Capillary: 110 mg/dL — ABNORMAL HIGH (ref 70–99)
Glucose-Capillary: 128 mg/dL — ABNORMAL HIGH (ref 70–99)
Glucose-Capillary: 149 mg/dL — ABNORMAL HIGH (ref 70–99)

## 2012-11-04 MED ORDER — POTASSIUM CHLORIDE IN NACL 20-0.9 MEQ/L-% IV SOLN
INTRAVENOUS | Status: AC
  Administered 2012-11-04: 06:00:00 via INTRAVENOUS
  Filled 2012-11-04: qty 1000

## 2012-11-04 MED ORDER — CEFTRIAXONE SODIUM 1 G IJ SOLR: 1.0000 g | INTRAMUSCULAR | Status: DC

## 2012-11-04 MED ORDER — MORPHINE SULFATE 2 MG/ML IJ SOLN
2.0000 mg | INTRAMUSCULAR | Status: DC | PRN
  Administered 2012-11-04 (×2): 2 mg via INTRAVENOUS
  Filled 2012-11-04 (×2): qty 1

## 2012-11-04 MED ORDER — ASPIRIN EC 81 MG PO TBEC
81.0000 mg | DELAYED_RELEASE_TABLET | Freq: Every day | ORAL | Status: DC
  Administered 2012-11-04 – 2012-11-13 (×10): 81 mg via ORAL
  Filled 2012-11-04 (×10): qty 1

## 2012-11-04 MED ORDER — INSULIN ASPART 100 UNIT/ML ~~LOC~~ SOLN
0.0000 [IU] | Freq: Every day | SUBCUTANEOUS | Status: DC
  Administered 2012-11-12: 2 [IU] via SUBCUTANEOUS

## 2012-11-04 MED ORDER — CLONIDINE HCL 0.2 MG PO TABS
0.2000 mg | ORAL_TABLET | Freq: Every day | ORAL | Status: DC
  Administered 2012-11-04: 0.2 mg via ORAL
  Filled 2012-11-04 (×2): qty 1

## 2012-11-04 MED ORDER — NEBIVOLOL HCL 5 MG PO TABS
5.0000 mg | ORAL_TABLET | Freq: Every day | ORAL | Status: DC
  Administered 2012-11-04 – 2012-11-13 (×10): 5 mg via ORAL
  Filled 2012-11-04 (×11): qty 1

## 2012-11-04 MED ORDER — SODIUM CHLORIDE 0.9 % IJ SOLN
3.0000 mL | Freq: Two times a day (BID) | INTRAMUSCULAR | Status: DC
  Administered 2012-11-04: 3 mL via INTRAVENOUS
  Administered 2012-11-05: 22:00:00 via INTRAVENOUS
  Administered 2012-11-06 – 2012-11-12 (×14): 3 mL via INTRAVENOUS

## 2012-11-04 MED ORDER — OXYCODONE-ACETAMINOPHEN 5-325 MG PO TABS
1.0000 | ORAL_TABLET | ORAL | Status: DC | PRN
  Administered 2012-11-04 – 2012-11-09 (×16): 2 via ORAL
  Administered 2012-11-10 – 2012-11-11 (×2): 1 via ORAL
  Administered 2012-11-12 – 2012-11-13 (×2): 2 via ORAL
  Filled 2012-11-04 (×3): qty 2
  Filled 2012-11-04 (×2): qty 1
  Filled 2012-11-04 (×15): qty 2

## 2012-11-04 MED ORDER — CEFTRIAXONE SODIUM 1 G IJ SOLR
1.0000 g | INTRAMUSCULAR | Status: DC
  Administered 2012-11-05: 1 g via INTRAMUSCULAR
  Filled 2012-11-04 (×3): qty 10

## 2012-11-04 MED ORDER — INSULIN ASPART 100 UNIT/ML ~~LOC~~ SOLN
0.0000 [IU] | Freq: Three times a day (TID) | SUBCUTANEOUS | Status: DC
  Administered 2012-11-05: 1 [IU] via SUBCUTANEOUS
  Administered 2012-11-05 (×2): 2 [IU] via SUBCUTANEOUS
  Administered 2012-11-06: 1 [IU] via SUBCUTANEOUS
  Administered 2012-11-06: 2 [IU] via SUBCUTANEOUS
  Administered 2012-11-06: 1 [IU] via SUBCUTANEOUS
  Administered 2012-11-07: 3 [IU] via SUBCUTANEOUS
  Administered 2012-11-07 – 2012-11-09 (×6): 2 [IU] via SUBCUTANEOUS
  Administered 2012-11-09: 1 [IU] via SUBCUTANEOUS
  Administered 2012-11-09 – 2012-11-10 (×4): 2 [IU] via SUBCUTANEOUS
  Administered 2012-11-11 – 2012-11-12 (×3): 1 [IU] via SUBCUTANEOUS
  Administered 2012-11-12: 2 [IU] via SUBCUTANEOUS
  Administered 2012-11-13: 1 [IU] via SUBCUTANEOUS

## 2012-11-04 MED ORDER — MORPHINE SULFATE 2 MG/ML IJ SOLN
2.0000 mg | INTRAMUSCULAR | Status: DC | PRN
  Administered 2012-11-04: 2 mg via INTRAVENOUS
  Filled 2012-11-04: qty 1

## 2012-11-04 MED ORDER — BUPROPION HCL 100 MG PO TABS
50.0000 mg | ORAL_TABLET | Freq: Every day | ORAL | Status: DC
  Administered 2012-11-04: 100 mg via ORAL
  Administered 2012-11-05 – 2012-11-13 (×9): 50 mg via ORAL
  Filled 2012-11-04 (×11): qty 0.5

## 2012-11-04 MED ORDER — DEXTROSE 5 % IV SOLN
1.0000 g | INTRAVENOUS | Status: DC
  Administered 2012-11-04: 1 g via INTRAVENOUS
  Filled 2012-11-04 (×2): qty 10

## 2012-11-04 MED ORDER — SIMVASTATIN 10 MG PO TABS
10.0000 mg | ORAL_TABLET | Freq: Every day | ORAL | Status: DC
  Administered 2012-11-04 – 2012-11-12 (×9): 10 mg via ORAL
  Filled 2012-11-04 (×10): qty 1

## 2012-11-04 MED ORDER — ONDANSETRON HCL 4 MG PO TABS: 4.0000 mg | ORAL_TABLET | Freq: Four times a day (QID) | ORAL | Status: DC | PRN

## 2012-11-04 MED ORDER — ONDANSETRON HCL 4 MG/2ML IJ SOLN
4.0000 mg | Freq: Four times a day (QID) | INTRAMUSCULAR | Status: DC | PRN
  Administered 2012-11-04 – 2012-11-10 (×3): 4 mg via INTRAVENOUS
  Filled 2012-11-04 (×3): qty 2

## 2012-11-04 NOTE — Progress Notes (Signed)
11/04/2012 patient saturation was 88 to 89% on RA at 1745. She was also c/o having some shortness of breath. She was placed on 2 liters [place], saturation increase 90 to 91. Md is aware and order was given to use oxygen prn. [facility] RN.

## 2012-11-04 NOTE — Progress Notes (Signed)
11/04/2012 patient iv was causing patient discomfort and was not working, iv team was called to place new iv. Two iv team member tried and was unsuccessful. Md was called and is aware. Iv antibiotic was changed to IM, pain medication change to po. [facility] RN.

## 2012-11-04 NOTE — H&P (Signed)
Family Medicine Teaching [facility] Admission History and Physical Service Pager: [phone]  Patient name: Cynthia Cameron Medical record number: [id number] Date of birth: [birth year] Age: 52 y.o. Gender: female  Primary Care Provider: Abbe Amsterdam, MD  Chief Complaint: chest pain and shortness of breath  Assessment and Plan: Cynthia SCHLAUCH is a 52 y.o. year old female recently hospitalized for chest pain presenting with chest pain and shortness of breath and found to have pneumonia as well as hypoglycemia at home.   # Hospital Acquired pneumonia-likely source of chest pain -continue Ceftriaxone and consider transition to PO levaquin when improved. No indication of multidrug resistance to escalate therapy currently.  -currently no o2 requirement, monitor sats -given effusion-will order left lateral decubitus film  # Chest pain-likely due to PNA, will also discuss in AM value of d-dimer given persistent chest pain and recent immobilization though PNA likely cause  -active chest pain: yes  -TIMI score: 2, Wells criteria: 1.5 for immobilization but also not tachpneic   -cards consult: patient recently with negative heart catheterization within 10 days. Not planned currently.    -recent cath showed EF 70%. Hyperdynamic.    -AM EKG  -admit to telemetry bed  -EKG: flattened t waves at baseline but V4-v6 with inverted t waves.   -CE x1 neg but >24 hours since onset of pain  -Risk stratification labs: a1c 6.2, LDL 104 (now on pravachol), tsh wnl last hospitalization  -Meds: ASA started. Consider chronic ASA therapy in 51 DM, on beta blocker nebivolol (consider alternative agent)  -morphine for pain. Not planning NTG given negative cath.   -continuous pulse ox  -IS to help improve SOB  -UDS ordered  -CXR not typical for CHF but will obtain pro-BNP as well given cardiomegaly and SOB  # DM with hypoglycemia this AM to 29 assc with weakness-hold glipizide. Patient also  reportedly on metformin but not seen on med rec. Monitor CBGs without sliding scale for now. Patient does admit to decreased PO due to chest discomfort and power being out.   # Depression-continue Bupropion # Hypertension-BP well controlled. Continue clonidine and nebivolol. Consider alternate agents.  # elevated Cr-AKI (0.3 up from baseline of 1.4 although baseline unclear and really 1.4-1.6) so will monitor for now.   1. FEN/GI: heart healthy diet. 10 hours NS at 100/hr with 20 K 2. Prophylaxis: heparin sq  3. Disposition: tele. Pending clinical improvement. PT ordered due to fatigue 4. Code Status: full  History of Present Illness: Cynthia Cameron is a 52 y.o. year old female presenting with chest pain, SOB and cough.   Chest pain and SOB now present for 2 weeks. Seen and admitted by cardiology on 2/27 due to EKG with nonspecific EKG changes. CXR before d/c showed likely atelectasis at lung bases. Patient was discharged after she had a negative heart catheterization. Since that time, she has developed a cough which has worsened her chest pain and changed its position. Previously "ton of bricks over her right chest" now spread to her left chest as well. Patient denies fever/chills but endorses cough productive of yellow sputum. Cough has exacerbated chest pain.  SOB and chest pain limit her from walking to her mail box. Patient is overall exhauseted as well. Chest pain is also pleuritic and severely painful 10/10. Exertional stress worsens pain but not relieved by rest. No change in pain since catheterization.   Patient has had poor PO as she has lost power and has had low appetite due to her  chset pain. She noted a CBG of 29 at home this morning after she felt that she was overall weak. CBG up to 140 after 3 spoons of syrup after 15 minutes. Within the hour, CBG 85 again.   Patient seen at [facility] [facility] today and was noted to have a LLL pneumonia and given poorly controlled CBGs and  persistent chest pain was sent to ED with plan for admission. Already received ceftriaxone at Urgent Care.    Patient Active Problem List  Diagnosis  . Diabetes mellitus without complication  . Hypertension  . Depression   Past Medical History: Past Medical History  Diagnosis Date  . Allergy   . Depression   . Diabetes mellitus without complication   . Hypertension    Past Surgical History: History reviewed. No pertinent past surgical history. Social History: History  Substance Use Topics  . Smoking status: Never Smoker   . Smokeless tobacco: Not on file  . Alcohol Use: Yes     Comment: social   History   Social History Narrative   LIves in [place]. 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 Lives with husband and 2 sons.     For any additional social history documentation, please refer to relevant sections of EMR.  Family History: Family History  Problem Relation Age of Onset  . Hypertension Father   . Diabetes Mother   . Atrial fibrillation Mother    Allergies: No Known Allergies Current Facility-Administered Medications on File Prior to Encounter  Medication Dose Route Frequency Provider Last Rate Last Dose  . cefTRIAXone (ROCEPHIN) injection 1 g  1 g Intramuscular Q24H Sherren Mocha, MD   1 g at 11/03/12 1806   Current Outpatient Prescriptions on File Prior to Encounter  Medication Sig Dispense Refill  . buPROPion (WELLBUTRIN) 100 MG tablet Take 50 mg by mouth daily.       . cloNIDine (CATAPRES) 0.2 MG tablet Take 0.2 mg by mouth at bedtime.      . cyanocobalamin 1000 MCG tablet Take 2,000 mcg by mouth daily.       Marland Kitchen glipiZIDE (GLUCOTROL) 5 MG tablet Take 2.5 mg by mouth daily. Start with one pill in the morning.  May increase to twice daily if directed.      Marland Kitchen lisinopril (PRINIVIL,ZESTRIL) 20 MG tablet Take 20 mg by mouth daily.      . Multiple Vitamin (MULTIVITAMIN) tablet Take 1 tablet by mouth daily.      . nebivolol (BYSTOLIC) 5 MG tablet Take 5 mg by mouth daily.       . potassium chloride (K-DUR,KLOR-CON) 10 MEQ tablet Take 20 mEq by mouth 2 (two) times daily.       . pravastatin (PRAVACHOL) 40 MG tablet Take 40 mg by mouth daily.       Review Of Systems: Per HPI  Otherwise 12 point review of systems was performed and was unremarkable.  Physical Exam: BP 135/92  Pulse 85  Temp(Src) 98.7 F (37.1 C) (Oral)  Resp 26  SpO2 97%  LMP 09/16/2010 Exam: Gen: appears uncomfortable due to chest pain at rest, obese HEENT: NCAT, MMM, PERRLA  CV: RRR no mrg  Chest wall: cannot reproduce pain Lungs: LLL diminished breath sounds otherwise CTAB Abd: soft/nontender/nondistended/normal bowel sounds  MSK: moves all extremities, trace edmea Skin: warm and dry, no rash  Neuro: grossly normal    Labs and Imaging: CBC BMET   Recent Labs Lab 11/03/12 2305  WBC 12.7*  HGB 11.7*  HCT 34.5*  PLT 410*    Recent Labs Lab 11/03/12 2305  NA 135  K 4.4  CL 98  CO2 25  BUN 26*  CREATININE 1.72*  GLUCOSE 142*  CALCIUM 10.1     Dg Chest 2 View  11/03/2012  *RADIOLOGY REPORT*  Clinical Data: And shortness of breath.  CHEST - 2 VIEW  Comparison: PA and lateral chest 10/17/2012.  Findings: The patient has a small left pleural effusion and dense left basilar airspace disease.  Trace amount of pleural fluid on the right is noted with some streaky opacity, likely atelectasis. No pneumothorax is seen.  There is cardiomegaly.  IMPRESSION:  1.  Left effusion basilar airspace disease worrisome for pneumonia. Recommend follow-up plain films to clearing. 2.  Trace right pleural effusion. 3.  Cardiomegaly.   Original Report Authenticated By: Holley Dexter, M.D.     Tana Conch, MD 11/04/2012, 1:37 AM

## 2012-11-04 NOTE — Progress Notes (Signed)
11/04/12 patient came from the emergency room to 6700 at 1245. she is alert, oriented and ambulatory. Patient c/o weakness and pain in left side. She was placed on telemetry and patient is doing sinus tach. Patient skin is fine, but a little dry. She was place on a bed alarm and get up with assistant because of weakness. [facility] RN.

## 2012-11-04 NOTE — H&P (Addendum)
Seen and examined.  Discussed with Dr. Durene Cal.  Agree with his management.  Briefly, 52 yo female with 2 weeks of chest pain and SOB.  Admitted by cards last week for Rt sided pain and had nl cardiac WU including cath.  Now returns with worsening Lt sided pain and SOB.  Some mild cough.  No known fever. Issues.  Lt pleural effusion, pleuritic pain, etiology?  She was billed as a pneumonia, but I have my doubts.  Still, it could be a parapneumonic effusion.)   My big concern is pulm emboli and we are in the process of working that up currently.  Pleursy (viral, other inflamatory also a consideration.)  Neoplasm with pleuritic irritation is low down on the differential dx.   Needs study to confirm or rule out PE.  Likely needs diagnostic/therapeutic thoracocentesis.  Antibiotics for now.  Will follow.    Given the severity of her pain, may benefit from PCA.  Avoid NSAIDs due to CKD stage 3.  Could also use glucocorticoids for pleuritic pain control.

## 2012-11-05 ENCOUNTER — Inpatient Hospital Stay (HOSPITAL_COMMUNITY): Payer: 59

## 2012-11-05 DIAGNOSIS — J90 Pleural effusion, not elsewhere classified: Secondary | ICD-10-CM | POA: Diagnosis present

## 2012-11-05 DIAGNOSIS — J189 Pneumonia, unspecified organism: Secondary | ICD-10-CM

## 2012-11-05 LAB — CBC WITH DIFFERENTIAL/PLATELET
Basophils Absolute: 0 10*3/uL (ref 0.0–0.1)
Basophils Relative: 0 % (ref 0–1)
HCT: 33 % — ABNORMAL LOW (ref 36.0–46.0)
Hemoglobin: 11.3 g/dL — ABNORMAL LOW (ref 12.0–15.0)
Lymphocytes Relative: 10 % — ABNORMAL LOW (ref 12–46)
MCHC: 34.2 g/dL (ref 30.0–36.0)
Monocytes Absolute: 0.7 10*3/uL (ref 0.1–1.0)
Monocytes Relative: 4 % (ref 3–12)
Neutro Abs: 13.9 10*3/uL — ABNORMAL HIGH (ref 1.7–7.7)
Neutrophils Relative %: 85 % — ABNORMAL HIGH (ref 43–77)
RDW: 14.2 % (ref 11.5–15.5)
WBC: 16.4 10*3/uL — ABNORMAL HIGH (ref 4.0–10.5)

## 2012-11-05 LAB — GLUCOSE, CAPILLARY
Glucose-Capillary: 124 mg/dL — ABNORMAL HIGH (ref 70–99)
Glucose-Capillary: 179 mg/dL — ABNORMAL HIGH (ref 70–99)

## 2012-11-05 LAB — BASIC METABOLIC PANEL
CO2: 26 mEq/L (ref 19–32)
Chloride: 96 mEq/L (ref 96–112)
Creatinine, Ser: 1.45 mg/dL — ABNORMAL HIGH (ref 0.50–1.10)
GFR calc Af Amer: 47 mL/min — ABNORMAL LOW (ref >90)
Potassium: 4.2 mEq/L (ref 3.5–5.1)

## 2012-11-05 LAB — SEDIMENTATION RATE: Sed Rate: 85 mm/h — ABNORMAL HIGH (ref 0–22)

## 2012-11-05 MED ORDER — LEVOFLOXACIN IN D5W 750 MG/150ML IV SOLN
750.0000 mg | INTRAVENOUS | Status: DC
  Administered 2012-11-05 – 2012-11-06 (×2): 750 mg via INTRAVENOUS
  Filled 2012-11-05 (×3): qty 150

## 2012-11-05 MED ORDER — SODIUM CHLORIDE 0.9 % IJ SOLN
10.0000 mL | INTRAMUSCULAR | Status: DC | PRN
  Administered 2012-11-05: 20 mL
  Administered 2012-11-06: 10 mL
  Administered 2012-11-06: 20 mL
  Administered 2012-11-07: 10 mL
  Administered 2012-11-07 – 2012-11-08 (×3): 20 mL
  Administered 2012-11-08: 10 mL
  Administered 2012-11-11: 20 mL
  Administered 2012-11-12 (×3): 10 mL

## 2012-11-05 MED ORDER — POLYETHYLENE GLYCOL 3350 17 G PO PACK
17.0000 g | PACK | Freq: Every day | ORAL | Status: DC
  Administered 2012-11-05 – 2012-11-12 (×7): 17 g via ORAL
  Filled 2012-11-05 (×9): qty 1

## 2012-11-05 MED ORDER — HEPARIN SODIUM (PORCINE) 5000 UNIT/ML IJ SOLN
5000.0000 [IU] | Freq: Three times a day (TID) | INTRAMUSCULAR | Status: DC
  Administered 2012-11-05 – 2012-11-06 (×4): 5000 [IU] via SUBCUTANEOUS
  Filled 2012-11-05 (×6): qty 1

## 2012-11-05 MED ORDER — LIDOCAINE HCL (PF) 1 % IJ SOLN
INTRAMUSCULAR | Status: AC
  Administered 2012-11-05: 5 mL
  Filled 2012-11-05: qty 5

## 2012-11-05 MED ORDER — MORPHINE SULFATE 2 MG/ML IJ SOLN
1.0000 mg | INTRAMUSCULAR | Status: DC | PRN
  Administered 2012-11-05: 1 mg via INTRAVENOUS
  Filled 2012-11-05: qty 1

## 2012-11-05 MED ORDER — SODIUM CHLORIDE 0.9 % IJ SOLN
10.0000 mL | Freq: Two times a day (BID) | INTRAMUSCULAR | Status: DC
  Administered 2012-11-05 – 2012-11-06 (×3): 10 mL
  Administered 2012-11-09: 20 mL
  Administered 2012-11-10 – 2012-11-12 (×2): 10 mL

## 2012-11-05 MED ORDER — MORPHINE SULFATE 2 MG/ML IJ SOLN
INTRAMUSCULAR | Status: AC
  Administered 2012-11-05: 1 mg via INTRAVENOUS
  Filled 2012-11-05: qty 1

## 2012-11-05 MED ORDER — MORPHINE SULFATE 2 MG/ML IJ SOLN
2.0000 mg | INTRAMUSCULAR | Status: DC | PRN
  Administered 2012-11-05 – 2012-11-10 (×23): 2 mg via INTRAVENOUS
  Filled 2012-11-05 (×23): qty 1

## 2012-11-05 MED ORDER — BIOTENE DRY MOUTH MT LIQD
15.0000 mL | Freq: Two times a day (BID) | OROMUCOSAL | Status: DC
  Administered 2012-11-05 – 2012-11-13 (×16): 15 mL via OROMUCOSAL

## 2012-11-05 MED ORDER — SODIUM CHLORIDE 0.9 % IV BOLUS (SEPSIS)
250.0000 mL | Freq: Once | INTRAVENOUS | Status: AC
  Administered 2012-11-05: 250 mL via INTRAVENOUS

## 2012-11-05 NOTE — Progress Notes (Signed)
PGY-1 Daily Progress Note Family Medicine Teaching Service [place] R. Derrin Currey, DO Service Pager: [phone]   Subjective: Pt continues to be short of breath, especially after walking up and down the halls.   Objective:  VITALS Temp:  [98 F (36.7 C)-99.2 F (37.3 C)] 98.2 F (36.8 C) (03/11 0544) Pulse Rate:  [90-105] 99 (03/11 0544) Resp:  [16-20] 20 (03/11 0544) BP: (146-183)/(95-113) 146/108 mmHg (03/11 0544) SpO2:  [87 %-99 %] 98 % (03/11 0544)  In/Out  Intake/Output Summary (Last 24 hours) at 11/05/12 0845 Last data filed at 11/05/12 0545  Gross per 24 hour  Intake      0 ml  Output    250 ml  Net   -250 ml    Physical Exam: Gen: NAD, obese  HEENT: NCAT, MMM, PERRLA  CV: RRR no mrg  Lungs: LLL diminished breath sounds otherwise CTAB  Abd: soft/nontender/nondistended/normal bowel sounds  MSK: moves all extremities, trace edmea  Skin: warm and dry, no rash  Neuro: grossly normal   MEDS Scheduled Meds: . antiseptic oral rinse  15 mL Mouth Rinse BID  . aspirin EC  81 mg Oral Daily  . buPROPion  50 mg Oral Daily  . cefTRIAXone (ROCEPHIN) IM  1 g Intramuscular Q24H  . cloNIDine  0.2 mg Oral QHS  . insulin aspart  0-5 Units Subcutaneous QHS  . insulin aspart  0-9 Units Subcutaneous TID WC  . nebivolol  5 mg Oral Daily  . simvastatin  10 mg Oral q1800  . sodium chloride  3 mL Intravenous Q12H   Continuous Infusions:  PRN Meds:.ondansetron (ZOFRAN) IV, ondansetron, oxyCODONE-acetaminophen  Labs and imaging:   CBC  Recent Labs Lab 11/03/12 1723 11/03/12 2305  WBC 12.9* 12.7*  HGB 11.6* 11.7*  HCT 36.8* 34.5*  PLT  --  410*   BMET/CMET  Recent Labs Lab 11/03/12 2305 11/04/12 0432  NA 135 133*  K 4.4 4.4  CL 98 96  CO2 25 25  BUN 26* 27*  CREATININE 1.72* 1.75*  CALCIUM 10.1 9.8  GLUCOSE 142* 197*   Results for orders placed during the hospital encounter of 11/03/12 (from the past 24 hour(s))  GLUCOSE, CAPILLARY     Status: Abnormal   Collection  Time    11/04/12  8:54 AM      Result Value Range   Glucose-Capillary 110 (*) 70 - 99 mg/dL  GLUCOSE, CAPILLARY     Status: Abnormal   Collection Time    11/04/12 12:37 PM      Result Value Range   Glucose-Capillary 128 (*) 70 - 99 mg/dL   Comment 1 Documented in Chart     Comment 2 Notify RN    GLUCOSE, CAPILLARY     Status: Abnormal   Collection Time    11/04/12  5:22 PM      Result Value Range   Glucose-Capillary 194 (*) 70 - 99 mg/dL   Comment 1 Notify RN    GLUCOSE, CAPILLARY     Status: Abnormal   Collection Time    11/04/12 10:48 PM      Result Value Range   Glucose-Capillary 149 (*) 70 - 99 mg/dL  GLUCOSE, CAPILLARY     Status: Abnormal   Collection Time    11/05/12  7:36 AM      Result Value Range   Glucose-Capillary 124 (*) 70 - 99 mg/dL   Dg Chest 2 View  0/03/6577  *RADIOLOGY REPORT*  Clinical Data: And shortness of breath.  CHEST - 2 VIEW  Comparison: PA and lateral chest 10/17/2012.  Findings: The patient has a small left pleural effusion and dense left basilar airspace disease.  Trace amount of pleural fluid on the right is noted with some streaky opacity, likely atelectasis. No pneumothorax is seen.  There is cardiomegaly.  IMPRESSION:  1.  Left effusion basilar airspace disease worrisome for pneumonia. Recommend follow-up plain films to clearing. 2.  Trace right pleural effusion. 3.  Cardiomegaly.   Original Report Authenticated By: Holley Dexter, M.D.    Ct Chest Wo Contrast  11/04/2012  *RADIOLOGY REPORT*  Clinical Data: Evaluate pleural effusion.  CT CHEST WITHOUT CONTRAST  Technique:  Multidetector CT imaging of the chest was performed following the standard protocol without IV contrast.  Comparison: No priors.  Findings:  Mediastinum: Heart size is borderline enlarged. Moderate volume of pericardial fluid and/or thickening.  No associated pericardial calcification.  Numerous reactive sized mediastinal lymph nodes, largest of which measures up to 9 mm in short  axis in the low right paratracheal station.  No definite pathologic nodal enlargement. Please note that accurate exclusion of hilar adenopathy is limited on noncontrast CT scans.   Esophagus is unremarkable in appearance.  Lungs/Pleura: Moderate left pleural effusion with near complete passive atelectasis of the left lower lobe.  There is also a small right-sided pleural effusion with some atelectasis and/or consolidation in the right lower lobe.  No definite suspicious appearing pulmonary nodules are noted in the aerated portions of the lungs.  Upper Abdomen: Unremarkable.  Musculoskeletal: There are no aggressive appearing lytic or blastic lesions noted in the visualized portions of the skeleton.  IMPRESSION: 1.  Moderate left-sided pleural effusion with near complete passive atelectasis in the left lower lobe.  There is also a small right pleural effusion with some atelectasis and/or consolidation in the right lower lobe. 2.  Moderate amount of pericardial fluid and/or thickening. Clinical correlation for signs and symptoms of acute pericarditis may be warranted.   Original Report Authenticated By: Trudie Reed, M.D.    Dg Chest Left Decubitus  11/04/2012  *RADIOLOGY REPORT*  Clinical Data: Pneumonia, effusion  CHEST - LEFT DECUBITUS  Comparison: 11/03/2012  Findings: Layering left pleural effusion with associated airspace consolidation. Cardiomediastinal contours unchanged.  Right lung predominately clear.  No acute osseous finding.  IMPRESSION: Moderate left pleural effusion with associated airspace opacity; atelectasis versus pneumonia.   Original Report Authenticated By: Jearld Lesch, M.D.         Assessment  Pt is a 52 y.o. patient with significant PMHx for Depression, HTN admitted with chest pain and shortness of breath    Plan:  1. Chest Pain secondary to HCAP vs possible PE vs pericarditis secondary to possible rheumatologic condition - Cath done end of February 2014 without evidence  of CAD  1) Switch to Levaquin from Rocephin as pt had increase in WBC.   2) CT chest w/o contrast shows left sided pleural effusion with possible pericardial fluid.  Consult to PCCM for both thoracentesis of left pleural fluid and central line placement   3) Repeat CBC today, continue pulse ox and O2 as needed (2 L last night)   4) Consider CTA for PE if pt does not improve despite thoracentesis   5) F/U 2 D Echo, ESR, ANA for possible pericarditis  2. Hyperlipidemia - Continue Pravachol  3. DM II - Continue SSI 4. Depression - Continue Wellbutrin  5. HTN - Continue home meds 6. AKI on CKD Stage III -  Creatinine slightly elevated to 1.75 from 1.4  1) Recheck BMP today  FEN/GI:  Heart Healthy Prophylaxis:  SQ Heparin  Disposition: Pending clinical improvement  Code Status: Full   Consandra Laske R. Kawthar Ennen, DO of [facility] 11/05/2012, 9:00 AM

## 2012-11-05 NOTE — Progress Notes (Signed)
FMTS Attending Note Patient seen and examined by me today, discussed with resident team and I Agree with Dr Paulina Fusi' note for today.  Patient reports continued L sided chest pain that is continuous.  She describes improvement with sitting up, worse with laying supine.   Given findings of percardial effusion and bilat pleural effusions, family hx of SLE (first cousin on mother's side), to consider SLE among differential. ECHO today to characterize pericardial effusion and cardiac function.  Appreciate CCM's consult for central line placement as well as thoracocentesis.   [name], MD

## 2012-11-05 NOTE — Progress Notes (Signed)
ANTIBIOTIC CONSULT NOTE - INITIAL  Pharmacy Consult for Levaquin Indication: Empiric HCAP coverage  No Known Allergies  Patient Measurements: Height: 5\' 7"  (170.2 cm) IBW/kg (Calculated) : 61.6  Vital Signs: Temp: 98.8 F (37.1 C) (03/11 1000) Temp src: Oral (03/11 1000) BP: 131/86 mmHg (03/11 1000) Pulse Rate: 94 (03/11 1000) Intake/Output from previous day: 03/10 0701 - 03/11 0700 In: -  Out: 250 [Urine:250] Intake/Output from this shift: Total I/O In: 363 [P.O.:360; I.V.:3] Out: -   Labs:  Recent Labs  11/03/12 1723 11/03/12 2305 11/04/12 0432 11/05/12 0951  WBC 12.9* 12.7*  --  16.4*  HGB 11.6* 11.7*  --  11.3*  PLT  --  410*  --  337  CREATININE  --  1.72* 1.75* 1.45*   The CrCl is unknown because both a height and weight (above a minimum accepted value) are required for this calculation. No results found for this basename: VANCOTROUGH, VANCOPEAK, VANCORANDOM, GENTTROUGH, GENTPEAK, GENTRANDOM, TOBRATROUGH, TOBRAPEAK, TOBRARND, AMIKACINPEAK, AMIKACINTROU, AMIKACIN,  in the last 72 hours   Microbiology: No results found for this or any previous visit (from the past 720 hour(s)).  Medical History: Past Medical History  Diagnosis Date  . Allergy   . Depression   . Diabetes mellitus without complication   . Hypertension   . Pneumonia 10/2012    Assessment: 52 y.o. F to start Levaquin for empiric HCAP coverage (recent admission in Feb '14). CXR on 3/10 showed moderate L pleural effusion and airspace opacity >> atelectasis vs PNA. Tmax/24h: 99.2, WBC 16.4 << 12.7. SCr 1.45, CrCl~50 ml/min.   Goal of Therapy:  Proper antibiotics for infection/cultures adjusted for renal/hepatic function   Plan:  1. Levaquin 750 mg IV every 24 hours 2. Will continue to follow renal function, culture results, LOT, and antibiotic de-escalation plans   Georgina Pillion, PharmD, BCPS Clinical Pharmacist Pager: [phone] 11/05/2012 1:13 PM

## 2012-11-05 NOTE — Procedures (Signed)
Central Venous Catheter Insertion Procedure Note Cynthia Cameron [id number] 1960-10-04  Procedure: Insertion of Central Venous Catheter Indications: Assessment of intravascular volume and IVF   Procedure Details Consent: Risks of procedure as well as the alternatives and risks of each were explained to the (patient/caregiver).  Consent for procedure obtained. and Unable to obtain consent because of altered level of consciousness. Time Out: Verified patient identification, verified procedure, site/side was marked, verified correct patient position, special equipment/implants available, medications/allergies/relevent history reviewed, required imaging and test results available.  Performed Real time [place] used to ID and cannulate the vessel  Maximum sterile technique was used including antiseptics, cap, gloves, gown, hand hygiene, mask and sheet. Skin prep: Chlorhexidine; local anesthetic administered A antimicrobial bonded/coated triple lumen catheter was placed in the left internal jugular vein using the Seldinger technique.  Evaluation Blood flow good Complications: No apparent complications Patient did tolerate procedure well. Chest X-ray ordered to verify placement.  CXR: pending.  BABCOCK,PETE 11/05/2012, 2:16 PM  U/S used in placement.  Patient seen and examined, agree with above note.  I dictated the care and orders written for this patient under my direction.  Alyson Reedy, MD [phone]

## 2012-11-05 NOTE — Evaluation (Signed)
Physical Therapy Evaluation Patient Details Name: Cynthia Cameron MRN: [id number] DOB: 1961-02-19 Today's Date: 11/05/2012 Time: 4098-1191 PT Time Calculation (min): 26 min  PT Assessment / Plan / Recommendation Clinical Impression  69 you female admitted with hypoglycemia and chest pain.  She continues to be limited by pain, splinting posture with shallow breaths with activity.  She will benefit from continued PT and ambulation with nursing while in hospital and hopefully will be able to be discharged to home without HHPT or DME.    PT Assessment  Patient needs continued PT services    Follow Up Recommendations  No PT follow up    Does the patient have the potential to tolerate intense rehabilitation      Barriers to Discharge        Equipment Recommendations  None recommended by PT    Recommendations for Other Services     Frequency Min 3X/week    Precautions / Restrictions Restrictions Weight Bearing Restrictions: No   Pertinent Vitals/Pain Pain and chest splinting posture with any movement      Mobility  Bed Mobility Bed Mobility: Rolling Right;Supine to Sit Rolling Right: With rail;4: Min assist Supine to Sit: 3: Mod assist;HOB elevated;With rails Details for Bed Mobility Assistance: pt with pain with activity Transfers Transfers: Sit to Stand;Stand to Sit Sit to Stand: 4: Min assist Stand to Sit: 4: Min assist Details for Transfer Assistance: slow guarded movement, with decreased O2 sats to 83% with activity Ambulation/Gait Ambulation/Gait Assistance: 4: Min assist Ambulation Distance (Feet): 20 Feet (repeated stepping forward and back, induces pain) Assistive device: 1 person hand held assist Ambulation/Gait Assistance Details: pt reports pain with upright stepping activities Gait Pattern: Step-to pattern Gait velocity: decreased General Gait Details: painful, decreased O2 sats Stairs: No Wheelchair Mobility Wheelchair Mobility: No    Exercises  General Exercises - Lower Extremity Ankle Circles/Pumps: AROM;Both;10 reps;Supine Quad Sets: AROM;Both;10 reps;Supine Gluteal Sets: AROM;Supine;Standing;Both;10 reps Hip ABduction/ADduction: AAROM;Both;5 reps;Supine Other Exercises Other Exercises: pt educated to do small bouts of activity throughout the day.  Other Exercises: pt unable to isolate abdominals or do deep breathing activities due to splinting and pain   PT Diagnosis: Difficulty walking;Acute pain  PT Problem List: Decreased activity tolerance;Pain;Cardiopulmonary status limiting activity PT Treatment Interventions: DME instruction;Gait training;Therapeutic exercise   PT Goals Acute Rehab PT Goals PT Goal Formulation: With patient Time For Goal Achievement: 11/19/12 Potential to Achieve Goals: Good Pt will go Supine/Side to Sit: Independently PT Goal: Supine/Side to Sit - Progress: Goal set today Pt will go Sit to Supine/Side: Independently PT Goal: Sit to Supine/Side - Progress: Goal set today Pt will go Sit to Stand: Independently PT Goal: Sit to Stand - Progress: Goal set today Pt will go Stand to Sit: Independently PT Goal: Stand to Sit - Progress: Goal set today Pt will Ambulate: >150 feet;Independently PT Goal: Ambulate - Progress: Goal set today Pt will Go Up / Down Stairs: 1-2 stairs;with modified independence PT Goal: Up/Down Stairs - Progress: Goal set today  Visit Information  Last PT Received On: 11/05/12 Assistance Needed: +1    Subjective Data  Subjective: It hurts whenever I move Patient Stated Goal: to get better   Prior Functioning  Home Living Lives With: Family Type of Home: House Home Access: Stairs to enter Secretary/administrator of Steps: 2 Home Layout: Able to live on main level with bedroom/bathroom Prior Function Level of Independence: Independent Communication Communication: No difficulties    Cognition  Cognition Overall Cognitive Status: Appears  within functional limits for  tasks assessed/performed Arousal/Alertness: Awake/alert Orientation Level: Appears intact for tasks assessed Behavior During Session: [facility] for tasks performed    Extremity/Trunk Assessment Right Lower Extremity Assessment RLE ROM/Strength/Tone: [facility] for tasks assessed Left Lower Extremity Assessment LLE ROM/Strength/Tone: [facility] for tasks assessed (mild edema in both lower legs) Trunk Assessment Trunk Assessment: Other exceptions Trunk Exceptions: pt unable to isolate abdominal contraction due to pain with any activity or attempt at core muscle activation.   Balance Balance Balance Assessed: Yes Static Sitting Balance Static Sitting - Balance Support: Feet supported;No upper extremity supported Static Sitting - Level of Assistance: 5: Stand by assistance Static Sitting - Comment/# of Minutes: pt appears uncomfortable appears to be holding splinting posture , unable to flex and extend trunk due to pain  Static Standing Balance Static Standing - Balance Support: Right upper extremity supported Static Standing - Level of Assistance: 4: Min assist Static Standing - Comment/# of Minutes: painful  End of Session PT - End of Session Equipment Utilized During Treatment: Oxygen Activity Tolerance: Patient limited by pain Patient left: in chair;with call bell/phone within reach  GP    Rosey Bath [place], [place] [phone] 11/05/2012, 11:02 AM

## 2012-11-05 NOTE — Progress Notes (Signed)
  Echocardiogram 2D Echocardiogram has been performed.  Ellender Hose A 11/05/2012, 3:18 PM

## 2012-11-05 NOTE — Procedures (Signed)
Thoracentesis Procedure Note  Pre-operative Diagnosis: pleural   Post-operative Diagnosis: normal  Indications: thoracentesis   Procedure Details  Consent: Informed consent was obtained. Risks of the procedure were discussed including: infection, bleeding, pain, pneumothorax.  Under sterile conditions the patient was positioned. Betadine solution and sterile drapes were utilized.  1% buffered lidocaine was used to anesthetize the pleural  space. Was unable to collect specimen d/t small fluid pocket size.  Plan A follow up chest x-ray was ordered. Bed Rest for 0 hours. Tylenol 650 mg. for pain.  Attending Attestation: I was present and scrubbed for the entire procedure.  U/S used localize, we were only able to remove a small amount of fluid that is not enough for testing.  If fluid is needed recommend called IR to perform procedure.  Patient seen and examined, agree with above note.  I dictated the care and orders written for this patient under my direction.  Alyson Reedy, MD [phone]

## 2012-11-06 DIAGNOSIS — I2699 Other pulmonary embolism without acute cor pulmonale: Secondary | ICD-10-CM

## 2012-11-06 DIAGNOSIS — R0600 Dyspnea, unspecified: Secondary | ICD-10-CM | POA: Diagnosis present

## 2012-11-06 DIAGNOSIS — I319 Disease of pericardium, unspecified: Secondary | ICD-10-CM

## 2012-11-06 DIAGNOSIS — R091 Pleurisy: Secondary | ICD-10-CM | POA: Diagnosis present

## 2012-11-06 DIAGNOSIS — I3139 Other pericardial effusion (noninflammatory): Secondary | ICD-10-CM | POA: Diagnosis present

## 2012-11-06 DIAGNOSIS — R0609 Other forms of dyspnea: Secondary | ICD-10-CM

## 2012-11-06 DIAGNOSIS — I313 Pericardial effusion (noninflammatory): Secondary | ICD-10-CM | POA: Diagnosis present

## 2012-11-06 LAB — CBC WITH DIFFERENTIAL/PLATELET
Basophils Relative: 0 % (ref 0–1)
Eosinophils Absolute: 0.3 10*3/uL (ref 0.0–0.7)
Hemoglobin: 10.1 g/dL — ABNORMAL LOW (ref 12.0–15.0)
Lymphs Abs: 1.4 10*3/uL (ref 0.7–4.0)
MCH: 26.9 pg (ref 26.0–34.0)
MCHC: 34.2 g/dL (ref 30.0–36.0)
Monocytes Relative: 6 % (ref 3–12)
Neutro Abs: 11.1 10*3/uL — ABNORMAL HIGH (ref 1.7–7.7)
Neutrophils Relative %: 82 % — ABNORMAL HIGH (ref 43–77)
Platelets: 335 10*3/uL (ref 150–400)
RBC: 3.76 MIL/uL — ABNORMAL LOW (ref 3.87–5.11)

## 2012-11-06 LAB — BASIC METABOLIC PANEL
Chloride: 99 mEq/L (ref 96–112)
GFR calc Af Amer: 56 mL/min — ABNORMAL LOW (ref >90)
GFR calc non Af Amer: 49 mL/min — ABNORMAL LOW (ref >90)
Potassium: 4 mEq/L (ref 3.5–5.1)
Sodium: 134 mEq/L — ABNORMAL LOW (ref 135–145)

## 2012-11-06 LAB — PROCALCITONIN: Procalcitonin: 0.16 ng/mL

## 2012-11-06 LAB — RHEUMATOID FACTOR: Rhuematoid fact SerPl-aCnc: 22 IU/mL — ABNORMAL HIGH (ref <=14)

## 2012-11-06 LAB — ANA: Anti Nuclear Antibody(ANA): NEGATIVE

## 2012-11-06 LAB — BLOOD GAS, ARTERIAL
Acid-base deficit: 1.2 mmol/L (ref 0.0–2.0)
Patient temperature: 98.3
TCO2: 24.5 mmol/L (ref 0–100)
pCO2 arterial: 40 mmHg (ref 35.0–45.0)

## 2012-11-06 LAB — GLUCOSE, CAPILLARY
Glucose-Capillary: 148 mg/dL — ABNORMAL HIGH (ref 70–99)
Glucose-Capillary: 182 mg/dL — ABNORMAL HIGH (ref 70–99)

## 2012-11-06 MED ORDER — RIVAROXABAN 15 MG PO TABS
15.0000 mg | ORAL_TABLET | Freq: Two times a day (BID) | ORAL | Status: DC
  Administered 2012-11-06 – 2012-11-07 (×3): 15 mg via ORAL
  Filled 2012-11-06 (×7): qty 1

## 2012-11-06 MED ORDER — HYDROMORPHONE HCL PF 1 MG/ML IJ SOLN
0.5000 mg | Freq: Once | INTRAMUSCULAR | Status: AC
  Administered 2012-11-06: 0.5 mg via INTRAVENOUS
  Filled 2012-11-06: qty 1

## 2012-11-06 MED ORDER — SODIUM CHLORIDE 0.9 % IV BOLUS (SEPSIS)
1000.0000 mL | Freq: Once | INTRAVENOUS | Status: AC
  Administered 2012-11-06: 1000 mL via INTRAVENOUS

## 2012-11-06 MED ORDER — ENOXAPARIN SODIUM 100 MG/ML ~~LOC~~ SOLN
1.0000 mg/kg | Freq: Two times a day (BID) | SUBCUTANEOUS | Status: DC
  Filled 2012-11-06 (×2): qty 1

## 2012-11-06 NOTE — Discharge Summary (Signed)
Physician Discharge Summary  Patient ID: Cynthia Cameron MRN: [id number] DOB: 1961/08/27 Age: 52 y.o.  Admit date: 11/03/2012 Discharge date: 11/13/2012 Admitting Physician: Sanjuana Letters, MD  PCP: Abbe Amsterdam, MD  Consultants:Pulmonology-CCM, Cardiology (Dr. Algie Coffer)     Discharge Diagnosis: Principal Problem:   Pulmonary embolism Active Problems:   Diabetes mellitus without complication   Hypertension   Depression   HAP (hospital-acquired pneumonia)   Pleural effusion   Pleuritis   Pericardial effusion   Dyspnea   DVT (deep venous thrombosis)    Hospital Course Pt is a 52 y.o. patient with significant PMHx for Depression, HTN admitted with chest pain and shortness of breath found to have DVT, R sided PE, and HCAP left lung with B/L pleural effusions   1. Dyspnea secondary to HCAP, R sided PE and B/L Pleural Effusions - Pt presented to the ED with worsening SOB and multiple studies were performed.  A CXR showed LLL infiltrate concerning for PNA.  Since pt had a cath done end of February 2014 without evidence of CAD, she was treated initially for Hospital acquired pneumonia with Levaquin. A CTA was considered at this time but pt had AKI and no PIV access.  A CBC on admission showed a leukocytosis to 12.7.  Since pt had some mid sternal chest pain without worsening with activity or relieved by rest, troponins were cycled and negative x 3, EKG was performed not showing acute changes, a BNP was ordered which was 319.  Pt was admitted to telemetry with continuous pulse ox and she continued to have tachycardia and tachypnea with hypoxia despite ABx treatment.  A CT w/out contrast was ordered which showed B/L pleural effusions with minimal pericardial fluid.  Pulmonology - CCM was consulted for thoracentesis and central line placement.  They were unable to get pleural fluid from the left side (L Pleural effusion > R) and recommended getting an Echo to evaluate for pericardial  fluid along with possible CHF.  An Echo was performed showing an EF of around 60-65% with minimal pericardial effusion.  At this point, Dr. Algie Coffer was consulted to evaluate for possible re-infarct vs post cath complications.  Dr. Algie Coffer did not believe this pain was secondary to cardiac origin/post cath complications and recommended continued treatment.  Since pt did not have PIV and continued to have Sx of a PE, IR was asked to inject dye through the central line for a CTA.  Howeve, this could not be done so LE dopplers were performed showing LLE DVT.  Pt was subsequently started on Xarelto and followed.  However, pt continued not to improve, was afebrile, but did have tachycardia and tachypnea w/ decreased saturations.  Her ABx coverage was broadened to include Cefepime and Vanc, a repeat BNP was obtained showing an elevation to 1000+, a repeat Echo was unchanged, and a CTA was performed after her AKI resolved and PIV access was obtained.  The CTA showed right upper/middle lobe PE's and after discussion with pulmonology, she was transitioned from Xarelto to Heparin IV in case further procedures were needed. Pt was continued on IV ABx for three days and switched back to Xarelto PO.   She continued to improve and was switched to PO Levaquin and did well without fever, chills, SOB, tachycardia, or tachypnea.    2. DVT - See above  3. Grade 1 Diastolic CHF - Pt had Echo done showing grade one diastolic dysfunction.  Her BNP did elevate during her hospital stay and was given two doses  of PO Lasix.  She did not have S/Sx of fluid overload on exam/history during her stay.    4. Hyperlipidemia - Continue Pravachol   5. Hypoglycemia with history of DM II - Pt home medications were held on admission and placed on SSI.  She had decreased oral intake the day prior to admission and checked her sugars the day of admission, noted to have a CBG of 29.  On D/C her glipizide was d/c due to her hypoglycemic episode and her  last A1C was 6.2.    6. Depression - Stable, continued Wellbutrin   7. HTN - Stable, continued her lisinopril/bystolic but held her clonidine  8. AKI on CKD Stage III - Pt admitted to the hospital with creatinine of 1.7 (baseline around 1-1.2).  She was hydrated for possible dehydration causing pre-renal azotemia and her creatinine slowly decreased to around 1.    9. Constipation - Miralax daily and colace as needed secondary to Narcotic use during her hospital stay.          Discharge PE   Filed Vitals:   11/06/12 0507  BP: 151/98  Pulse: 92  Temp: 98.5 F (36.9 C)  Resp: 21   Gen: NAD CV: RRR, no murmur appreciated  Lungs: CTA B/L, no tachypnea  Abd: soft/nontender/nondistended/normal bowel sounds  MSK: moves all extremities, trace edmea  Skin: warm and dry, no rash  Neuro: grossly normal   Procedures/Imaging:  Dg Chest 2 View  11/03/2012   IMPRESSION:  1.  Left effusion basilar airspace disease worrisome for pneumonia. Recommend follow-up plain films to clearing. 2.  Trace right pleural effusion. 3.  Cardiomegaly.   Original Report Authenticated By: Holley Dexter, M.D.    Ct Chest Wo Contrast  11/04/2012   IMPRESSION: 1.  Moderate left-sided pleural effusion with near complete passive atelectasis in the left lower lobe.  There is also a small right pleural effusion with some atelectasis and/or consolidation in the right lower lobe. 2.  Moderate amount of pericardial fluid and/or thickening. Clinical correlation for signs and symptoms of acute pericarditis may be warranted.   Original Report Authenticated By: Trudie Reed, M.D.    Dg Chest Port 1 View  11/05/2012 IMPRESSION:  1.  New left internal jugular center venous catheter in appropriate position.  No pneumothorax identified. 2.  No significant change and bilateral pleural effusions and bilateral lower lobe infiltrates versus atelectasis, left side greater than right.   Original Report Authenticated By: Myles Rosenthal,  M.D.    CT W Contrast Chest 11/05/12 -  MPRESSION:  Filling defects within right upper and middle lobe pulmonary  arterial branches consistent with pulmonary embolism.  Bilateral pleural effusions and lower lobe atelectasis greater on  the left.  Moderate pericardial effusion.   Echo 11/08/12 - Left ventricle: The cavity size was normal. There was mild concentric hypertrophy. Systolic function was normal. The estimated ejection fraction was in the range of 60% to 65%. Wall motion was normal; there were no regional wall motion abnormalities. - Pulmonary arteries: Systolic pressure was mildly increased. PA peak pressure: 35mm Hg (S). - Pericardium, extracardiac: A trivial pericardial effusion was identified. There was a large left pleural effusion with large echodense shadow consistent with organizing thrombus or atelectatic lung shadow.  LE Doppler B/L - LLE + for DVT   Labs  CBC  Recent Labs Lab 11/03/12 2305 11/05/12 0951 11/06/12 0500  WBC 12.7* 16.4* 13.6*  HGB 11.7* 11.3* 10.1*  HCT 34.5* 33.0* 29.5*  PLT 410*  337 335   BMET  Recent Labs Lab 11/04/12 0432 11/05/12 0951 11/06/12 0500  NA 133* 133* 134*  K 4.4 4.2 4.0  CL 96 96 99  CO2 25 26 25   BUN 27* 19 17  CREATININE 1.75* 1.45* 1.25*  CALCIUM 9.8 9.5 9.3  GLUCOSE 197* 205* 166*      Patient condition at time of discharge/disposition: stable  Disposition-home   Follow up issues: 1. PE - Continue on Xarelto for 21 days at 15 mg BID and then 20 mg qd.  Received two days prior to d/c.  2. DM II - Stopped Glipizide due to hypoglycemic episode prior to admission.  Consider starting low dose Metformin if no contraindication  3. PNA - Finish course of Levaquin, consider repeating CBC if any Sx  Discharge follow up:    Follow-up Information   Follow up with COPLAND,JESSICA, MD. Schedule an appointment as soon as possible for a visit in 1 week.   Contact information:   [address] [place]  [address] [phone]         Discharge Orders   Future Orders Complete By Expires     Call MD for:  difficulty breathing, headache or visual disturbances  As directed     Call MD for:  redness, tenderness, or signs of infection (pain, swelling, redness, odor or green/yellow discharge around incision site)  As directed     Call MD for:  severe uncontrolled pain  As directed     Increase activity slowly  As directed     Comments:      Wait at least two weeks and discuss with Dr. Dallas Schimke about exercise.         Discharge Instructions: Please refer to Patient Instructions section of EMR for full details.  Patient was counseled important signs and symptoms that should prompt return to medical care, changes in medications, dietary instructions, activity restrictions, and follow up appointments.  Significant instructions noted below:    Discharge Medications   Medication List    STOP taking these medications       glipiZIDE 5 MG tablet  Commonly known as:  GLUCOTROL      TAKE these medications       buPROPion 100 MG tablet  Commonly known as:  WELLBUTRIN  Take 50 mg by mouth daily.     chlorpheniramine-HYDROcodone 10-8 MG/5ML Lqcr  Commonly known as:  TUSSIONEX  Take 5 mLs by mouth every 12 (twelve) hours as needed.     cloNIDine 0.2 MG tablet  Commonly known as:  CATAPRES  Take 0.2 mg by mouth at bedtime.     cyanocobalamin 1000 MCG tablet  Take 2,000 mcg by mouth daily.     DSS 100 MG Caps  Take 100 mg by mouth daily as needed.     KAZANO 12.12-998 MG Tabs  Generic drug:  Alogliptin-Metformin HCl  Take 1 tablet by mouth 2 (two) times daily.     levofloxacin 750 MG tablet  Commonly known as:  LEVAQUIN  Take 1 tablet (750 mg total) by mouth daily.     lisinopril 20 MG tablet  Commonly known as:  PRINIVIL,ZESTRIL  Take 20 mg by mouth daily.     multivitamin tablet  Take 1 tablet by mouth daily.     nebivolol 5 MG tablet  Commonly known as:  BYSTOLIC  Take  5 mg by mouth daily.     potassium chloride 10 MEQ tablet  Commonly known as:  K-DUR,KLOR-CON  Take 20 mEq by mouth 2 (two) times daily.     pravastatin 40 MG tablet  Commonly known as:  PRAVACHOL  Take 40 mg by mouth daily.     Rivaroxaban 15 MG Tabs tablet  Commonly known as:  XARELTO  Take 1 tablet (15 mg total) by mouth daily.     Rivaroxaban 15 MG Tabs tablet  Commonly known as:  XARELTO  Take 1 tablet (15 mg total) by mouth daily.  Start taking on:  11/23/2012     Rivaroxaban 20 MG Tabs  Commonly known as:  XARELTO  Take 1 tablet (20 mg total) by mouth daily.  Start taking on:  12/01/2012            Gildardo Cranker, DO of [facility] 11/13/2012 11:29 AM

## 2012-11-06 NOTE — Progress Notes (Signed)
*  Preliminary Results* Bilateral lower extremity venous duplex completed. The left lower extremity is positive for deep vein thrombosis involving the left posterior tibial and peroneal veins. No evidence of right lower extremity deep vein thrombosis or bilateral Baker's cyst. Preliminary results discussed with Synetta Fail, RN.  11/06/2012 4:42 PM Gertie Fey, RDMS, RDCS

## 2012-11-06 NOTE — Progress Notes (Signed)
Pt c/o pain on left side under her breast,she  has morphine 2mg  every 2 hours ordered and percocet two tablets po..pt gets no relief.Notified MD on call see orders,PT has relief from dilaudid IV,will cont to monitor.

## 2012-11-06 NOTE — Consult Note (Signed)
Cynthia Cameron is an 52 y.o. female.   Chief Complaint: Left sided chest pain HPI: 52 yo female with 2 weeks of left sided chest pain and SOB. She had normal cardiac WU including cath on 10/25/2012. Now returns with worsening Lt sided pain and SOB. Some mild cough. No known fever. CT and ultrasound shows moderate left pleural effusion. Elevated ESR. ANA pending. Echocardiogram shows very small posterior pericardial effusion with mld LVH and normal LV systolic function.   Past Medical History  Diagnosis Date  . Allergy   . Depression   . Diabetes mellitus without complication   . Hypertension   . Pneumonia 10/2012      Past Surgical History  Procedure Laterality Date  . Cesarean section    . Cardiac catheterization  2014    Family History  Problem Relation Age of Onset  . Hypertension Father   . Diabetes Mother   . Atrial fibrillation Mother    Social History:  reports that she has never smoked. She has never used smokeless tobacco. She reports that  drinks alcohol. She reports that she does not use illicit drugs.  Allergies: No Known Allergies  Medications Prior to Admission  Medication Dose Route Frequency Provider Last Rate Last Dose  . cefTRIAXone (ROCEPHIN) injection 1 g  1 g Intramuscular Q24H Sherren Mocha, MD   1 g at 11/03/12 1806   Medications Prior to Admission  Medication Sig Dispense Refill  . Alogliptin-Metformin HCl (KAZANO) 12.12-998 MG TABS Take 1 tablet by mouth 2 (two) times daily.      Marland Kitchen buPROPion (WELLBUTRIN) 100 MG tablet Take 50 mg by mouth daily.       . cloNIDine (CATAPRES) 0.2 MG tablet Take 0.2 mg by mouth at bedtime.      . cyanocobalamin 1000 MCG tablet Take 2,000 mcg by mouth daily.       Marland Kitchen glipiZIDE (GLUCOTROL) 5 MG tablet Take 2.5 mg by mouth daily. Start with one pill in the morning.  May increase to twice daily if directed.      Marland Kitchen lisinopril (PRINIVIL,ZESTRIL) 20 MG tablet Take 20 mg by mouth daily.      . Multiple Vitamin (MULTIVITAMIN)  tablet Take 1 tablet by mouth daily.      . nebivolol (BYSTOLIC) 5 MG tablet Take 5 mg by mouth daily.      . potassium chloride (K-DUR,KLOR-CON) 10 MEQ tablet Take 20 mEq by mouth 2 (two) times daily.       . pravastatin (PRAVACHOL) 40 MG tablet Take 40 mg by mouth daily.        Results for orders placed during the hospital encounter of 11/03/12 (from the past 48 hour(s))  GLUCOSE, CAPILLARY     Status: Abnormal   Collection Time    11/04/12 12:37 PM      Result Value Range   Glucose-Capillary 128 (*) 70 - 99 mg/dL   Comment 1 Documented in Chart     Comment 2 Notify RN    GLUCOSE, CAPILLARY     Status: Abnormal   Collection Time    11/04/12  5:22 PM      Result Value Range   Glucose-Capillary 194 (*) 70 - 99 mg/dL   Comment 1 Notify RN    GLUCOSE, CAPILLARY     Status: Abnormal   Collection Time    11/04/12 10:48 PM      Result Value Range   Glucose-Capillary 149 (*) 70 - 99 mg/dL  GLUCOSE, CAPILLARY  Status: Abnormal   Collection Time    11/05/12  7:36 AM      Result Value Range   Glucose-Capillary 124 (*) 70 - 99 mg/dL  CBC WITH DIFFERENTIAL     Status: Abnormal   Collection Time    11/05/12  9:51 AM      Result Value Range   WBC 16.4 (*) 4.0 - 10.5 K/uL   RBC 4.09  3.87 - 5.11 MIL/uL   Hemoglobin 11.3 (*) 12.0 - 15.0 g/dL   HCT 16.1 (*) 09.6 - 04.5 %   MCV 80.7  78.0 - 100.0 fL   MCH 27.6  26.0 - 34.0 pg   MCHC 34.2  30.0 - 36.0 g/dL   RDW 40.9  81.1 - 91.4 %   Platelets 337  150 - 400 K/uL   Neutrophils Relative 85 (*) 43 - 77 %   Neutro Abs 13.9 (*) 1.7 - 7.7 K/uL   Lymphocytes Relative 10 (*) 12 - 46 %   Lymphs Abs 1.6  0.7 - 4.0 K/uL   Monocytes Relative 4  3 - 12 %   Monocytes Absolute 0.7  0.1 - 1.0 K/uL   Eosinophils Relative 1  0 - 5 %   Eosinophils Absolute 0.2  0.0 - 0.7 K/uL   Basophils Relative 0  0 - 1 %   Basophils Absolute 0.0  0.0 - 0.1 K/uL  BASIC METABOLIC PANEL     Status: Abnormal   Collection Time    11/05/12  9:51 AM      Result  Value Range   Sodium 133 (*) 135 - 145 mEq/L   Potassium 4.2  3.5 - 5.1 mEq/L   Chloride 96  96 - 112 mEq/L   CO2 26  19 - 32 mEq/L   Glucose, Bld 205 (*) 70 - 99 mg/dL   BUN 19  6 - 23 mg/dL   Creatinine, Ser 7.82 (*) 0.50 - 1.10 mg/dL   Calcium 9.5  8.4 - 95.6 mg/dL   GFR calc non Af Amer 41 (*) >90 mL/min   GFR calc Af Amer 47 (*) >90 mL/min   Comment:            The eGFR has been calculated     using the CKD EPI equation.     This calculation has not been     validated in all clinical     situations.     eGFR's persistently     <90 mL/min signify     possible Chronic Kidney Disease.  GLUCOSE, CAPILLARY     Status: Abnormal   Collection Time    11/05/12 12:02 PM      Result Value Range   Glucose-Capillary 190 (*) 70 - 99 mg/dL  SEDIMENTATION RATE     Status: Abnormal   Collection Time    11/05/12  2:15 PM      Result Value Range   Sed Rate 85 (*) 0 - 22 mm/hr  GLUCOSE, CAPILLARY     Status: Abnormal   Collection Time    11/05/12  4:39 PM      Result Value Range   Glucose-Capillary 179 (*) 70 - 99 mg/dL   Comment 1 Notify RN     Comment 2 Documented in Chart    GLUCOSE, CAPILLARY     Status: Abnormal   Collection Time    11/05/12 10:34 PM      Result Value Range   Glucose-Capillary 110 (*) 70 - 99 mg/dL  CBC WITH DIFFERENTIAL     Status: Abnormal   Collection Time    11/06/12  5:00 AM      Result Value Range   WBC 13.6 (*) 4.0 - 10.5 K/uL   RBC 3.76 (*) 3.87 - 5.11 MIL/uL   Hemoglobin 10.1 (*) 12.0 - 15.0 g/dL   HCT 40.9 (*) 81.1 - 91.4 %   MCV 78.5  78.0 - 100.0 fL   MCH 26.9  26.0 - 34.0 pg   MCHC 34.2  30.0 - 36.0 g/dL   RDW 78.2  95.6 - 21.3 %   Platelets 335  150 - 400 K/uL   Neutrophils Relative 82 (*) 43 - 77 %   Neutro Abs 11.1 (*) 1.7 - 7.7 K/uL   Lymphocytes Relative 10 (*) 12 - 46 %   Lymphs Abs 1.4  0.7 - 4.0 K/uL   Monocytes Relative 6  3 - 12 %   Monocytes Absolute 0.8  0.1 - 1.0 K/uL   Eosinophils Relative 2  0 - 5 %   Eosinophils  Absolute 0.3  0.0 - 0.7 K/uL   Basophils Relative 0  0 - 1 %   Basophils Absolute 0.0  0.0 - 0.1 K/uL  BASIC METABOLIC PANEL     Status: Abnormal   Collection Time    11/06/12  5:00 AM      Result Value Range   Sodium 134 (*) 135 - 145 mEq/L   Potassium 4.0  3.5 - 5.1 mEq/L   Chloride 99  96 - 112 mEq/L   CO2 25  19 - 32 mEq/L   Glucose, Bld 166 (*) 70 - 99 mg/dL   BUN 17  6 - 23 mg/dL   Creatinine, Ser 0.86 (*) 0.50 - 1.10 mg/dL   Calcium 9.3  8.4 - 57.8 mg/dL   GFR calc non Af Amer 49 (*) >90 mL/min   GFR calc Af Amer 56 (*) >90 mL/min   Comment:            The eGFR has been calculated     using the CKD EPI equation.     This calculation has not been     validated in all clinical     situations.     eGFR's persistently     <90 mL/min signify     possible Chronic Kidney Disease.  GLUCOSE, CAPILLARY     Status: Abnormal   Collection Time    11/06/12  7:45 AM      Result Value Range   Glucose-Capillary 148 (*) 70 - 99 mg/dL   Ct Chest Wo Contrast  11/04/2012  *RADIOLOGY REPORT*  Clinical Data: Evaluate pleural effusion.  CT CHEST WITHOUT CONTRAST  Technique:  Multidetector CT imaging of the chest was performed following the standard protocol without IV contrast.  Comparison: No priors.  Findings:  Mediastinum: Heart size is borderline enlarged. Moderate volume of pericardial fluid and/or thickening.  No associated pericardial calcification.  Numerous reactive sized mediastinal lymph nodes, largest of which measures up to 9 mm in short axis in the low right paratracheal station.  No definite pathologic nodal enlargement. Please note that accurate exclusion of hilar adenopathy is limited on noncontrast CT scans.   Esophagus is unremarkable in appearance.  Lungs/Pleura: Moderate left pleural effusion with near complete passive atelectasis of the left lower lobe.  There is also a small right-sided pleural effusion with some atelectasis and/or consolidation in the right lower lobe.  No  definite suspicious appearing pulmonary  nodules are noted in the aerated portions of the lungs.  Upper Abdomen: Unremarkable.  Musculoskeletal: There are no aggressive appearing lytic or blastic lesions noted in the visualized portions of the skeleton.  IMPRESSION: 1.  Moderate left-sided pleural effusion with near complete passive atelectasis in the left lower lobe.  There is also a small right pleural effusion with some atelectasis and/or consolidation in the right lower lobe. 2.  Moderate amount of pericardial fluid and/or thickening. Clinical correlation for signs and symptoms of acute pericarditis may be warranted.   Original Report Authenticated By: Trudie Reed, M.D.    Dg Chest Port 1 View  11/05/2012  *RADIOLOGY REPORT*  Clinical Data: Central line placement.  Short of breath.  Pleural effusion.  PORTABLE CHEST - 1 VIEW  Comparison: 11/04/2012  Findings: A new left internal jugular center venous catheter is seen with tip overlying the distal SVC.  No evidence of pneumothorax.  Moderate left and small right pleural effusion show no significant change.  Associated bilateral lower lobe infiltrates versus atelectasis are also stable.  Cardiomegaly is unchanged.  IMPRESSION:  1.  New left internal jugular center venous catheter in appropriate position.  No pneumothorax identified. 2.  No significant change and bilateral pleural effusions and bilateral lower lobe infiltrates versus atelectasis, left side greater than right.   Original Report Authenticated By: Myles Rosenthal, M.D.     @ROS @ Constitutional: Negative for fever and chills.  HENT: Negative.  Respiratory: Negative for cough, chest tightness, shortness of breath and wheezing.  Cardiovascular: Positive for chest pain. Negative for palpitations.  Gastrointestinal: Negative for nausea, vomiting and abdominal pain.  Musculoskeletal: Negative for back pain and arthralgias.  Skin: Negative.  Neurological: Negative for dizziness, syncope,  light-headedness and headaches.  Blood pressure 151/98, pulse 92, temperature 98.5 F (36.9 C), temperature source Oral, resp. rate 21, height 5\' 7"  (1.702 m), weight 82.9 kg (182 lb 12.2 oz), last menstrual period 09/16/2010, SpO2 92.00%.  Constitutional: She appears well-developed and well-nourished. Mild respiratory distress.  HENT: Head: Normocephalic and atraumatic. Eyes: Manson Passey, Conjunctivae are normal. No scleral icterus.  Neck: supple. FROM  Cardiovascular: Normal rate, regular rhythm and normal heart sounds. Exam reveals no gallop and no friction rub. II/VI systolic murmur heard.  Pulmonary/Chest: Decreased air entry at bases. She has no wheezes.   Abdominal: Soft. Bowel sounds are normal. There is no tenderness.  Neurological: She is alert and oriented to person, place, and time. Moves all 4 extremities. Skin: Skin is warm and dry.  Psychiatric: She appears anxious.   Assessment/Plan Chest pain Bilateral pleural effusions, left more than right. Trivial pericardial effusion Possible pneumonia R/O Lupus Hypertension CKD, II Anxiety  Continue antibiotics, follow with pulmonary, awaiting ANA.  KADAKIA,AJAY S 11/06/2012, 10:03 AM

## 2012-11-06 NOTE — ED Provider Notes (Signed)
Medical screening examination/treatment/procedure(s) were performed by non-physician practitioner and as supervising physician I was immediately available for consultation/collaboration.   Whitney Plunkett, MD 11/06/12 0822 

## 2012-11-06 NOTE — Progress Notes (Signed)
PGY-1 Daily Progress Note Family Medicine Teaching Service [place] R. Felton Buczynski, DO Service Pager: [phone]   Subjective: Pt feeling short of breath today at rest, with left sided breast pain.    Objective:  VITALS Temp:  [98.4 F (36.9 C)-99.5 F (37.5 C)] 98.5 F (36.9 C) (03/12 0507) Pulse Rate:  [88-97] 92 (03/12 0507) Resp:  [19-21] 21 (03/12 0507) BP: (128-151)/(86-98) 151/98 mmHg (03/12 0507) SpO2:  [90 %-98 %] 92 % (03/12 0507) Weight:  [171 lb 15.3 oz (78 kg)-182 lb 12.2 oz (82.9 kg)] 182 lb 12.2 oz (82.9 kg) (03/11 2239)  In/Out  Intake/Output Summary (Last 24 hours) at 11/06/12 0947 Last data filed at 11/05/12 1900  Gross per 24 hour  Intake    950 ml  Output      2 ml  Net    948 ml    Physical Exam: Gen: mild distress, in bed  HEENT: NCAT, MMM, PERRLA  CV: RRR no mrg  Lungs: tachypnea, decreased breath sounds throughout, crackles LLL Abd: soft/nontender/nondistended/normal bowel sounds  MSK: moves all extremities, trace edmea, no calf tenderness B/L  Skin: warm and dry, no rash  Neuro: grossly normal   MEDS Scheduled Meds: . antiseptic oral rinse  15 mL Mouth Rinse BID  . aspirin EC  81 mg Oral Daily  . buPROPion  50 mg Oral Daily  . heparin subcutaneous  5,000 Units Subcutaneous Q8H  . insulin aspart  0-5 Units Subcutaneous QHS  . insulin aspart  0-9 Units Subcutaneous TID WC  . levofloxacin (LEVAQUIN) IV  750 mg Intravenous Q24H  . nebivolol  5 mg Oral Daily  . polyethylene glycol  17 g Oral Daily  . simvastatin  10 mg Oral q1800  . sodium chloride  10-40 mL Intracatheter Q12H  . sodium chloride  3 mL Intravenous Q12H   Continuous Infusions:  PRN Meds:.morphine injection, ondansetron (ZOFRAN) IV, ondansetron, oxyCODONE-acetaminophen, sodium chloride  Labs and imaging:   CBC  Recent Labs Lab 11/03/12 2305 11/05/12 0951 11/06/12 0500  WBC 12.7* 16.4* 13.6*  HGB 11.7* 11.3* 10.1*  HCT 34.5* 33.0* 29.5*  PLT 410* 337 335    BMET/CMET  Recent Labs Lab 11/04/12 0432 11/05/12 0951 11/06/12 0500  NA 133* 133* 134*  K 4.4 4.2 4.0  CL 96 96 99  CO2 25 26 25   BUN 27* 19 17  CREATININE 1.75* 1.45* 1.25*  CALCIUM 9.8 9.5 9.3  GLUCOSE 197* 205* 166*   Results for orders placed during the hospital encounter of 11/03/12 (from the past 24 hour(s))  CBC WITH DIFFERENTIAL     Status: Abnormal   Collection Time    11/05/12  9:51 AM      Result Value Range   WBC 16.4 (*) 4.0 - 10.5 K/uL   RBC 4.09  3.87 - 5.11 MIL/uL   Hemoglobin 11.3 (*) 12.0 - 15.0 g/dL   HCT 14.7 (*) 82.9 - 56.2 %   MCV 80.7  78.0 - 100.0 fL   MCH 27.6  26.0 - 34.0 pg   MCHC 34.2  30.0 - 36.0 g/dL   RDW 13.0  86.5 - 78.4 %   Platelets 337  150 - 400 K/uL   Neutrophils Relative 85 (*) 43 - 77 %   Neutro Abs 13.9 (*) 1.7 - 7.7 K/uL   Lymphocytes Relative 10 (*) 12 - 46 %   Lymphs Abs 1.6  0.7 - 4.0 K/uL   Monocytes Relative 4  3 - 12 %   Monocytes  Absolute 0.7  0.1 - 1.0 K/uL   Eosinophils Relative 1  0 - 5 %   Eosinophils Absolute 0.2  0.0 - 0.7 K/uL   Basophils Relative 0  0 - 1 %   Basophils Absolute 0.0  0.0 - 0.1 K/uL  BASIC METABOLIC PANEL     Status: Abnormal   Collection Time    11/05/12  9:51 AM      Result Value Range   Sodium 133 (*) 135 - 145 mEq/L   Potassium 4.2  3.5 - 5.1 mEq/L   Chloride 96  96 - 112 mEq/L   CO2 26  19 - 32 mEq/L   Glucose, Bld 205 (*) 70 - 99 mg/dL   BUN 19  6 - 23 mg/dL   Creatinine, Ser 1.61 (*) 0.50 - 1.10 mg/dL   Calcium 9.5  8.4 - 09.6 mg/dL   GFR calc non Af Amer 41 (*) >90 mL/min   GFR calc Af Amer 47 (*) >90 mL/min  GLUCOSE, CAPILLARY     Status: Abnormal   Collection Time    11/05/12 12:02 PM      Result Value Range   Glucose-Capillary 190 (*) 70 - 99 mg/dL  SEDIMENTATION RATE     Status: Abnormal   Collection Time    11/05/12  2:15 PM      Result Value Range   Sed Rate 85 (*) 0 - 22 mm/hr  GLUCOSE, CAPILLARY     Status: Abnormal   Collection Time    11/05/12  4:39 PM       Result Value Range   Glucose-Capillary 179 (*) 70 - 99 mg/dL   Comment 1 Notify RN     Comment 2 Documented in Chart    GLUCOSE, CAPILLARY     Status: Abnormal   Collection Time    11/05/12 10:34 PM      Result Value Range   Glucose-Capillary 110 (*) 70 - 99 mg/dL  CBC WITH DIFFERENTIAL     Status: Abnormal   Collection Time    11/06/12  5:00 AM      Result Value Range   WBC 13.6 (*) 4.0 - 10.5 K/uL   RBC 3.76 (*) 3.87 - 5.11 MIL/uL   Hemoglobin 10.1 (*) 12.0 - 15.0 g/dL   HCT 04.5 (*) 40.9 - 81.1 %   MCV 78.5  78.0 - 100.0 fL   MCH 26.9  26.0 - 34.0 pg   MCHC 34.2  30.0 - 36.0 g/dL   RDW 91.4  78.2 - 95.6 %   Platelets 335  150 - 400 K/uL   Neutrophils Relative 82 (*) 43 - 77 %   Neutro Abs 11.1 (*) 1.7 - 7.7 K/uL   Lymphocytes Relative 10 (*) 12 - 46 %   Lymphs Abs 1.4  0.7 - 4.0 K/uL   Monocytes Relative 6  3 - 12 %   Monocytes Absolute 0.8  0.1 - 1.0 K/uL   Eosinophils Relative 2  0 - 5 %   Eosinophils Absolute 0.3  0.0 - 0.7 K/uL   Basophils Relative 0  0 - 1 %   Basophils Absolute 0.0  0.0 - 0.1 K/uL  BASIC METABOLIC PANEL     Status: Abnormal   Collection Time    11/06/12  5:00 AM      Result Value Range   Sodium 134 (*) 135 - 145 mEq/L   Potassium 4.0  3.5 - 5.1 mEq/L   Chloride 99  96 -  112 mEq/L   CO2 25  19 - 32 mEq/L   Glucose, Bld 166 (*) 70 - 99 mg/dL   BUN 17  6 - 23 mg/dL   Creatinine, Ser 5.40 (*) 0.50 - 1.10 mg/dL   Calcium 9.3  8.4 - 98.1 mg/dL   GFR calc non Af Amer 49 (*) >90 mL/min   GFR calc Af Amer 56 (*) >90 mL/min  GLUCOSE, CAPILLARY     Status: Abnormal   Collection Time    11/06/12  7:45 AM      Result Value Range   Glucose-Capillary 148 (*) 70 - 99 mg/dL   Ct Chest Wo Contrast  11/04/2012  *RADIOLOGY REPORT*  Clinical Data: Evaluate pleural effusion.  CT CHEST WITHOUT CONTRAST  Technique:  Multidetector CT imaging of the chest was performed following the standard protocol without IV contrast.  Comparison: No priors.  Findings:   Mediastinum: Heart size is borderline enlarged. Moderate volume of pericardial fluid and/or thickening.  No associated pericardial calcification.  Numerous reactive sized mediastinal lymph nodes, largest of which measures up to 9 mm in short axis in the low right paratracheal station.  No definite pathologic nodal enlargement. Please note that accurate exclusion of hilar adenopathy is limited on noncontrast CT scans.   Esophagus is unremarkable in appearance.  Lungs/Pleura: Moderate left pleural effusion with near complete passive atelectasis of the left lower lobe.  There is also a small right-sided pleural effusion with some atelectasis and/or consolidation in the right lower lobe.  No definite suspicious appearing pulmonary nodules are noted in the aerated portions of the lungs.  Upper Abdomen: Unremarkable.  Musculoskeletal: There are no aggressive appearing lytic or blastic lesions noted in the visualized portions of the skeleton.  IMPRESSION: 1.  Moderate left-sided pleural effusion with near complete passive atelectasis in the left lower lobe.  There is also a small right pleural effusion with some atelectasis and/or consolidation in the right lower lobe. 2.  Moderate amount of pericardial fluid and/or thickening. Clinical correlation for signs and symptoms of acute pericarditis may be warranted.   Original Report Authenticated By: Trudie Reed, M.D.    Dg Chest Port 1 View  11/05/2012  *RADIOLOGY REPORT*  Clinical Data: Central line placement.  Short of breath.  Pleural effusion.  PORTABLE CHEST - 1 VIEW  Comparison: 11/04/2012  Findings: A new left internal jugular center venous catheter is seen with tip overlying the distal SVC.  No evidence of pneumothorax.  Moderate left and small right pleural effusion show no significant change.  Associated bilateral lower lobe infiltrates versus atelectasis are also stable.  Cardiomegaly is unchanged.  IMPRESSION:  1.  New left internal jugular center venous  catheter in appropriate position.  No pneumothorax identified. 2.  No significant change and bilateral pleural effusions and bilateral lower lobe infiltrates versus atelectasis, left side greater than right.   Original Report Authenticated By: Myles Rosenthal, M.D.     ECHO 11/06/12 Study Conclusions  - Left ventricle: The cavity size was normal. There was mild concentric hypertrophy. Systolic function was normal. The estimated ejection fraction was in the range of 60% to 65%. Wall motion was normal; there were no regional wall motion abnormalities. Doppler parameters are consistent with abnormal left ventricular relaxation (grade 1 diastolic dysfunction). - Pericardium, extracardiac: A trivial pericardial effusion was identified posterior to the heart. There was a large left pleural effusion.   Assessment  Pt is a 52 y.o. patient with significant PMHx for Depression, HTN admitted with  chest pain and shortness of breath    Plan:   1. Chest Pain secondary to HCAP vs possible PE vs pericarditis secondary to possible rheumatologic condition - Cath done end of February 2014 without evidence of CAD  1) WBC decrease from 16 to 13.6 this AM.  Day 2 of Levaquin, switched from Rocephin yesterday   2) CT chest w/o contrast shows left sided pleural effusion with possible pericardial fluid.  Consult to PCCM for both thoracentesis of left pleural fluid and central line placement yesterday.  Unable to perform thoracentesis and IR unable to do this as well. Formal consult to both PCCM and cardiology for further input today.   3) Repeat CBC daily, continue pulse ox and O2 as needed (2 L last night)   4) Consider CTA for PE if pt does not improve despite thoracentesis   5) 2D ECHO with minimal amont of posterior pericardial fluid , F/U ANA for possible pericarditis secondary to Lupus.  ESR elevated to 85.    2. Hyperlipidemia - Continue Pravachol   3. DM II - Continue SSI.  Will stop Glipizide on D/C on d/c    4. Depression - Continue Wellbutrin   5. HTN - Continue home meds  6. AKI on CKD Stage III - Creatinine stable around 1.3, baseline   1) Recheck BMP daily   FEN/GI:  Heart Healthy, NO peripheral access, Central Line in place Prophylaxis:  SQ Heparin  Disposition: Pending clinical improvement  Code Status: Full   Reagen Goates R. Paulina Fusi, DO of [facility] 11/06/2012, 8:06 AM

## 2012-11-06 NOTE — Consult Note (Signed)
Name: Cynthia Cameron DOB: 08-07-1961 MRN: [id number] PCP: Abbe Amsterdam, MD ADMIT DATE: 11/03/2012 LOS: 3 Consult requeste by Dr Mauricio Po of [facility] PCCM CONSULT NOTE  HPI Cynthia Cameron is a 52 y.o. year old female presenting with chest pain, SOB and cough.  Chest pain and SOB now present for 2 weeks. Seen and admitted by cardiology on 2/27 due to EKG with nonspecific EKG changes. CXR before d/c showed likely atelectasis at lung bases. Patient was discharged after she had a negative heart catheterization. Since that time, she has developed a cough which has worsened her chest pain and changed its position. Previously "ton of bricks over her right chest" now spread to her left chest as well. Patient denies fever/chills but endorses cough productive of yellow sputum. Cough has exacerbated chest pain. SOB and chest pain limit her from walking to her mail box. Patient is overall exhauseted as well. Chest pain is also pleuritic and severely painful 10/10. Exertional stress worsens pain but not relieved by rest. No change in pain since catheterization.  Patient has had poor PO as she has lost power and has had low appetite due to her chset pain. She noted a CBG of 29 at home this morning after she felt that she was overall weak. CBG up to 140 after 3 spoons of syrup after 15 minutes. Within the hour, CBG 85 again.  Patient seen at [facility] 11/04/12  and was noted to have a LLL pneumonia and given poorly controlled CBGs and persistent chest pain was sent to ED with plan for admission. Already received ceftriaxone at Urgent Care  PCCM attemtpted thoracentesis elft side but was dry tap on 11/05/12. ON 11/06/12 due to persistent symptoms consult called   She has family member with SLE Past Medical History  Diagnosis Date  . Allergy   . Depression   . Diabetes mellitus without complication   . Hypertension   . Pneumonia 10/2012     Family History  Problem Relation Age of Onset  .  Hypertension Father   . Diabetes Mother   . Atrial fibrillation Mother      History   Social History  . Marital Status: Married    Spouse Name: N/A    Number of Children: N/A  . Years of Education: N/A   Occupational History  . Not on file.   Social History Main Topics  . Smoking status: Never Smoker   . Smokeless tobacco: Never Used  . Alcohol Use: Yes     Comment: social  . Drug Use: No  . Sexually Active: Yes   Other Topics Concern  . Not on file   Social History Narrative   LIves in [place]. 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 Lives with husband and 2 sons.      No Known Allergies    (Not in an outpatient encounter)    Micro: No results found for this basename: LATICACIDVEN, PCT,  in the last 168 hours No results found for this or any previous visit (from the past 240 hour(s)).  Antibiotics: Anti-infectives   Start     Dose/Rate Route Frequency Ordered Stop   11/05/12 1400  levofloxacin (LEVAQUIN) IVPB 750 mg     750 mg 100 mL/hr over 90 Minutes Intravenous Every 24 hours 11/05/12 1313     11/05/12 0600  cefTRIAXone (ROCEPHIN) injection 1 g  Status:  Discontinued     1 g Intramuscular Every 24 hours 11/04/12 1512 11/05/12 1054   11/04/12 0445  cefTRIAXone (ROCEPHIN) 1 g in dextrose 5 % 50 mL IVPB  Status:  Discontinued     1 g 100 mL/hr over 30 Minutes Intravenous Every 24 hours 11/04/12 0436 11/04/12 1512   11/04/12 0215  cefTRIAXone (ROCEPHIN) injection 1 g  Status:  Discontinued     1 g Intramuscular Every 24 hours 11/04/12 0211 11/04/12 0436        Vital Signs: Temp:  [97.9 F (36.6 C)-99.5 F (37.5 C)] 97.9 F (36.6 C) (03/12 1026) Pulse Rate:  [88-97] 96 (03/12 1026) Resp:  [19-21] 20 (03/12 1026) BP: (128-158)/(89-110) 158/110 mmHg (03/12 1026) SpO2:  [89 %-97 %] 89 % (03/12 1026) Weight:  [82.9 kg (182 lb 12.2 oz)] 82.9 kg (182 lb 12.2 oz) (03/11 2239) I/O last 3 completed shifts: In: 1193 [P.O.:840; I.V.:3; Other:250; IV  Piggyback:100] Out: 252 [Urine:252]  Physical Examination: General: looks unweell due to pain. Sitting erect Neuro:  GCS 15. RASS 0. Oriented x 3. CAm-ICU negative for delirium HEENT:  Supple neck, Neck:    PEERL + Cardiovascular:  Sinus Chest:Normal chest contour Lungs:  COarse, low volume ventilation Abdomen:  Soft, non tender Musculoskeletal:  ? Mild edmea Skin:  intact Extremities:no msk issues  /Radiology: x 48h  Dg Chest Port 1 View  11/05/2012  *RADIOLOGY REPORT*  Clinical Data: Central line placement.  Short of breath.  Pleural effusion.  PORTABLE CHEST - 1 VIEW  Comparison: 11/04/2012  Findings: A new left internal jugular center venous catheter is seen with tip overlying the distal SVC.  No evidence of pneumothorax.  Moderate left and small right pleural effusion show no significant change.  Associated bilateral lower lobe infiltrates versus atelectasis are also stable.  Cardiomegaly is unchanged.  IMPRESSION:  1.  New left internal jugular center venous catheter in appropriate position.  No pneumothorax identified. 2.  No significant change and bilateral pleural effusions and bilateral lower lobe infiltrates versus atelectasis, left side greater than right.   Original Report Authenticated By: Myles Rosenthal, M.D.    LABS -  Recent Labs Lab 11/03/12 2305 11/05/12 0951 11/06/12 0500  HGB 11.7* 11.3* 10.1*  HCT 34.5* 33.0* 29.5*  WBC 12.7* 16.4* 13.6*  PLT 410* 337 335    Recent Labs Lab 11/03/12 2305 11/04/12 0432 11/05/12 0951 11/06/12 0500  NA 135 133* 133* 134*  K 4.4 4.4 4.2 4.0  CL 98 96 96 99  CO2 25 25 26 25   GLUCOSE 142* 197* 205* 166*  BUN 26* 27* 19 17  CREATININE 1.72* 1.75* 1.45* 1.25*  CALCIUM 10.1 9.8 9.5 9.3     Recent Labs Lab 11/03/12 2305  TROPONINI <0.30    Recent Labs Lab 11/04/12 0432  PROBNP 319.2*   No results found for this basename: LATICACIDVEN, PROCALCITON,  in the last 168 hours   ASSESSMENT AND PLAN  RESPIRATORY No  results found for this basename: PHART, PCO2, PCO2ART, PO2ART, HCO3, O2SAT,  in the last 168 hours A:   - Dyspnea - multifactorial due to effusion and pericardial effusion - Small left pleural effusion, Dry tap by pulmonary 11/05/12. ? Viral, ? autoimmune P: - asked [facility] to contact IR to see if they can do thora  - await autoimmune but wil add RF, DS DNA and ccp  - check lactic, pct and abg stat - check duplex LE for DVT 0 indrect for PE  CARDIAC  Recent Labs Lab 11/03/12 2305 11/04/12 0432  TROPONINI <0.30  --   PROBNP  --  319.2*  A: small pericardial effusion P: Per cards   Dr. Kalman Shan, M.D., [facility].C.P Pulmonary and Critical Care Medicine Staff Physician [facility] [place] [facility] Pager: [phone], If no answer or between  15:00h - 7:00h: call [phone]  11/06/2012 12:45 PM

## 2012-11-06 NOTE — Progress Notes (Signed)
[facility]MTS Attending Note Patient seen and examined byme, discussed with resident team and I agree with Dr Paulina Fusi' note for today.  [name] appears more uncomfortable this morning, still dyspneic.  Of note, her renal function does appear to be improving.  A/P: Patient with chest pain and dyspnea, hypoxemia, which all may be related to her pleural effusion.  ECHO shows trivial pericardial effusion, and ANA is negative (highly sensitive for SLE, thus making lupus highly unlikely if ANA is negative).  Unsuccessful thoracocentesis for pleural fluid analysis and culture. Among the items in our differential are effusion due to infection, rheumatologic condition (SLE, RA) or malignancy, as well as PE.  Inadequate IV access and her initial presentation with renal insufficiency prevented CTA to rule out PE.  Will readdress this now, in light of central line access and marked improvement in her serum creatinine today.  Will still need to determine how to get a meaningful tap of pleural fluid, both for diagnostic and therapeutic reasons.  Concern for loculated effusion. Appreciate CCM consult. [name], MD

## 2012-11-07 ENCOUNTER — Inpatient Hospital Stay (HOSPITAL_COMMUNITY): Payer: 59

## 2012-11-07 DIAGNOSIS — I82409 Acute embolism and thrombosis of unspecified deep veins of unspecified lower extremity: Secondary | ICD-10-CM | POA: Diagnosis present

## 2012-11-07 DIAGNOSIS — I2699 Other pulmonary embolism without acute cor pulmonale: Secondary | ICD-10-CM | POA: Diagnosis present

## 2012-11-07 LAB — BASIC METABOLIC PANEL
BUN: 20 mg/dL (ref 6–23)
CO2: 24 mEq/L (ref 19–32)
Calcium: 9.5 mg/dL (ref 8.4–10.5)
Creatinine, Ser: 1.32 mg/dL — ABNORMAL HIGH (ref 0.50–1.10)
Glucose, Bld: 188 mg/dL — ABNORMAL HIGH (ref 70–99)

## 2012-11-07 LAB — CBC WITH DIFFERENTIAL/PLATELET
Basophils Absolute: 0 10*3/uL (ref 0.0–0.1)
Eosinophils Relative: 1 % (ref 0–5)
HCT: 30.6 % — ABNORMAL LOW (ref 36.0–46.0)
Lymphocytes Relative: 10 % — ABNORMAL LOW (ref 12–46)
MCV: 80.3 fL (ref 78.0–100.0)
Monocytes Absolute: 1.1 10*3/uL — ABNORMAL HIGH (ref 0.1–1.0)
Monocytes Relative: 7 % (ref 3–12)
RDW: 14.6 % (ref 11.5–15.5)
WBC: 15.4 10*3/uL — ABNORMAL HIGH (ref 4.0–10.5)

## 2012-11-07 LAB — GLUCOSE, CAPILLARY: Glucose-Capillary: 179 mg/dL — ABNORMAL HIGH (ref 70–99)

## 2012-11-07 LAB — ANTI-DNA ANTIBODY, DOUBLE-STRANDED: ds DNA Ab: <1 IU/mL (ref <30)

## 2012-11-07 MED ORDER — SODIUM CHLORIDE 0.9 % IV BOLUS (SEPSIS)
500.0000 mL | Freq: Once | INTRAVENOUS | Status: AC
  Administered 2012-11-07: 500 mL via INTRAVENOUS

## 2012-11-07 MED ORDER — IOHEXOL 350 MG/ML SOLN
100.0000 mL | Freq: Once | INTRAVENOUS | Status: AC | PRN
  Administered 2012-11-07: 100 mL via INTRAVENOUS

## 2012-11-07 MED ORDER — AMLODIPINE BESYLATE 5 MG PO TABS: 5.0000 mg | ORAL_TABLET | Freq: Every day | ORAL | Status: DC

## 2012-11-07 MED ORDER — HYDRALAZINE HCL 20 MG/ML IJ SOLN
5.0000 mg | INTRAMUSCULAR | Status: DC | PRN
  Administered 2012-11-07 – 2012-11-08 (×2): 5 mg via INTRAVENOUS
  Filled 2012-11-07 (×4): qty 0.25

## 2012-11-07 MED ORDER — LEVOFLOXACIN 750 MG PO TABS
750.0000 mg | ORAL_TABLET | Freq: Every day | ORAL | Status: DC
  Administered 2012-11-07 – 2012-11-13 (×8): 750 mg via ORAL
  Filled 2012-11-07 (×7): qty 1

## 2012-11-07 MED ORDER — AMLODIPINE BESYLATE 5 MG PO TABS
5.0000 mg | ORAL_TABLET | Freq: Every day | ORAL | Status: DC
  Administered 2012-11-08 – 2012-11-09 (×3): 5 mg via ORAL
  Filled 2012-11-07 (×4): qty 1

## 2012-11-07 NOTE — Progress Notes (Signed)
Pt bps running 170s/100s, MD notified. Will continue to monitor.

## 2012-11-07 NOTE — Progress Notes (Signed)
Name: Cynthia Cameron DOB: 1960-10-16 MRN: [id number] PCP: Abbe Amsterdam, MD ADMIT DATE: 11/03/2012 LOS: 4 Consult requeste by Dr Mauricio Po of [facility] PCCM CONSULT NOTE  HPI Cynthia Cameron is a 52 y.o. year old female presenting with chest pain, SOB and cough.  Chest pain and SOB now present for 2 weeks. Seen and admitted by cardiology on 2/27 due to EKG with nonspecific EKG changes. CXR before d/c showed likely atelectasis at lung bases. Patient was discharged after she had a negative heart catheterization. Since that time, she has developed a cough which has worsened her chest pain and changed its position. Previously "ton of bricks over her right chest" now spread to her left chest as well. Patient denies fever/chills but endorses cough productive of yellow sputum. Cough has exacerbated chest pain. SOB and chest pain limit her from walking to her mail box. Patient is overall exhauseted as well. Chest pain is also pleuritic and severely painful 10/10. Exertional stress worsens pain but not relieved by rest. No change in pain since catheterization.  Patient has had poor PO as she has lost power and has had low appetite due to her chset pain. She noted a CBG of 29 at home this morning after she felt that she was overall weak. CBG up to 140 after 3 spoons of syrup after 15 minutes. Within the hour, CBG 85 again.  Patient seen at [facility] 11/04/12  and was noted to have a LLL pneumonia and given poorly controlled CBGs and persistent chest pain was sent to ED with plan for admission. Already received ceftriaxone at Urgent Care  PCCM attemtpted thoracentesis elft side but was dry tap on 11/05/12. ON 11/06/12 due to persistent symptoms consult called   Micro:  Recent Labs Lab 11/06/12 1240  LATICACIDVEN 1.0   No results found for this or any previous visit (from the past 240 hour(s)).  Antibiotics: Anti-infectives   Start     Dose/Rate Route Frequency Ordered Stop   11/07/12 1200   levofloxacin (LEVAQUIN) tablet 750 mg     750 mg Oral Daily 11/07/12 0858     11/05/12 1400  levofloxacin (LEVAQUIN) IVPB 750 mg  Status:  Discontinued     750 mg 100 mL/hr over 90 Minutes Intravenous Every 24 hours 11/05/12 1313 11/07/12 0857   11/05/12 0600  cefTRIAXone (ROCEPHIN) injection 1 g  Status:  Discontinued     1 g Intramuscular Every 24 hours 11/04/12 1512 11/05/12 1054   11/04/12 0445  cefTRIAXone (ROCEPHIN) 1 g in dextrose 5 % 50 mL IVPB  Status:  Discontinued     1 g 100 mL/hr over 30 Minutes Intravenous Every 24 hours 11/04/12 0436 11/04/12 1512   11/04/12 0215  cefTRIAXone (ROCEPHIN) injection 1 g  Status:  Discontinued     1 g Intramuscular Every 24 hours 11/04/12 0211 11/04/12 0436       SUBJECTIVE/OVERNIGHT/INTERVAL HX 11/07/12: Autoimmune negative. Doppler positive for DVT. FPTS started patient on xarelto. Pharmacy says current renal function ok to do xareltp. She feels some better  Vital Signs: Temp:  [98 F (36.7 C)-98.7 F (37.1 C)] 98.3 F (36.8 C) (03/13 1108) Pulse Rate:  [101-107] 101 (03/13 0523) Resp:  [20] 20 (03/13 1108) BP: (153-173)/(106-116) 173/116 mmHg (03/13 1108) SpO2:  [87 %-96 %] 96 % (03/13 1108) Weight:  [83.5 kg (184 lb 1.4 oz)] 83.5 kg (184 lb 1.4 oz) (03/12 2206) I/O last 3 completed shifts: In: -  Out: 300 [Urine:300]  Physical Examination: General:  looks unweell due to pain. Sitting erect Neuro:  GCS 15. RASS 0. Oriented x 3. CAm-ICU negative for delirium HEENT:  Supple neck, Neck:    PEERL + Cardiovascular:  Sinus Chest:Normal chest contour Lungs:  COarse, low volume ventilation Abdomen:  Soft, non tender Musculoskeletal:  ? Mild edmea Skin:  intact Extremities:no msk issues  /Radiology: x 48h  Dg Chest Port 1 View  11/05/2012  *RADIOLOGY REPORT*  Clinical Data: Central line placement.  Short of breath.  Pleural effusion.  PORTABLE CHEST - 1 VIEW  Comparison: 11/04/2012  Findings: A new left internal jugular center  venous catheter is seen with tip overlying the distal SVC.  No evidence of pneumothorax.  Moderate left and small right pleural effusion show no significant change.  Associated bilateral lower lobe infiltrates versus atelectasis are also stable.  Cardiomegaly is unchanged.  IMPRESSION:  1.  New left internal jugular center venous catheter in appropriate position.  No pneumothorax identified. 2.  No significant change and bilateral pleural effusions and bilateral lower lobe infiltrates versus atelectasis, left side greater than right.   Original Report Authenticated By: Myles Rosenthal, M.D.    LABS -  Recent Labs Lab 11/05/12 [id number] 11/06/12 0500 11/07/12 0600  HGB 11.3* 10.1* 10.4*  HCT 33.0* 29.5* 30.6*  WBC 16.4* 13.6* 15.4*  PLT 337 335 333    Recent Labs Lab 11/03/12 2305 11/04/12 0432 11/05/12 0951 11/06/12 0500 11/07/12 0600  NA 135 133* 133* 134* 136  K 4.4 4.4 4.2 4.0 4.8  CL 98 96 96 99 100  CO2 25 25 26 25 24   GLUCOSE 142* 197* 205* 166* 188*  BUN 26* 27* 19 17 20   CREATININE 1.72* 1.75* 1.45* 1.25* 1.32*  CALCIUM 10.1 9.8 9.5 9.3 9.5      Recent Labs Lab 11/03/12 2305  TROPONINI <0.30    Recent Labs Lab 11/04/12 0432  PROBNP 319.2*    Recent Labs Lab 11/06/12 1240  LATICACIDVEN 1.0  PROCALCITON 0.16     ASSESSMENT AND PLAN  RESPIRATORY  Recent Labs Lab 11/06/12 1530  PHART 7.381  PCO2ART 40.0  PO2ART 54.2*  HCO3 23.2  O2SAT 87.4   A:   - Dyspnea - multifactorial due to effusion and pericardial effusion - Small left pleural effusion, Dry tap by pulmonary 11/05/12. ? Viral, ? Autoimmune - 11/07/12: tests reveal DVT. So presumed PE (high pretest prob). Autoimune neative P: -anticoagulation per FPTS; duration 6-48 months depending on etiology and course - watch closely - PCCM will sign off CARDIAC  Recent Labs Lab 11/03/12 2305 11/04/12 0432  TROPONINI <0.30  --   PROBNP  --  319.2*   A: small pericardial effusion P: Per  cards    Dr. Kalman Shan, M.D., [facility].C.P Pulmonary and Critical Care Medicine Staff Physician  System [place] [facility] and [facility] Pager: [phone], If no answer or between  15:00h - 7:00h: call [phone]  11/07/2012 11:27 AM

## 2012-11-07 NOTE — Progress Notes (Signed)
FMTS Attending Note Patient seen and examined by me, discussed with resident team and I agree with Dr Paulina Fusi' note.  Patient reports feeling somewhat better today; less pain and reports less dyspnea, although still with oxygen requirement.  Lower extremity DVT makes PE a likely underlying cause of her effusion and dyspnea.  To characterize with CT angio. Regarding possible predisposing factors for VTE, patient reports being up to date on screening mammography.  To review other cancer screening and family history for risk factors, ensure adequate counseling and recommendations for age- and sex-appropriate cancer screening. [name], MD

## 2012-11-07 NOTE — Progress Notes (Signed)
PT Cancellation Note  Patient Details Name: Cynthia Cameron MRN: [id number] DOB: 1961-05-06   Cancelled Treatment:    Reason Eval/Treat Not Completed: Medical issues which prohibited therapy (pt with elevated systolic and diastolic BP ) Will check back tomorrow   Donnetta Hail 11/07/2012, 2:08 PM

## 2012-11-07 NOTE — Consult Note (Signed)
Subjective:  Shortness of breath continues with left leg DVT. Elevated blood presure.  Objective:  Vital Signs in the last 24 hours: Temp:  [98.3 F (36.8 C)-98.8 F (37.1 C)] 98.8 F (37.1 C) (03/13 2213) Pulse Rate:  [96-104] 96 (03/13 2213) Cardiac Rhythm:  [-] Sinus tachycardia (03/13 1145) Resp:  [20-21] 21 (03/13 2213) BP: (161-173)/(106-121) 168/109 mmHg (03/13 2213) SpO2:  [87 %-96 %] 95 % (03/13 2213)  Physical Exam: BP Readings from Last 1 Encounters:  11/07/12 168/109     Wt Readings from Last 1 Encounters:  11/06/12 83.5 kg (184 lb 1.4 oz)    Weight change: 5.5 kg (12 lb 2 oz)  HEENT: [place]/AT, Eyes-Brown, PERL, EOMI, Conjunctiva-Pink, Sclera-Non-icteric Neck: No JVD, No bruit, Trachea midline. Lungs:  Decreased air entry at bases, Bilateral. Cardiac:  Regular rhythm, normal S1 and S2, no S3.  Abdomen:  Soft, non-tender. Extremities:  No edema present. No cyanosis. No clubbing. CNS: AxOx3, Cranial nerves grossly intact, moves all 4 extremities. Right handed. Skin: Warm and dry.   Intake/Output from previous day: 03/12 0701 - 03/13 0700 In: -  Out: 300 [Urine:300]    Lab Results: BMET    Component Value Date/Time   NA 136 11/07/2012 0600   K 4.8 11/07/2012 0600   CL 100 11/07/2012 0600   CO2 24 11/07/2012 0600   GLUCOSE 188* 11/07/2012 0600   BUN 20 11/07/2012 0600   CREATININE 1.32* 11/07/2012 0600   CREATININE 1.50* 01/29/2012 1616   CREATININE 1.50* 01/29/2012 1616   CALCIUM 9.5 11/07/2012 0600   GFRNONAA 45* 11/07/2012 0600   GFRAA 53* 11/07/2012 0600   CBC    Component Value Date/Time   WBC 15.4* 11/07/2012 0600   WBC 12.9* 11/03/2012 1723   RBC 3.81* 11/07/2012 0600   RBC 4.40 11/03/2012 1723   HGB 10.4* 11/07/2012 0600   HGB 11.6* 11/03/2012 1723   HCT 30.6* 11/07/2012 0600   HCT 36.8* 11/03/2012 1723   PLT 333 11/07/2012 0600   MCV 80.3 11/07/2012 0600   MCV 83.6 11/03/2012 1723   MCH 27.3 11/07/2012 0600   MCH 26.4* 11/03/2012 1723   MCHC 34.0 11/07/2012 0600   MCHC 31.5* 11/03/2012 1723   RDW 14.6 11/07/2012 0600   LYMPHSABS 1.5 11/07/2012 0600   MONOABS 1.1* 11/07/2012 0600   EOSABS 0.2 11/07/2012 0600   BASOSABS 0.0 11/07/2012 0600   CARDIAC ENZYMES Lab Results  Component Value Date   TROPONINI <0.30 11/03/2012    Scheduled Meds: . antiseptic oral rinse  15 mL Mouth Rinse BID  . aspirin EC  81 mg Oral Daily  . buPROPion  50 mg Oral Daily  . insulin aspart  0-5 Units Subcutaneous QHS  . insulin aspart  0-9 Units Subcutaneous TID WC  . levofloxacin  750 mg Oral Daily  . nebivolol  5 mg Oral Daily  . polyethylene glycol  17 g Oral Daily  . rivaroxaban  15 mg Oral BID WC  . simvastatin  10 mg Oral q1800  . sodium chloride  10-40 mL Intracatheter Q12H  . sodium chloride  3 mL Intravenous Q12H   Continuous Infusions:  PRN Meds:.hydrALAZINE, morphine injection, ondansetron (ZOFRAN) IV, ondansetron, oxyCODONE-acetaminophen, sodium chloride  Assessment/Plan: DVT Chest pain  Bilateral pleural effusions, left more than right.  Trivial pericardial effusion  Possible pneumonia Possible pulmonary embolism  Hypertension  CKD, II  Anxiety  Add Amlodipine.   LOS: 4 days    Orpah Cobb  MD  11/07/2012, 10:37 PM

## 2012-11-07 NOTE — Progress Notes (Signed)
PGY-1 Daily Progress Note Family Medicine Teaching Service [place] R. Hess, DO Service Pager: [phone]   Subjective: Pt actually feeling better than yesterday and would like to get up and walk.     Objective:  VITALS Temp:  [97.9 F (36.6 C)-98.7 F (37.1 C)] 98.7 F (37.1 C) (03/13 0523) Pulse Rate:  [96-107] 101 (03/13 0523) Resp:  [20] 20 (03/13 0523) BP: (153-166)/(106-112) 161/106 mmHg (03/13 0523) SpO2:  [87 %-89 %] 87 % (03/13 0523) Weight:  [184 lb 1.4 oz (83.5 kg)] 184 lb 1.4 oz (83.5 kg) (03/12 2206)  In/Out  Intake/Output Summary (Last 24 hours) at 11/07/12 4540 Last data filed at 11/06/12 1235  Gross per 24 hour  Intake      0 ml  Output    300 ml  Net   -300 ml    Physical Exam: Gen: mild distress, in bed on [place] 3.5 L HEENT: NCAT, MMM, PERRLA  CV: Tachycardic, no murmur appreciate, regular rhythm  Lungs: tachypnea, decreased breath sounds throughout, no wheezing or crackles today  Abd: soft/nontender/nondistended/normal bowel sounds  MSK: moves all extremities, trace edmea, no calf tenderness B/L  Skin: warm and dry, no rash  Neuro: grossly normal   MEDS Scheduled Meds: . antiseptic oral rinse  15 mL Mouth Rinse BID  . aspirin EC  81 mg Oral Daily  . buPROPion  50 mg Oral Daily  . insulin aspart  0-5 Units Subcutaneous QHS  . insulin aspart  0-9 Units Subcutaneous TID WC  . levofloxacin (LEVAQUIN) IV  750 mg Intravenous Q24H  . nebivolol  5 mg Oral Daily  . polyethylene glycol  17 g Oral Daily  . rivaroxaban  15 mg Oral BID WC  . simvastatin  10 mg Oral q1800  . sodium chloride  10-40 mL Intracatheter Q12H  . sodium chloride  3 mL Intravenous Q12H   Continuous Infusions:  PRN Meds:.morphine injection, ondansetron (ZOFRAN) IV, ondansetron, oxyCODONE-acetaminophen, sodium chloride  Labs and imaging:   CBC  Recent Labs Lab 11/05/12 0951 11/06/12 0500 11/07/12 0600  WBC 16.4* 13.6* 15.4*  HGB 11.3* 10.1* 10.4*  HCT 33.0* 29.5* 30.6*  PLT  337 335 333   BMET/CMET  Recent Labs Lab 11/05/12 0951 11/06/12 0500 11/07/12 0600  NA 133* 134* 136  K 4.2 4.0 4.8  CL 96 99 100  CO2 26 25 24   BUN 19 17 20   CREATININE 1.45* 1.25* 1.32*  CALCIUM 9.5 9.3 9.5  GLUCOSE 205* 166* 188*   Results for orders placed during the hospital encounter of 11/03/12 (from the past 24 hour(s))  GLUCOSE, CAPILLARY     Status: Abnormal   Collection Time    11/06/12 12:28 PM      Result Value Range   Glucose-Capillary 138 (*) 70 - 99 mg/dL  RHEUMATOID FACTOR     Status: Abnormal   Collection Time    11/06/12 12:30 PM      Result Value Range   Rheumatoid Factor 22 (*) <=14 IU/mL  LACTIC ACID, PLASMA     Status: None   Collection Time    11/06/12 12:40 PM      Result Value Range   Lactic Acid, Venous 1.0  0.5 - 2.2 mmol/L  PROCALCITONIN     Status: None   Collection Time    11/06/12 12:40 PM      Result Value Range   Procalcitonin 0.16    BLOOD GAS, ARTERIAL     Status: Abnormal   Collection Time  11/06/12  3:30 PM      Result Value Range   O2 Content 2.0     Delivery systems NASAL CANNULA     pH, Arterial 7.381  7.350 - 7.450   pCO2 arterial 40.0  35.0 - 45.0 mmHg   pO2, Arterial 54.2 (*) 80.0 - 100.0 mmHg   Bicarbonate 23.2  20.0 - 24.0 mEq/L   TCO2 24.5  0 - 100 mmol/L   Acid-base deficit 1.2  0.0 - 2.0 mmol/L   O2 Saturation 87.4     Patient temperature 98.3     Collection site RIGHT RADIAL     Drawn by [id number]     Sample type ARTERIAL DRAW     Allens test (pass/fail) PASS  PASS  GLUCOSE, CAPILLARY     Status: Abnormal   Collection Time    11/06/12  4:59 PM      Result Value Range   Glucose-Capillary 182 (*) 70 - 99 mg/dL  GLUCOSE, CAPILLARY     Status: Abnormal   Collection Time    11/06/12 10:11 PM      Result Value Range   Glucose-Capillary 164 (*) 70 - 99 mg/dL  CBC WITH DIFFERENTIAL     Status: Abnormal   Collection Time    11/07/12  6:00 AM      Result Value Range   WBC 15.4 (*) 4.0 - 10.5 K/uL   RBC 3.81  (*) 3.87 - 5.11 MIL/uL   Hemoglobin 10.4 (*) 12.0 - 15.0 g/dL   HCT 16.1 (*) 09.6 - 04.5 %   MCV 80.3  78.0 - 100.0 fL   MCH 27.3  26.0 - 34.0 pg   MCHC 34.0  30.0 - 36.0 g/dL   RDW 40.9  81.1 - 91.4 %   Platelets 333  150 - 400 K/uL   Neutrophils Relative 82 (*) 43 - 77 %   Neutro Abs 12.6 (*) 1.7 - 7.7 K/uL   Lymphocytes Relative 10 (*) 12 - 46 %   Lymphs Abs 1.5  0.7 - 4.0 K/uL   Monocytes Relative 7  3 - 12 %   Monocytes Absolute 1.1 (*) 0.1 - 1.0 K/uL   Eosinophils Relative 1  0 - 5 %   Eosinophils Absolute 0.2  0.0 - 0.7 K/uL   Basophils Relative 0  0 - 1 %   Basophils Absolute 0.0  0.0 - 0.1 K/uL  BASIC METABOLIC PANEL     Status: Abnormal   Collection Time    11/07/12  6:00 AM      Result Value Range   Sodium 136  135 - 145 mEq/L   Potassium 4.8  3.5 - 5.1 mEq/L   Chloride 100  96 - 112 mEq/L   CO2 24  19 - 32 mEq/L   Glucose, Bld 188 (*) 70 - 99 mg/dL   BUN 20  6 - 23 mg/dL   Creatinine, Ser 7.82 (*) 0.50 - 1.10 mg/dL   Calcium 9.5  8.4 - 95.6 mg/dL   GFR calc non Af Amer 45 (*) >90 mL/min   GFR calc Af Amer 53 (*) >90 mL/min   Dg Chest Port 1 View  11/05/2012  *RADIOLOGY REPORT*  Clinical Data: Central line placement.  Short of breath.  Pleural effusion.  PORTABLE CHEST - 1 VIEW  Comparison: 11/04/2012  Findings: A new left internal jugular center venous catheter is seen with tip overlying the distal SVC.  No evidence of pneumothorax.  Moderate left and small right  pleural effusion show no significant change.  Associated bilateral lower lobe infiltrates versus atelectasis are also stable.  Cardiomegaly is unchanged.  IMPRESSION:  1.  New left internal jugular center venous catheter in appropriate position.  No pneumothorax identified. 2.  No significant change and bilateral pleural effusions and bilateral lower lobe infiltrates versus atelectasis, left side greater than right.   Original Report Authenticated By: Myles Rosenthal, M.D.     ECHO 11/06/12 Study Conclusions  -  Left ventricle: The cavity size was normal. There was mild concentric hypertrophy. Systolic function was normal. The estimated ejection fraction was in the range of 60% to 65%. Wall motion was normal; there were no regional wall motion abnormalities. Doppler parameters are consistent with abnormal left ventricular relaxation (grade 1 diastolic dysfunction). - Pericardium, extracardiac: A trivial pericardial effusion was identified posterior to the heart. There was a large left pleural effusion.   Assessment  Pt is a 52 y.o. patient with significant PMHx for Depression, HTN admitted with chest pain and shortness of breath    Plan:   1. Chest Pain secondary to HCAP vs possible PE more likely after DVT on LE [place] vs pericarditis secondary to possible rheumatologic condition - Cath done end of February 2014 without evidence of CAD  1) WBC between 13-16 since admission.  Day 3 of Levaquin, switched from Rocephin   2) CT chest w/o contrast shows left sided pleural effusion with possible pericardial fluid.  Consult to PCCM for both thoracentesis of left pleural fluid and central line placement .  Unable to perform thoracentesis and IR unable to do this as well. Consult to both PCCM and cards, greatly appreciate recs   3) Repeat CBC daily, continue pulse ox and O2 as needed (3-4 L last night)   4) Will get PICC line (holding off previously due to possible fistula placement in future for CKD) and CTA today to evaluate for PE since continues to be hypoxic and tachycardic and may need further intervention depending on results.  LE Dopplers + for L sided DVT and started on 15 mg BID of Xarelto, monitor creatinine carefully   5) 2D ECHO with minimal amont of posterior pericardial fluid , ANA negative, RF slightly positive, do not believe this is cause of her pericardial fluid/pleuritis but continue to follow.  ESR elevated to 85.    2. Hyperlipidemia - Continue Pravachol   3. DM II - Continue SSI.  Will  stop Glipizide on D/C on d/c   4. Depression - Continue Wellbutrin   5. HTN - Continue home meds  6. AKI on CKD Stage III - Creatinine stable around 1.3, baseline   1) Recheck BMP daily   FEN/GI:  Heart Healthy, NO peripheral access, but will try for PICC today,  Central Line in place Prophylaxis:  Tx dose Xarelto  Disposition: Pending clinical improvement  Code Status: Full   Bryan R. Hess, DO of [facility] 11/07/2012, 8:08 AM

## 2012-11-07 NOTE — Progress Notes (Signed)
PHARMACIST - PHYSICIAN COMMUNICATION DR:  Family Medicine Rounding Team CONCERNING: Antibiotic IV to Oral Route Change Policy  RECOMMENDATION: This patient is receiving Levaquin by the intravenous route.  Based on criteria approved by the Pharmacy and Therapeutics Committee, the antibiotic(s) is/are being converted to the equivalent oral dose form(s).  DESCRIPTION: These criteria include:  Patient being treated for a respiratory tract infection, urinary tract infection, or cellulitis  The patient is not neutropenic and does not exhibit a GI malabsorption state  The patient is eating (either orally or via tube) and/or has been taking other orally administered medications for a least 24 hours  The patient is improving clinically and has a Tmax < 100.5  If you have questions about this conversion, please contact the Pharmacy Department  []   [phone] )  [facility] [x]   [phone] )  [facility]  []   [phone] )  [facility] []   [phone] )  [facility]   Georgina Pillion, PharmD, BCPS 11/07/2012 8:55 AM

## 2012-11-08 DIAGNOSIS — I82409 Acute embolism and thrombosis of unspecified deep veins of unspecified lower extremity: Secondary | ICD-10-CM

## 2012-11-08 LAB — CBC WITH DIFFERENTIAL/PLATELET
HCT: 30.4 % — ABNORMAL LOW (ref 36.0–46.0)
Hemoglobin: 10.5 g/dL — ABNORMAL LOW (ref 12.0–15.0)
Lymphocytes Relative: 11 % — ABNORMAL LOW (ref 12–46)
Lymphs Abs: 1.4 10*3/uL (ref 0.7–4.0)
Monocytes Absolute: 0.9 10*3/uL (ref 0.1–1.0)
Monocytes Relative: 7 % (ref 3–12)
Neutro Abs: 10.6 10*3/uL — ABNORMAL HIGH (ref 1.7–7.7)
Neutrophils Relative %: 81 % — ABNORMAL HIGH (ref 43–77)
RBC: 3.92 MIL/uL (ref 3.87–5.11)
WBC: 13.2 10*3/uL — ABNORMAL HIGH (ref 4.0–10.5)

## 2012-11-08 LAB — BASIC METABOLIC PANEL
BUN: 15 mg/dL (ref 6–23)
CO2: 24 mEq/L (ref 19–32)
Chloride: 100 mEq/L (ref 96–112)
Creatinine, Ser: 1.02 mg/dL (ref 0.50–1.10)
Potassium: 3.3 mEq/L — ABNORMAL LOW (ref 3.5–5.1)

## 2012-11-08 LAB — APTT
aPTT: 47 seconds — ABNORMAL HIGH (ref 24–37)
aPTT: 73 seconds — ABNORMAL HIGH (ref 24–37)

## 2012-11-08 LAB — PRO B NATRIURETIC PEPTIDE: Pro B Natriuretic peptide (BNP): 1205 pg/mL — ABNORMAL HIGH (ref 0–125)

## 2012-11-08 LAB — HEPARIN LEVEL (UNFRACTIONATED)
Heparin Unfractionated: 1.88 IU/mL — ABNORMAL HIGH (ref 0.30–0.70)
Heparin Unfractionated: 0.73 IU/mL — ABNORMAL HIGH (ref 0.30–0.70)

## 2012-11-08 LAB — HEPATIC FUNCTION PANEL
AST: 23 U/L (ref 0–37)
Albumin: 2.4 g/dL — ABNORMAL LOW (ref 3.5–5.2)
Total Protein: 7.4 g/dL (ref 6.0–8.3)

## 2012-11-08 LAB — PROCALCITONIN: Procalcitonin: 0.23 ng/mL

## 2012-11-08 LAB — GLUCOSE, CAPILLARY: Glucose-Capillary: 160 mg/dL — ABNORMAL HIGH (ref 70–99)

## 2012-11-08 MED ORDER — VANCOMYCIN HCL IN DEXTROSE 1-5 GM/200ML-% IV SOLN
1000.0000 mg | INTRAVENOUS | Status: AC
  Administered 2012-11-08: 1000 mg via INTRAVENOUS
  Filled 2012-11-08: qty 200

## 2012-11-08 MED ORDER — FUROSEMIDE 40 MG PO TABS
40.0000 mg | ORAL_TABLET | Freq: Two times a day (BID) | ORAL | Status: AC
  Administered 2012-11-08 – 2012-11-09 (×2): 40 mg via ORAL
  Filled 2012-11-08 (×2): qty 1

## 2012-11-08 MED ORDER — DEXTROSE 5 % IV SOLN
1.0000 g | Freq: Three times a day (TID) | INTRAVENOUS | Status: DC
  Administered 2012-11-08 – 2012-11-11 (×10): 1 g via INTRAVENOUS
  Filled 2012-11-08 (×13): qty 1

## 2012-11-08 MED ORDER — HEPARIN (PORCINE) IN NACL 100-0.45 UNIT/ML-% IJ SOLN
1200.0000 [IU]/h | INTRAMUSCULAR | Status: DC
  Administered 2012-11-08: 1050 [IU]/h via INTRAVENOUS
  Administered 2012-11-10: 1100 [IU]/h via INTRAVENOUS
  Administered 2012-11-11: 1200 [IU]/h via INTRAVENOUS
  Filled 2012-11-08 (×5): qty 250

## 2012-11-08 MED ORDER — POTASSIUM CHLORIDE CRYS ER 20 MEQ PO TBCR
30.0000 meq | EXTENDED_RELEASE_TABLET | Freq: Three times a day (TID) | ORAL | Status: AC
  Administered 2012-11-08 (×3): 30 meq via ORAL
  Filled 2012-11-08 (×3): qty 1

## 2012-11-08 MED ORDER — DEXTROSE 5 % IV SOLN
1.0000 g | INTRAVENOUS | Status: AC
  Administered 2012-11-08: 1 g via INTRAVENOUS
  Filled 2012-11-08: qty 1

## 2012-11-08 MED ORDER — VANCOMYCIN HCL IN DEXTROSE 1-5 GM/200ML-% IV SOLN
1000.0000 mg | Freq: Two times a day (BID) | INTRAVENOUS | Status: DC
  Administered 2012-11-08 – 2012-11-11 (×7): 1000 mg via INTRAVENOUS
  Filled 2012-11-08 (×9): qty 200

## 2012-11-08 MED ORDER — ISOSORB DINITRATE-HYDRALAZINE 20-37.5 MG PO TABS
1.0000 | ORAL_TABLET | Freq: Three times a day (TID) | ORAL | Status: DC
  Administered 2012-11-08 – 2012-11-13 (×14): 1 via ORAL
  Filled 2012-11-08 (×17): qty 1

## 2012-11-08 NOTE — Progress Notes (Signed)
PGY-1 Daily Progress Note Family Medicine Teaching Service [place] R. Hess, DO Service Pager: [phone]   Subjective: Pt feeling about the same as yesterday, with some mild shortness of breath   Objective:  VITALS Temp:  [98.1 F (36.7 C)-98.8 F (37.1 C)] 98.3 F (36.8 C) (03/14 0530) Pulse Rate:  [95-105] 105 (03/14 0844) Resp:  [19-21] 20 (03/14 0844) BP: (152-177)/(105-121) 152/105 mmHg (03/14 0844) SpO2:  [92 %-97 %] 95 % (03/14 0844)  In/Out  Intake/Output Summary (Last 24 hours) at 11/08/12 0923 Last data filed at 11/08/12 0800  Gross per 24 hour  Intake    240 ml  Output      0 ml  Net    240 ml    Physical Exam: Gen: mild distress, in bed on [place] 3.0 L HEENT: NCAT, MMM, PERRLA  CV: Tachycardic, no murmur appreciate, regular rhythm  Lungs: tachypnea, decreased breath sounds throughout, no wheezing or crackles today  Abd: soft/nontender/nondistended/normal bowel sounds  MSK: moves all extremities, trace edmea, no calf tenderness B/L  Skin: warm and dry, no rash  Neuro: grossly normal   MEDS Scheduled Meds: . amLODipine  5 mg Oral Daily  . antiseptic oral rinse  15 mL Mouth Rinse BID  . aspirin EC  81 mg Oral Daily  . buPROPion  50 mg Oral Daily  . insulin aspart  0-5 Units Subcutaneous QHS  . insulin aspart  0-9 Units Subcutaneous TID WC  . levofloxacin  750 mg Oral Daily  . nebivolol  5 mg Oral Daily  . polyethylene glycol  17 g Oral Daily  . rivaroxaban  15 mg Oral BID WC  . simvastatin  10 mg Oral q1800  . sodium chloride  10-40 mL Intracatheter Q12H  . sodium chloride  3 mL Intravenous Q12H   Continuous Infusions:  PRN Meds:.hydrALAZINE, morphine injection, ondansetron (ZOFRAN) IV, ondansetron, oxyCODONE-acetaminophen, sodium chloride  Labs and imaging:   CBC  Recent Labs Lab 11/06/12 0500 11/07/12 0600 11/08/12 0550  WBC 13.6* 15.4* 13.2*  HGB 10.1* 10.4* 10.5*  HCT 29.5* 30.6* 30.4*  PLT 335 333 390   BMET/CMET  Recent Labs Lab  11/06/12 0500 11/07/12 0600 11/08/12 0550  NA 134* 136 137  K 4.0 4.8 3.3*  CL 99 100 100  CO2 25 24 24   BUN 17 20 15   CREATININE 1.25* 1.32* 1.02  CALCIUM 9.3 9.5 9.3  GLUCOSE 166* 188* 184*   Results for orders placed during the hospital encounter of 11/03/12 (from the past 24 hour(s))  GLUCOSE, CAPILLARY     Status: Abnormal   Collection Time    11/07/12 12:58 PM      Result Value Range   Glucose-Capillary 204 (*) 70 - 99 mg/dL  GLUCOSE, CAPILLARY     Status: Abnormal   Collection Time    11/07/12  4:47 PM      Result Value Range   Glucose-Capillary 179 (*) 70 - 99 mg/dL  GLUCOSE, CAPILLARY     Status: Abnormal   Collection Time    11/07/12  9:53 PM      Result Value Range   Glucose-Capillary 126 (*) 70 - 99 mg/dL  CBC WITH DIFFERENTIAL     Status: Abnormal   Collection Time    11/08/12  5:50 AM      Result Value Range   WBC 13.2 (*) 4.0 - 10.5 K/uL   RBC 3.92  3.87 - 5.11 MIL/uL   Hemoglobin 10.5 (*) 12.0 - 15.0 g/dL  HCT 30.4 (*) 36.0 - 46.0 %   MCV 77.6 (*) 78.0 - 100.0 fL   MCH 26.8  26.0 - 34.0 pg   MCHC 34.5  30.0 - 36.0 g/dL   RDW 78.2  95.6 - 21.3 %   Platelets 390  150 - 400 K/uL   Neutrophils Relative 81 (*) 43 - 77 %   Neutro Abs 10.6 (*) 1.7 - 7.7 K/uL   Lymphocytes Relative 11 (*) 12 - 46 %   Lymphs Abs 1.4  0.7 - 4.0 K/uL   Monocytes Relative 7  3 - 12 %   Monocytes Absolute 0.9  0.1 - 1.0 K/uL   Eosinophils Relative 2  0 - 5 %   Eosinophils Absolute 0.3  0.0 - 0.7 K/uL   Basophils Relative 0  0 - 1 %   Basophils Absolute 0.0  0.0 - 0.1 K/uL  BASIC METABOLIC PANEL     Status: Abnormal   Collection Time    11/08/12  5:50 AM      Result Value Range   Sodium 137  135 - 145 mEq/L   Potassium 3.3 (*) 3.5 - 5.1 mEq/L   Chloride 100  96 - 112 mEq/L   CO2 24  19 - 32 mEq/L   Glucose, Bld 184 (*) 70 - 99 mg/dL   BUN 15  6 - 23 mg/dL   Creatinine, Ser 0.86  0.50 - 1.10 mg/dL   Calcium 9.3  8.4 - 57.8 mg/dL   GFR calc non Af Amer 62 (*) >90  mL/min   GFR calc Af Amer 72 (*) >90 mL/min  PROCALCITONIN     Status: None   Collection Time    11/08/12  5:50 AM      Result Value Range   Procalcitonin 0.23     Ct Angio Chest Pe W/cm &/or Wo Cm  11/08/2012  *RADIOLOGY REPORT*  Clinical Data: Shortness of breath, question pulmonary embolism, history hypertension, diabetes  CT ANGIOGRAPHY CHEST  Technique:  Multidetector CT imaging of the chest using the standard protocol during bolus administration of intravenous contrast. Multiplanar reconstructed images including MIPs were obtained and reviewed to evaluate the vascular anatomy.  Contrast: OMNIPAQUE IOHEXOL 350 MG/ML SOLN  Comparison: CT chest 11/04/2012  Findings: Aorta normal caliber without aneurysm or dissection. Small moderate pericardial effusion present. Bilateral pleural effusions left greater than right. Filling defects are identified within right lung pulmonary arterial branches consistent with pulmonary embolism.  Complete left lower lobe and partial right lower lobe atelectasis. Partial atelectasis of left upper lobe/lingula also present. No pulmonary infiltrate or pleural effusion. Visualized portions of liver and spleen unremarkable. No acute osseous findings.  IMPRESSION: Filling defects within right upper and middle lobe pulmonary arterial branches consistent with pulmonary embolism. Bilateral pleural effusions and lower lobe atelectasis greater on the left. Moderate pericardial effusion.  Critical Value/emergent results were called by telephone at the time of interpretation on 11/08/2012 at 0047 to [facility] on [address], who verbally acknowledged these results.   Original Report Authenticated By: Ulyses Southward, M.D.     ECHO 11/06/12 Study Conclusions  - Left ventricle: The cavity size was normal. There was mild concentric hypertrophy. Systolic function was normal. The estimated ejection fraction was in the range of 60% to 65%. Wall motion was normal; there were no regional  wall motion abnormalities. Doppler parameters are consistent with abnormal left ventricular relaxation (grade 1 diastolic dysfunction). - Pericardium, extracardiac: A trivial pericardial effusion was identified posterior to  the heart. There was a large left pleural effusion.   Assessment  Pt is a 52 y.o. patient with significant PMHx for Depression, HTN admitted with chest pain and shortness of breath    Plan:   1. Chest Pain secondary to HCAP vs PE on CTA vs pericarditis secondary to possible rheumatologic condition vs Grade One Diastolic CHF - Cath done end of February 2014 without evidence of CAD  1) WBC between 13-16 since admission.  Day 4 of Levaquin, switched from Rocephin   2) CT chest w/o contrast shows left sided pleural effusion with possible pericardial fluid.  Consult to PCCM for both thoracentesis of left pleural fluid and central line placement .  Unable to perform thoracentesis and IR unable to do this as well. Consult to both PCCM and cards, greatly appreciate recs   3) Repeat CBC daily, continue pulse ox and O2 as needed (3-4 L last night)   4) CTA shows RUL and RLL PE, continues to be hypoxic on 3 L and tachycardic.  Pleural Effusion on L side may be enlarging.   LE Dopplers + for L sided DVT and after discussion with pulmonology, will stop Xarelto, switch to IV heparin, get LFTs and Hepatitis panel evaluate for cirrhosis and consider lasix for possible fluid overload w/ grade one diastolic CHF.  Will also add back on Vanc and Cefepime for possible HCAP as WBC still elevated  5) 2D ECHO with minimal amont of posterior pericardial fluid , ANA negative, RF slightly positive, do not believe this is cause of her pericardial fluid/pleuritis but continue to follow.  ESR elevated to 85.    2. Hyperlipidemia - Continue Pravachol   3. DM II - Continue SSI.  Will stop Glipizide on D/C on d/c   4. Depression - Continue Wellbutrin   5. HTN - Continue home meds  6. AKI on CKD Stage  III - Creatinine decreased to 1.02 today, baseline   1) Recheck BMP daily   FEN/GI:  Heart Healthy, NO peripheral access, but will try for PICC today,  Central Line in place Prophylaxis:  Will switch to IV heparin today per pulm Disposition: Pending clinical improvement  Code Status: Full   Bryan R. Paulina Fusi, DO of [facility] 11/08/2012, 8:23 AM

## 2012-11-08 NOTE — Progress Notes (Signed)
FMTS Attending Note Patient seen and examined by me, discussed with resident team and I agree with Dr Paulina Fusi' assessment and plan.  Patient appears clinically unchanged, continued oxygen requirement and chest pain.  Right-sided pulmonary emboli are noted. A/P: Patient with chest pain (R and L-sided), bilateral pleural effusions (L>R) and R-sided PE.  Recent admission for chest pain, during which time the patient had a cardiac cath that was negative.  Concern for possible development of Hospital-acquired PNA during that stay; agree with broadening of antibiotic coverage.  Will switch to iv heparin gtt for now as well. Agree with gentle diuresis and close clinical monitoring.  Appreciate CCM's input into this difficult case.  [name], MD

## 2012-11-08 NOTE — Progress Notes (Addendum)
ANTIBIOTIC - FOLLOW UP & ANTICOAGULATION - INITIAL Consult Note  Pharmacy Consult for Levaquin + Cefepime + Vancomycin & Heparin (transitioning from Xarelto) Indication: Broadened HCAP coverage & Acute B/L PE + DVT  No Known Allergies  Patient Measurements: Height: 5\' 7"  (170.2 cm) Weight: 184 lb 1.4 oz (83.5 kg) IBW/kg (Calculated) : 61.6  Vital Signs: Temp: 98.3 F (36.8 C) (03/14 0530) Temp src: Oral (03/14 0530) BP: 173/118 mmHg (03/14 0530) Pulse Rate: 95 (03/14 0530) Intake/Output from previous day:   Intake/Output from this shift:    Labs:  Recent Labs  11/06/12 0500 11/07/12 0600 11/08/12 0550  WBC 13.6* 15.4* 13.2*  HGB 10.1* 10.4* 10.5*  PLT 335 333 390  CREATININE 1.25* 1.32* 1.02   Estimated Creatinine Clearance: 71.7 ml/min (by C-G formula based on Cr of 1.02). No results found for this basename: VANCOTROUGH, VANCOPEAK, VANCORANDOM, GENTTROUGH, GENTPEAK, GENTRANDOM, TOBRATROUGH, TOBRAPEAK, TOBRARND, AMIKACINPEAK, AMIKACINTROU, AMIKACIN,  in the last 72 hours   Microbiology: No results found for this or any previous visit (from the past 720 hour(s)).  Anti-infectives   Start     Dose/Rate Route Frequency Ordered Stop   11/07/12 1200  levofloxacin (LEVAQUIN) tablet 750 mg     750 mg Oral Daily 11/07/12 0858     11/05/12 1400  levofloxacin (LEVAQUIN) IVPB 750 mg  Status:  Discontinued     750 mg 100 mL/hr over 90 Minutes Intravenous Every 24 hours 11/05/12 1313 11/07/12 0857   11/05/12 0600  cefTRIAXone (ROCEPHIN) injection 1 g  Status:  Discontinued     1 g Intramuscular Every 24 hours 11/04/12 1512 11/05/12 1054   11/04/12 0445  cefTRIAXone (ROCEPHIN) 1 g in dextrose 5 % 50 mL IVPB  Status:  Discontinued     1 g 100 mL/hr over 30 Minutes Intravenous Every 24 hours 11/04/12 0436 11/04/12 1512   11/04/12 0215  cefTRIAXone (ROCEPHIN) injection 1 g  Status:  Discontinued     1 g Intramuscular Every 24 hours 11/04/12 0211 11/04/12 0436       Assessment: 52 y.o. F who continues on Levaquin D#4 for empiric HCAP coverage (recent admission in Feb '14) and is now to add Cefepime + Vancomycin for broadened HCAP coverage. CT Angio on 3/14 showed B/L pleural effusions and lower lobe atelectasis and confirmed B/L pulmonary emboli. The patient is afebrile, WBC is trending down, no cultures have been taken this admission. Renal function has been improving, SCr 1.02 << 1.32, CrCl~70-75 ml/min. The patient's Levaquin was changed to po on 3/13 based on our P&T criteria. Will start Vanc/Cefepime today.  The patient was started on Xarelto on 3/12 for treatment of LLE DVT and was found on CT angio on 3/13 to have B/L PE. Pharmacy has been consulted to transition to heparin for 48 hours in the setting of acute PE/DVT. Since the patient has been on Xarelto will obtain baseline HL and aPTT since Xarelto has been know to effect these. Will plan to start heparin after these labs results.  Goal of Therapy:  Proper antibiotics for infection/cultures adjusted for renal/hepatic function   Plan:  1. Start Vancomycin 1g IV every 12 hours 2. Start Cefepime 1g IV every 12 hours 3. Continue Levaquin 750 mg po every 24 hours 4. Obtain stat aPTT and heparin level 5. Will plan to start heparin once baseline labs obtained  Georgina Pillion, PharmD, BCPS Clinical Pharmacist Pager: [phone] 11/08/2012 8:37 AM    ----------------------------------------------------------------------------------- Heparin Addendum:  Baseline labs revealed heparin level of  1.88 and aPTT of 47. Heparin level assay known to be effected by Xarelto, and aPTT also to a smaller extent -- however this is only slight elevated and remains below goal range for treatment of PE (aPTT goal range of 66-102). Will go ahead and start heparin this morning without a bolus and continue checking both aPTT and heparin levels for now until effects of Xarelto have diminished. IBW: 61.6 kg, Hep Wt:79 kg.    Goal of Therapy:  aPTT level of 66-102 sec HL 0.3-0.7 units/ml (once effects of Xarelto have diminished)  Plan 1. Start heparin at a drip rate of 1050 units/hr (10.5 ml/hr) 2. Daily heparin levels and aPTT for now 3. Will continue to monitor for any signs/symptoms of bleeding and will follow up with heparin level and aPTT in 6 hours   Georgina Pillion, PharmD, BCPS Clinical Pharmacist Pager: [phone] 11/08/2012 11:13 AM

## 2012-11-08 NOTE — Progress Notes (Signed)
Pt has heparin going through CVC line, unable to draw a heparin level, this was explained to pt and Scientist, research (physical sciences). The nurse will have lab drawn the heparin level due now.  Advised to hook the heparin drip to the PIV in the right AC.  Consuello Masse

## 2012-11-08 NOTE — Progress Notes (Signed)
CRITICAL VALUE ALERT  Critical value received:  Right Upper and Middle Lobe PE (per Dr Pia Mau in Radiology)  Date of notification:  11/08/12  Time of notification:  0045  Critical value read back:yes  Nurse who received alert:  Kerby Moors  MD notified (1st page):  FMTS  Time of first page:  0050  MD notified (2nd page): n/a  Time of second page: n/a  Responding MD:  Dr Mikel Cella  Time MD responded:  [phone]

## 2012-11-08 NOTE — Progress Notes (Addendum)
Name: Cynthia Cameron DOB: 04/23/1961 MRN: [id number] PCP: Abbe Amsterdam, MD ADMIT DATE: 11/03/2012 LOS: 5 Consult requeste by Dr Mauricio Po of [facility] PCCM CONSULT NOTE  HPI Cynthia Cameron is a 52 y.o. year old female presenting with chest pain, SOB and cough.  Chest pain and SOB now present for 2 weeks. Seen and admitted by cardiology on 2/27 due to EKG with nonspecific EKG changes. CXR before d/c showed likely atelectasis at lung bases. Patient was discharged after she had a negative heart catheterization. Since that time, she has developed a cough which has worsened her chest pain and changed its position. Previously "ton of bricks over her right chest" now spread to her left chest as well. Patient denies fever/chills but endorses cough productive of yellow sputum. Cough has exacerbated chest pain. SOB and chest pain limit her from walking to her mail box. Patient is overall exhauseted as well. Chest pain is also pleuritic and severely painful 10/10. Exertional stress worsens pain but not relieved by rest. No change in pain since catheterization.  Patient has had poor PO as she has lost power and has had low appetite due to her chset pain. She noted a CBG of 29 at home this morning after she felt that she was overall weak. CBG up to 140 after 3 spoons of syrup after 15 minutes. Within the hour, CBG 85 again.  Patient seen at [facility] 11/04/12  and was noted to have a LLL pneumonia and given poorly controlled CBGs and persistent chest pain was sent to ED with plan for admission. Already received ceftriaxone at Urgent Care  PCCM attemtpted thoracentesis elft side but was dry tap on 11/05/12. ON 11/06/12 due to persistent symptoms consult called   Micro:  Recent Labs Lab 11/06/12 1240  LATICACIDVEN 1.0   No results found for this or any previous visit (from the past 240 hour(s)).  Antibiotics: Anti-infectives   Start     Dose/Rate Route Frequency Ordered Stop   11/08/12 2300   vancomycin (VANCOCIN) IVPB 1000 mg/200 mL premix     1,000 mg 200 mL/hr over 60 Minutes Intravenous Every 12 hours 11/08/12 0941     11/08/12 2000  ceFEPIme (MAXIPIME) 1 g in dextrose 5 % 50 mL IVPB     1 g 100 mL/hr over 30 Minutes Intravenous Every 8 hours 11/08/12 0941     11/08/12 1030  vancomycin (VANCOCIN) IVPB 1000 mg/200 mL premix     1,000 mg 200 mL/hr over 60 Minutes Intravenous STAT 11/08/12 0939 11/09/12 1030   11/08/12 1030  ceFEPIme (MAXIPIME) 1 g in dextrose 5 % 50 mL IVPB     1 g 100 mL/hr over 30 Minutes Intravenous STAT 11/08/12 0939 11/09/12 1030   11/07/12 1200  levofloxacin (LEVAQUIN) tablet 750 mg     750 mg Oral Daily 11/07/12 0858     11/05/12 1400  levofloxacin (LEVAQUIN) IVPB 750 mg  Status:  Discontinued     750 mg 100 mL/hr over 90 Minutes Intravenous Every 24 hours 11/05/12 1313 11/07/12 0857   11/05/12 0600  cefTRIAXone (ROCEPHIN) injection 1 g  Status:  Discontinued     1 g Intramuscular Every 24 hours 11/04/12 1512 11/05/12 1054   11/04/12 0445  cefTRIAXone (ROCEPHIN) 1 g in dextrose 5 % 50 mL IVPB  Status:  Discontinued     1 g 100 mL/hr over 30 Minutes Intravenous Every 24 hours 11/04/12 0436 11/04/12 1512   11/04/12 0215  cefTRIAXone (ROCEPHIN) injection 1 g  Status:  Discontinued     1 g Intramuscular Every 24 hours 11/04/12 0211 11/04/12 0436       EVNTS 11/07/12: Autoimmune negative. Doppler positive for DVT. [facility]PTS started patient on xarelto. Pharmacy says current renal function ok to do xareltp. She feels some better   SUBJECTIVE/OVERNIGHT/INTERVAL HX 11/08/12: Asked by [facility] to see her again. She is no better. She is on xarelto. CTA shows RT sided PE with rt effusion but she also has left sided effusion with atx but no PE at that site.   Vital Signs: Temp:  [98.1 F (36.7 C)-98.8 F (37.1 C)] 98.3 F (36.8 C) (03/14 0530) Pulse Rate:  [95-105] 105 (03/14 0844) Resp:  [19-21] 20 (03/14 0844) BP: (152-177)/(105-121) 152/105 mmHg (03/14  0844) SpO2:  [92 %-97 %] 95 % (03/14 0844)    Physical Examination: General: looks unweell due to pain. Sitting erect. Says she is not much better Neuro:  GCS 15. RASS 0. Oriented x 3. CAm-ICU negative for delirium HEENT:  Supple neck, Neck:    PEERL + Cardiovascular:  Sinus Chest:Normal chest contour Lungs:  COarse, low volume ventilation Abdomen:  Soft, non tender Musculoskeletal:  ? Mild edmea Skin:  intact Extremities:no msk issues  /Radiology: x 48h  Ct Angio Chest Pe W/cm &/or Wo Cm  11/08/2012  *RADIOLOGY REPORT*  Clinical Data: Shortness of breath, question pulmonary embolism, history hypertension, diabetes  CT ANGIOGRAPHY CHEST  Technique:  Multidetector CT imaging of the chest using the standard protocol during bolus administration of intravenous contrast. Multiplanar reconstructed images including MIPs were obtained and reviewed to evaluate the vascular anatomy.  Contrast: OMNIPAQUE IOHEXOL 350 MG/ML SOLN  Comparison: CT chest 11/04/2012  Findings: Aorta normal caliber without aneurysm or dissection. Small moderate pericardial effusion present. Bilateral pleural effusions left greater than right. Filling defects are identified within right lung pulmonary arterial branches consistent with pulmonary embolism.  Complete left lower lobe and partial right lower lobe atelectasis. Partial atelectasis of left upper lobe/lingula also present. No pulmonary infiltrate or pleural effusion. Visualized portions of liver and spleen unremarkable. No acute osseous findings.  IMPRESSION: Filling defects within right upper and middle lobe pulmonary arterial branches consistent with pulmonary embolism. Bilateral pleural effusions and lower lobe atelectasis greater on the left. Moderate pericardial effusion.  Critical Value/emergent results were called by telephone at the time of interpretation on 11/08/2012 at 0047 to [facility] on [address], who verbally acknowledged these results.   Original Report  Authenticated By: Ulyses Southward, M.D.    LABS -  Recent Labs Lab 11/06/12 0500 11/07/12 0600 11/08/12 0550  HGB 10.1* 10.4* 10.5*  HCT 29.5* 30.6* 30.4*  WBC 13.6* 15.4* 13.2*  PLT 335 333 390    Recent Labs Lab 11/04/12 0432 11/05/12 0951 11/06/12 0500 11/07/12 0600 11/08/12 0550  NA 133* 133* 134* 136 137  K 4.4 4.2 4.0 4.8 3.3*  CL 96 96 99 100 100  CO2 25 26 25 24 24   GLUCOSE 197* 205* 166* 188* 184*  BUN 27* 19 17 20 15   CREATININE 1.75* 1.45* 1.25* 1.32* 1.02  CALCIUM 9.8 9.5 9.3 9.5 9.3      Recent Labs Lab 11/03/12 2305  TROPONINI <0.30    Recent Labs Lab 11/04/12 0432  PROBNP 319.2*    Recent Labs Lab 11/06/12 1240 11/08/12 0550  LATICACIDVEN 1.0  --   PROCALCITON 0.16 0.23     ASSESSMENT AND PLAN  RESPIRATORY  Recent Labs Lab 11/06/12 1530  PHART 7.381  PCO2ART 40.0  PO2ART 54.2*  HCO3 23.2  O2SAT 87.4   A:   - Dyspnea - multifactorial due to effusion and pericardial effusion - Small left pleural effusion, Dry tap by pulmonary 11/05/12.  ? Due to HCAP and parapneumni - RLL PE (DVT +)  with RLL effusion  -  Autoimmune neg  - 11/08/12: No better  P: - DC xarelto.  - Change to IV heparin (70% of patients in EINSTEIN study were loaded with lovenox/heparin for 48h prior to xarelto). Also, gives ease of converstion -anticoagulation per FPTS; duration 6-48 months depending on etiology and course - expand abx coverage - diuresis - if not better, consider repeat ECHP - watch closely  - PCCM will be avaialbe as needed    CARDIAC  Recent Labs Lab 11/03/12 2305 11/04/12 0432  TROPONINI <0.30  --   PROBNP  --  319.2*   A: small pericardial effusion per ECHO but moderate per CT P: ? Repeat echo  D/w Dr Mauricio Po    Dr. Kalman Shan, M.D., [facility].C.P Pulmonary and Critical Care Medicine Staff Physician [facility] [place] [facility] Pager: [phone], If no answer or between  15:00h - 7:00h:  call [phone]  11/08/2012 11:25 AM

## 2012-11-08 NOTE — Progress Notes (Signed)
ANTICOAGULATION CONSULT NOTE - Follow Up Consult  Pharmacy Consult for Heparin Indication: pulmonary embolus  No Known Allergies  Patient Measurements: Height: 5\' 7"  (170.2 cm) Weight: 184 lb 1.4 oz (83.5 kg) IBW/kg (Calculated) : 61.6 Heparin Dosing Weight: 79 kg  Vital Signs: Temp: 98 F (36.7 C) (03/14 1635) Temp src: Oral (03/14 1635) BP: 175/117 mmHg (03/14 1635) Pulse Rate: 102 (03/14 1635)  Labs:  Recent Labs  11/06/12 0500 11/07/12 0600 11/08/12 0550 11/08/12 1009 11/08/12 1904  HGB 10.1* 10.4* 10.5*  --   --   HCT 29.5* 30.6* 30.4*  --   --   PLT 335 333 390  --   --   APTT  --   --   --  47* 73*  HEPARINUNFRC  --   --   --  1.88* 0.73*  CREATININE 1.25* 1.32* 1.02  --   --     Estimated Creatinine Clearance: 71.7 ml/min (by C-G formula based on Cr of 1.02).   Assessment: 51 YOF on heparin (transitioned from xarelto) for acute SVT and B/L PE. Heparin was started this afternoon, xarelto last dose was yesterday at 1700. 6 hr heparin level = 0.73 (xarelto might still have effect on heparin level test), aPTT is at goal. No bleeding, no interruptions with infusion per RN.  Goal of Therapy:  APTT level = 66-102 second Heparin level 0.3-0.7 units/ml Monitor platelets by anticoagulation protocol: Yes   Plan:  - Continue heparin infusion at current rate - check confirmatory heparin level at 0100   Bayard Hugger, PharmD, BCPS  Clinical Pharmacist  Pager: [phone]   11/08/2012,7:39 PM

## 2012-11-08 NOTE — Progress Notes (Signed)
  Echocardiogram 2D Echocardiogram limited has been performed.  Cynthia Cameron, Cynthia Cameron 11/08/2012, 3:17 PM

## 2012-11-08 NOTE — Progress Notes (Signed)
Subjective:  Shortness of breath continues. Echocardiogram shows good LV systolic function and trivial pericardial effusion but large pleural effusion. CT Angio chest +ve for PE.  Objective:  Vital Signs in the last 24 hours: Temp:  [97.4 F (36.3 C)-98.8 F (37.1 C)] 98 F (36.7 C) (03/14 1635) Pulse Rate:  [95-105] 102 (03/14 1635) Cardiac Rhythm:  [-] Sinus tachycardia (03/14 1045) Resp:  [19-21] 20 (03/14 1635) BP: (152-177)/(103-118) 175/117 mmHg (03/14 1635) SpO2:  [93 %-97 %] 93 % (03/14 1635)  Physical Exam: BP Readings from Last 1 Encounters:  11/08/12 175/117     Wt Readings from Last 1 Encounters:  11/06/12 83.5 kg (184 lb 1.4 oz)    Weight change:   HEENT: /AT, Eyes-Brown, PERL, EOMI, Conjunctiva-Pink, Sclera-Non-icteric Neck: No JVD, No bruit, Trachea midline. Lungs: Decreased air entry at bases, Bilateral. Cardiac:  Regular rhythm, normal S1 and S2, no S3.  Abdomen:  Soft, non-tender. Extremities:  1 + edema of lower legs present. No cyanosis. No clubbing. CNS: AxOx3, Cranial nerves grossly intact, moves all 4 extremities. Right handed. Skin: Warm and dry.   Intake/Output from previous day:      Lab Results: BMET    Component Value Date/Time   NA 137 11/08/2012 0550   K 3.3* 11/08/2012 0550   CL 100 11/08/2012 0550   CO2 24 11/08/2012 0550   GLUCOSE 184* 11/08/2012 0550   BUN 15 11/08/2012 0550   CREATININE 1.02 11/08/2012 0550   CREATININE 1.50* 01/29/2012 1616   CREATININE 1.50* 01/29/2012 1616   CALCIUM 9.3 11/08/2012 0550   GFRNONAA 62* 11/08/2012 0550   GFRAA 72* 11/08/2012 0550   CBC    Component Value Date/Time   WBC 13.2* 11/08/2012 0550   WBC 12.9* 11/03/2012 1723   RBC 3.92 11/08/2012 0550   RBC 4.40 11/03/2012 1723   HGB 10.5* 11/08/2012 0550   HGB 11.6* 11/03/2012 1723   HCT 30.4* 11/08/2012 0550   HCT 36.8* 11/03/2012 1723   PLT 390 11/08/2012 0550   MCV 77.6* 11/08/2012 0550   MCV 83.6 11/03/2012 1723   MCH 26.8 11/08/2012 0550   MCH 26.4* 11/03/2012  1723   MCHC 34.5 11/08/2012 0550   MCHC 31.5* 11/03/2012 1723   RDW 14.6 11/08/2012 0550   LYMPHSABS 1.4 11/08/2012 0550   MONOABS 0.9 11/08/2012 0550   EOSABS 0.3 11/08/2012 0550   BASOSABS 0.0 11/08/2012 0550   CARDIAC ENZYMES Lab Results  Component Value Date   TROPONINI <0.30 11/03/2012    Scheduled Meds: . amLODipine  5 mg Oral Daily  . antiseptic oral rinse  15 mL Mouth Rinse BID  . aspirin EC  81 mg Oral Daily  . buPROPion  50 mg Oral Daily  . ceFEPime (MAXIPIME) IV  1 g Intravenous Q8H  . furosemide  40 mg Oral BID  . insulin aspart  0-5 Units Subcutaneous QHS  . insulin aspart  0-9 Units Subcutaneous TID WC  . levofloxacin  750 mg Oral Daily  . nebivolol  5 mg Oral Daily  . polyethylene glycol  17 g Oral Daily  . potassium chloride  30 mEq Oral TID  . simvastatin  10 mg Oral q1800  . sodium chloride  10-40 mL Intracatheter Q12H  . sodium chloride  3 mL Intravenous Q12H  . vancomycin  1,000 mg Intravenous Q12H   Continuous Infusions: . heparin 1,050 Units/hr (11/08/12 1314)   PRN Meds:.hydrALAZINE, morphine injection, ondansetron (ZOFRAN) IV, ondansetron, oxyCODONE-acetaminophen, sodium chloride  Assessment/Plan: PE DVT  Chest  pain  Bilateral pleural effusions, left more than right.  Trivial pericardial effusion  Possible pneumonia  Possible pulmonary embolism  Hypertension  CKD, II  Anxiety  Agree with heparin and lasix use.   LOS: 5 days    Orpah Cobb  MD  11/08/2012, 7:11 PM

## 2012-11-08 NOTE — Progress Notes (Signed)
PT Cancellation Note  Patient Details Name: Cynthia Cameron MRN: [id number] DOB: 1960-10-17   Cancelled Treatment:    Reason Eval/Treat Not Completed: Medical issues which prohibited therapy (pt continues with elevated systolic and diastolic BP )   Donnetta Hail 11/08/2012, 10:57 AM

## 2012-11-08 NOTE — Care Management Note (Signed)
CM has a card good for a 10 day free trial of Xarelto, this would require the MD to write a prescription for 10 days then a second prescription for ongoing Xarelto needs. Please contact CM if this is needed. Johny Shock RN MPH Case Manager [phone]

## 2012-11-09 LAB — BASIC METABOLIC PANEL
BUN: 15 mg/dL (ref 6–23)
GFR calc Af Amer: 66 mL/min — ABNORMAL LOW (ref >90)
GFR calc non Af Amer: 57 mL/min — ABNORMAL LOW (ref >90)
Potassium: 4 mEq/L (ref 3.5–5.1)

## 2012-11-09 LAB — CBC WITH DIFFERENTIAL/PLATELET
Basophils Absolute: 0 10*3/uL (ref 0.0–0.1)
Basophils Relative: 0 % (ref 0–1)
Eosinophils Absolute: 0.3 10*3/uL (ref 0.0–0.7)
MCH: 26.5 pg (ref 26.0–34.0)
MCHC: 34.4 g/dL (ref 30.0–36.0)
Monocytes Relative: 7 % (ref 3–12)
Neutrophils Relative %: 76 % (ref 43–77)
Platelets: 442 10*3/uL — ABNORMAL HIGH (ref 150–400)
RDW: 14.8 % (ref 11.5–15.5)

## 2012-11-09 LAB — GLUCOSE, CAPILLARY
Glucose-Capillary: 186 mg/dL — ABNORMAL HIGH (ref 70–99)
Glucose-Capillary: 136 mg/dL — ABNORMAL HIGH (ref 70–99)

## 2012-11-09 LAB — APTT: aPTT: 67 seconds — ABNORMAL HIGH (ref 24–37)

## 2012-11-09 MED ORDER — ALBUTEROL SULFATE HFA 108 (90 BASE) MCG/ACT IN AERS
1.0000 | INHALATION_SPRAY | RESPIRATORY_TRACT | Status: DC | PRN
  Administered 2012-11-09 – 2012-11-10 (×2): 2 via RESPIRATORY_TRACT
  Filled 2012-11-09 (×2): qty 6.7

## 2012-11-09 MED ORDER — DOCUSATE SODIUM 100 MG PO CAPS
100.0000 mg | ORAL_CAPSULE | Freq: Every day | ORAL | Status: DC | PRN
  Administered 2012-11-09 – 2012-11-10 (×2): 100 mg via ORAL
  Filled 2012-11-09 (×2): qty 1

## 2012-11-09 NOTE — Progress Notes (Signed)
Patient requesting something to help her breathing. RR 24, labored, SpO2 @ 94% on 3L [place]. Pt denies pain, sitting in chair. No other cx. MD paged at [phone], will continue to monitor.

## 2012-11-09 NOTE — Progress Notes (Signed)
ANTICOAGULATION CONSULT NOTE - Follow Up Consult  Pharmacy Consult for Heparin Indication: pulmonary embolus  No Known Allergies  Patient Measurements: Height: 5\' 4"  (162.6 cm) Weight: 182 lb 12.2 oz (82.9 kg) IBW/kg (Calculated) : 54.7 Heparin Dosing Weight: 79 kg  Vital Signs: Temp: 99 F (37.2 C) (03/15 1346) Temp src: Oral (03/15 1346) BP: 148/100 mmHg (03/15 1346) Pulse Rate: 109 (03/15 1346)  Labs:  Recent Labs  11/07/12 0600 11/08/12 0550  11/08/12 1009 11/08/12 1904 11/09/12 0611 11/09/12 1624  HGB 10.4* 10.5*  --   --   --  10.4*  --   HCT 30.6* 30.4*  --   --   --  30.2*  --   PLT 333 390  --   --   --  442*  --   APTT  --   --   --  47* 73* 67*  --   HEPARINUNFRC  --   --   < > 1.88* 0.73* 0.38 0.31  CREATININE 1.32* 1.02  --   --   --  1.09  --   < > = values in this interval not displayed.  Estimated Creatinine Clearance: 62.9 ml/min (by C-G formula based on Cr of 1.09).   Assessment: 51 YOF on heparin (transitioned from xarelto) for acute SVT and B/L PE. Heparin was started yesterday, xarelto last dose was 3/13 at 1700. aPTT at goal- 67. Heparin level seems to have normalized and coordinates with aPTT - 0.31.  Levels are therapeutic but at low end of goal.    Goal of Therapy:  APTT level = 66-102 second Heparin level 0.3-0.7 units/ml Monitor platelets by anticoagulation protocol: Yes   Plan:  - Continue Heparin at 1100 units/hr - heparin level and CBC in AM - F/u transition back to Xarelto on 3/16   Celedonio Miyamoto, PharmD, BCPS Clinical Pharmacist Pager [phone]   11/09/2012 4:56 PM

## 2012-11-09 NOTE — Progress Notes (Signed)
FMTS Attending Note Patient seen and examined by me, I have reviewed Dr Paulina Fusi' note and agree with his assessment and plan for today.  Ms. Siess reports feeling somewhat improved today, still short of breath but with less chest pain.   I agree with plan to continue antibiotic treatment for hospital-acquired PNA, anticoagulation with heparin for R sided PE; watch oxygen requirement in the hopes that this will improve as well.  Agree with diuresis and I/O measurement.   [name], MD

## 2012-11-09 NOTE — Progress Notes (Signed)
PGY-1 Daily Progress Note Family Medicine Teaching Service [place] R. Teven Mittman, DO Service Pager: [phone]   Subjective: Pt feeling about the same as yesterday, with some mild shortness of breath   Objective:  VITALS Temp:  [97.4 F (36.3 C)-98.1 F (36.7 C)] 98 F (36.7 C) (03/15 0517) Pulse Rate:  [95-102] 101 (03/15 0517) Resp:  [20-22] 22 (03/15 0517) BP: (147-175)/(97-117) 147/97 mmHg (03/15 0517) SpO2:  [93 %-97 %] 96 % (03/15 0517) Weight:  [182 lb 12.2 oz (82.9 kg)] 182 lb 12.2 oz (82.9 kg) (03/14 2119)  In/Out  Intake/Output Summary (Last 24 hours) at 11/09/12 0854 Last data filed at 11/09/12 0800  Gross per 24 hour  Intake 1987.63 ml  Output   4100 ml  Net -2112.37 ml    Physical Exam: Gen: mild distress, in bed on Section 3.0 L HEENT: NCAT, MMM, PERRLA  CV: Tachycardic, no murmur appreciate, regular rhythm  Lungs: tachypnea, decreased breath sounds throughout, no wheezing or crackles today  Abd: soft/nontender/nondistended/normal bowel sounds  MSK: moves all extremities, trace edmea, no calf tenderness B/L  Skin: warm and dry, no rash  Neuro: grossly normal   MEDS Scheduled Meds: . amLODipine  5 mg Oral Daily  . antiseptic oral rinse  15 mL Mouth Rinse BID  . aspirin EC  81 mg Oral Daily  . buPROPion  50 mg Oral Daily  . ceFEPime (MAXIPIME) IV  1 g Intravenous Q8H  . insulin aspart  0-5 Units Subcutaneous QHS  . insulin aspart  0-9 Units Subcutaneous TID WC  . isosorbide-hydrALAZINE  1 tablet Oral TID  . levofloxacin  750 mg Oral Daily  . nebivolol  5 mg Oral Daily  . polyethylene glycol  17 g Oral Daily  . simvastatin  10 mg Oral q1800  . sodium chloride  10-40 mL Intracatheter Q12H  . sodium chloride  3 mL Intravenous Q12H  . vancomycin  1,000 mg Intravenous Q12H   Continuous Infusions: . heparin 1,100 Units/hr (11/09/12 0851)   PRN Meds:.hydrALAZINE, morphine injection, ondansetron (ZOFRAN) IV, ondansetron, oxyCODONE-acetaminophen, sodium  chloride  Labs and imaging:   CBC  Recent Labs Lab 11/07/12 0600 11/08/12 0550 11/09/12 0611  WBC 15.4* 13.2* 10.4  HGB 10.4* 10.5* 10.4*  HCT 30.6* 30.4* 30.2*  PLT 333 390 442*   BMET/CMET  Recent Labs Lab 11/07/12 0600 11/08/12 0550 11/08/12 1009 11/09/12 0611  NA 136 137  --  134*  K 4.8 3.3*  --  4.0  CL 100 100  --  97  CO2 24 24  --  25  BUN 20 15  --  15  CREATININE 1.32* 1.02  --  1.09  CALCIUM 9.5 9.3  --  9.5  PROT  --   --  7.4  --   BILITOT  --   --  0.3  --   ALKPHOS  --   --  171*  --   ALT  --   --  30  --   AST  --   --  23  --   GLUCOSE 188* 184*  --  166*   Results for orders placed during the hospital encounter of 11/03/12 (from the past 24 hour(s))  HIV ANTIBODY (ROUTINE TESTING)     Status: None   Collection Time    11/08/12 10:09 AM      Result Value Range   HIV NON REACTIVE  NON REACTIVE  HEPATIC FUNCTION PANEL     Status: Abnormal   Collection Time  11/08/12 10:09 AM      Result Value Range   Total Protein 7.4  6.0 - 8.3 g/dL   Albumin 2.4 (*) 3.5 - 5.2 g/dL   AST 23  0 - 37 U/L   ALT 30  0 - 35 U/L   Alkaline Phosphatase 171 (*) 39 - 117 U/L   Total Bilirubin 0.3  0.3 - 1.2 mg/dL   Bilirubin, Direct <1.6  0.0 - 0.3 mg/dL   Indirect Bilirubin NOT CALCULATED  0.3 - 0.9 mg/dL  PRO B NATRIURETIC PEPTIDE     Status: Abnormal   Collection Time    11/08/12 10:09 AM      Result Value Range   Pro B Natriuretic peptide (BNP) 1205.0 (*) 0 - 125 pg/mL  HEPARIN LEVEL (UNFRACTIONATED)     Status: Abnormal   Collection Time    11/08/12 10:09 AM      Result Value Range   Heparin Unfractionated 1.88 (*) 0.30 - 0.70 IU/mL  APTT     Status: Abnormal   Collection Time    11/08/12 10:09 AM      Result Value Range   aPTT 47 (*) 24 - 37 seconds  GLUCOSE, CAPILLARY     Status: Abnormal   Collection Time    11/08/12 11:56 AM      Result Value Range   Glucose-Capillary 183 (*) 70 - 99 mg/dL  GLUCOSE, CAPILLARY     Status: Abnormal    Collection Time    11/08/12  4:39 PM      Result Value Range   Glucose-Capillary 172 (*) 70 - 99 mg/dL  HEPARIN LEVEL (UNFRACTIONATED)     Status: Abnormal   Collection Time    11/08/12  7:04 PM      Result Value Range   Heparin Unfractionated 0.73 (*) 0.30 - 0.70 IU/mL  APTT     Status: Abnormal   Collection Time    11/08/12  7:04 PM      Result Value Range   aPTT 73 (*) 24 - 37 seconds  GLUCOSE, CAPILLARY     Status: Abnormal   Collection Time    11/08/12 10:23 PM      Result Value Range   Glucose-Capillary 160 (*) 70 - 99 mg/dL   Comment 1 Documented in Chart     Comment 2 Notify RN    CBC WITH DIFFERENTIAL     Status: Abnormal   Collection Time    11/09/12  6:11 AM      Result Value Range   WBC 10.4  4.0 - 10.5 K/uL   RBC 3.92  3.87 - 5.11 MIL/uL   Hemoglobin 10.4 (*) 12.0 - 15.0 g/dL   HCT 10.9 (*) 60.4 - 54.0 %   MCV 77.0 (*) 78.0 - 100.0 fL   MCH 26.5  26.0 - 34.0 pg   MCHC 34.4  30.0 - 36.0 g/dL   RDW 98.1  19.1 - 47.8 %   Platelets 442 (*) 150 - 400 K/uL   Neutrophils Relative 76  43 - 77 %   Neutro Abs 7.9 (*) 1.7 - 7.7 K/uL   Lymphocytes Relative 14  12 - 46 %   Lymphs Abs 1.4  0.7 - 4.0 K/uL   Monocytes Relative 7  3 - 12 %   Monocytes Absolute 0.7  0.1 - 1.0 K/uL   Eosinophils Relative 3  0 - 5 %   Eosinophils Absolute 0.3  0.0 - 0.7 K/uL   Basophils Relative  0  0 - 1 %   Basophils Absolute 0.0  0.0 - 0.1 K/uL  BASIC METABOLIC PANEL     Status: Abnormal   Collection Time    11/09/12  6:11 AM      Result Value Range   Sodium 134 (*) 135 - 145 mEq/L   Potassium 4.0  3.5 - 5.1 mEq/L   Chloride 97  96 - 112 mEq/L   CO2 25  19 - 32 mEq/L   Glucose, Bld 166 (*) 70 - 99 mg/dL   BUN 15  6 - 23 mg/dL   Creatinine, Ser 1.19  0.50 - 1.10 mg/dL   Calcium 9.5  8.4 - 14.7 mg/dL   GFR calc non Af Amer 57 (*) >90 mL/min   GFR calc Af Amer 66 (*) >90 mL/min  PRO B NATRIURETIC PEPTIDE     Status: Abnormal   Collection Time    11/09/12  6:11 AM      Result Value  Range   Pro B Natriuretic peptide (BNP) 1155.0 (*) 0 - 125 pg/mL  HEPARIN LEVEL (UNFRACTIONATED)     Status: None   Collection Time    11/09/12  6:11 AM      Result Value Range   Heparin Unfractionated 0.38  0.30 - 0.70 IU/mL  APTT     Status: Abnormal   Collection Time    11/09/12  6:11 AM      Result Value Range   aPTT 67 (*) 24 - 37 seconds  GLUCOSE, CAPILLARY     Status: Abnormal   Collection Time    11/09/12  7:44 AM      Result Value Range   Glucose-Capillary 148 (*) 70 - 99 mg/dL   Comment 1 Documented in Chart     Comment 2 Notify RN     Ct Angio Chest Pe W/cm &/or Wo Cm  11/08/2012  *RADIOLOGY REPORT*  Clinical Data: Shortness of breath, question pulmonary embolism, history hypertension, diabetes  CT ANGIOGRAPHY CHEST  Technique:  Multidetector CT imaging of the chest using the standard protocol during bolus administration of intravenous contrast. Multiplanar reconstructed images including MIPs were obtained and reviewed to evaluate the vascular anatomy.  Contrast: OMNIPAQUE IOHEXOL 350 MG/ML SOLN  Comparison: CT chest 11/04/2012  Findings: Aorta normal caliber without aneurysm or dissection. Small moderate pericardial effusion present. Bilateral pleural effusions left greater than right. Filling defects are identified within right lung pulmonary arterial branches consistent with pulmonary embolism.  Complete left lower lobe and partial right lower lobe atelectasis. Partial atelectasis of left upper lobe/lingula also present. No pulmonary infiltrate or pleural effusion. Visualized portions of liver and spleen unremarkable. No acute osseous findings.  IMPRESSION: Filling defects within right upper and middle lobe pulmonary arterial branches consistent with pulmonary embolism. Bilateral pleural effusions and lower lobe atelectasis greater on the left. Moderate pericardial effusion.  Critical Value/emergent results were called by telephone at the time of interpretation on 11/08/2012  at 0047 to [facility] on [id number], who verbally acknowledged these results.   Original Report Authenticated By: Ulyses Southward, M.D.     ECHO 11/06/12 Study Conclusions  - Left ventricle: The cavity size was normal. There was mild concentric hypertrophy. Systolic function was normal. The estimated ejection fraction was in the range of 60% to 65%. Wall motion was normal; there were no regional wall motion abnormalities. Doppler parameters are consistent with abnormal left ventricular relaxation (grade 1 diastolic dysfunction). - Pericardium, extracardiac: A trivial pericardial effusion was  identified posterior to the heart. There was a large left pleural effusion.   Assessment  Pt is a 52 y.o. patient with significant PMHx for Depression, HTN admitted with chest pain and shortness of breath    Plan:   1. Chest Pain secondary to HCAP and PE on CTA vs pericarditis secondary to possible rheumatologic condition vs Grade One Diastolic CHF - Cath done end of February 2014 without evidence of CAD  1) WBC decreased from 13 to 10 today.  Day 5 of Levaquin, Day 2 of Cefepime/Vanc for HCAP.    2) CT chest w/o contrast shows left sided pleural effusion with possible pericardial fluid. Consult to both PCCM and cards, greatly appreciate recs. IJ placed by PCCM and thoracentesis attempted x 2 without success, IR does not want to perform as well  3) Repeat CBC daily, continue pulse ox and O2 as needed (3-4 L last night).  Albuterol PRN for SOB  4) CTA shows RUL and RLL PE, continues to be hypoxic on 3 L and tachycardic.  Continue IV heparin before switching back to Xarelto in 1-2 days.   Hepatitis panel pending, HIV negative.   5) 2D ECHO with minimal amont of posterior pericardial fluid.  Repeat Echo on 3/14 shows similar findings.  Diuresed with 40 mg Lasix x 2 yesterday as BNP elevated from 319 to 1000+, consider continuing diuresis , ANA negative, RF slightly positive. ESR elevated to 85.    2.  Hyperlipidemia - Continue Pravachol   3. DM II - Continue SSI.  Will stop Glipizide on D/C on d/c   4. Depression - Continue Wellbutrin   5. HTN - Continue home meds  6. AKI on CKD Stage III - Resolved   1) Recheck BMP daily   7. Constipation - Miralax daily and colace as needed secondary to Narcotic use  FEN/GI:  Heart Healthy, Central Line and PIV in place Prophylaxis:  Will switch to IV heparin today per pulm Disposition: Pending clinical improvement  Code Status: Full   Marlies Ligman R. Paulina Fusi, DO of [facility] 11/09/2012, 8:54 AM

## 2012-11-09 NOTE — Progress Notes (Signed)
Pt vomited large amount of watery green emesis. No cx of pain, just feeling of fullness. Pt with no BM since 3/8.

## 2012-11-09 NOTE — Progress Notes (Signed)
Subjective:  Improving shortness of breath and blood pressure control.  Objective:  Vital Signs in the last 24 hours: Temp:  [97.4 F (36.3 C)-98.1 F (36.7 C)] 97.9 F (36.6 C) (03/15 0951) Pulse Rate:  [92-102] 92 (03/15 0951) Cardiac Rhythm:  [-] Sinus tachycardia (03/15 0853) Resp:  [20-22] 20 (03/15 0951) BP: (133-175)/(97-117) 133/99 mmHg (03/15 0951) SpO2:  [93 %-97 %] 96 % (03/15 0951) Weight:  [82.9 kg (182 lb 12.2 oz)] 82.9 kg (182 lb 12.2 oz) (03/14 2119)  Physical Exam: BP Readings from Last 1 Encounters:  11/09/12 133/99     Wt Readings from Last 1 Encounters:  11/08/12 82.9 kg (182 lb 12.2 oz)    Weight change:   HEENT: [place]/AT, Eyes-Brown, PERL, EOMI, Conjunctiva-Pink, Sclera-Non-icteric Neck: No JVD, No bruit, Trachea midline. Lungs:  Clearing, Bilateral. Cardiac:  Regular rhythm, normal S1 and S2, no S3.  Abdomen:  Soft, non-tender. Extremities:  No edema present. No cyanosis. No clubbing. CNS: AxOx3, Cranial nerves grossly intact, moves all 4 extremities. Right handed. Skin: Warm and dry.   Intake/Output from previous day: 03/14 0701 - 03/15 0700 In: 2227.6 [P.O.:1320; I.V.:135.6; IV Piggyback:532] Out: 3600 [Urine:3600]    Lab Results: BMET    Component Value Date/Time   NA 134* 11/09/2012 0611   K 4.0 11/09/2012 0611   CL 97 11/09/2012 0611   CO2 25 11/09/2012 0611   GLUCOSE 166* 11/09/2012 0611   BUN 15 11/09/2012 0611   CREATININE 1.09 11/09/2012 0611   CREATININE 1.50* 01/29/2012 1616   CREATININE 1.50* 01/29/2012 1616   CALCIUM 9.5 11/09/2012 0611   GFRNONAA 57* 11/09/2012 0611   GFRAA 66* 11/09/2012 0611   CBC    Component Value Date/Time   WBC 10.4 11/09/2012 0611   WBC 12.9* 11/03/2012 1723   RBC 3.92 11/09/2012 0611   RBC 4.40 11/03/2012 1723   HGB 10.4* 11/09/2012 0611   HGB 11.6* 11/03/2012 1723   HCT 30.2* 11/09/2012 0611   HCT 36.8* 11/03/2012 1723   PLT 442* 11/09/2012 0611   MCV 77.0* 11/09/2012 0611   MCV 83.6 11/03/2012 1723   MCH 26.5  11/09/2012 0611   MCH 26.4* 11/03/2012 1723   MCHC 34.4 11/09/2012 0611   MCHC 31.5* 11/03/2012 1723   RDW 14.8 11/09/2012 0611   LYMPHSABS 1.4 11/09/2012 0611   MONOABS 0.7 11/09/2012 0611   EOSABS 0.3 11/09/2012 0611   BASOSABS 0.0 11/09/2012 0611   CARDIAC ENZYMES Lab Results  Component Value Date   TROPONINI <0.30 11/03/2012    Scheduled Meds: . amLODipine  5 mg Oral Daily  . antiseptic oral rinse  15 mL Mouth Rinse BID  . aspirin EC  81 mg Oral Daily  . buPROPion  50 mg Oral Daily  . ceFEPime (MAXIPIME) IV  1 g Intravenous Q8H  . insulin aspart  0-5 Units Subcutaneous QHS  . insulin aspart  0-9 Units Subcutaneous TID WC  . isosorbide-hydrALAZINE  1 tablet Oral TID  . levofloxacin  750 mg Oral Daily  . nebivolol  5 mg Oral Daily  . polyethylene glycol  17 g Oral Daily  . simvastatin  10 mg Oral q1800  . sodium chloride  10-40 mL Intracatheter Q12H  . sodium chloride  3 mL Intravenous Q12H  . vancomycin  1,000 mg Intravenous Q12H   Continuous Infusions: . heparin 1,100 Units/hr (11/09/12 0851)   PRN Meds:.albuterol, docusate sodium, hydrALAZINE, morphine injection, ondansetron (ZOFRAN) IV, ondansetron, oxyCODONE-acetaminophen, sodium chloride  Assessment/Plan:  PE  DVT  Chest pain  Bilateral pleural effusions, left more than right.  Trivial pericardial effusion  Possible pneumonia  Possible pulmonary embolism  Hypertension  CKD, II  Anxiety  Continue medical treatment   LOS: 6 days    Orpah Cobb  MD  11/09/2012, 10:53 AM

## 2012-11-09 NOTE — Progress Notes (Signed)
ANTICOAGULATION CONSULT NOTE - Follow Up Consult  Pharmacy Consult for Heparin Indication: pulmonary embolus  No Known Allergies  Patient Measurements: Height: 5\' 4"  (162.6 cm) Weight: 182 lb 12.2 oz (82.9 kg) IBW/kg (Calculated) : 54.7 Heparin Dosing Weight: 79 kg  Vital Signs: Temp: 98 F (36.7 C) (03/15 0517) Temp src: Oral (03/15 0517) BP: 147/97 mmHg (03/15 0517) Pulse Rate: 101 (03/15 0517)  Labs:  Recent Labs  11/07/12 0600 11/08/12 0550 11/08/12 1009 11/08/12 1904 11/09/12 0611  HGB 10.4* 10.5*  --   --  10.4*  HCT 30.6* 30.4*  --   --  30.2*  PLT 333 390  --   --  442*  APTT  --   --  47* 73* 67*  HEPARINUNFRC  --   --  1.88* 0.73* 0.38  CREATININE 1.32* 1.02  --   --  1.09    Estimated Creatinine Clearance: 62.9 ml/min (by C-G formula based on Cr of 1.09).   Assessment: 51 YOF on heparin (transitioned from xarelto) for acute SVT and B/L PE. Heparin was started yesterday, xarelto last dose was 3/13 at 1700. aPTT at goal- 67. Heparin level seems to have normalized and coordinates with aPTT - 0.38.  Levels are therapeutic but at low end of goal.    Goal of Therapy:  APTT level = 66-102 second Heparin level 0.3-0.7 units/ml Monitor platelets by anticoagulation protocol: Yes   Plan:  - Increase Heparin to 1100 units/hr - heparin level in 6 hrs - F/u transition back to Xarelto on 3/16   Doris Cheadle, PharmD Clinical Pharmacist Pager: [phone] Phone: [phone] 11/09/2012 8:09 AM

## 2012-11-09 NOTE — Progress Notes (Addendum)
Physical Therapy Treatment Patient Details Name: Cynthia Cameron MRN: [id number] DOB: [birth year] Today's Date: 11/09/2012 Time: 4098-1191 PT Time Calculation (min): 30 min  PT Assessment / Plan / Recommendation Comments on Treatment Session  Pt adm with SOB.  Found to have DVT and PE.  Pt with much improved mobility.    Follow Up Recommendations  No PT follow up     Does the patient have the potential to tolerate intense rehabilitation     Barriers to Discharge        Equipment Recommendations  None recommended by PT    Recommendations for Other Services    Frequency Min 3X/week   Plan Discharge plan remains appropriate;Frequency remains appropriate    Precautions / Restrictions Precautions Precautions: None   Pertinent Vitals/Pain SaO2 95% on 3L with amb.    Mobility  Transfers Sit to Stand: 4: Min guard;With upper extremity assist;With armrests;From chair/3-in-1;From toilet Stand to Sit: 4: Min guard;With upper extremity assist;With armrests;To chair/3-in-1;To toilet Details for Transfer Assistance: verbal cues with hand placement Ambulation/Gait Ambulation/Gait Assistance: 5: Supervision Ambulation Distance (Feet): 160 Feet Assistive device: Rolling walker Ambulation/Gait Assistance Details: No loss of balance Gait Pattern: Step-through pattern;Decreased stride length Gait velocity: decreased General Gait Details: Used rolling walker in hall but didn't use in room.    Exercises     PT Diagnosis:    PT Problem List:   PT Treatment Interventions:     PT Goals Acute Rehab PT Goals PT Goal: Sit to Stand - Progress: Progressing toward goal PT Goal: Stand to Sit - Progress: Progressing toward goal PT Goal: Ambulate - Progress: Progressing toward goal  Visit Information  Last PT Received On: 11/09/12    Subjective Data  Subjective: Pt states it feels good to be up and moving.   Cognition  Cognition Overall Cognitive Status: Appears within functional  limits for tasks assessed/performed Arousal/Alertness: Awake/alert Orientation Level: Appears intact for tasks assessed Behavior During Session: [facility] for tasks performed    Balance  Static Standing Balance Static Standing - Balance Support: No upper extremity supported;During functional activity Static Standing - Level of Assistance: 5: Stand by assistance  End of Session PT - End of Session Equipment Utilized During Treatment: Oxygen Activity Tolerance: Patient tolerated treatment well Patient left: in chair;with call bell/phone within reach Nurse Communication: Mobility status   GP     [facility] 11/09/2012, 3:54 PM  [facility] PT [phone]

## 2012-11-10 ENCOUNTER — Inpatient Hospital Stay (HOSPITAL_COMMUNITY): Payer: 59

## 2012-11-10 LAB — GLUCOSE, CAPILLARY
Glucose-Capillary: 159 mg/dL — ABNORMAL HIGH (ref 70–99)
Glucose-Capillary: 170 mg/dL — ABNORMAL HIGH (ref 70–99)

## 2012-11-10 LAB — CBC WITH DIFFERENTIAL/PLATELET
Basophils Absolute: 0 10*3/uL (ref 0.0–0.1)
HCT: 33.1 % — ABNORMAL LOW (ref 36.0–46.0)
Hemoglobin: 11.2 g/dL — ABNORMAL LOW (ref 12.0–15.0)
Lymphocytes Relative: 17 % (ref 12–46)
Monocytes Absolute: 0.8 10*3/uL (ref 0.1–1.0)
Monocytes Relative: 9 % (ref 3–12)
Neutro Abs: 6 10*3/uL (ref 1.7–7.7)
Neutrophils Relative %: 70 % (ref 43–77)
RDW: 15 % (ref 11.5–15.5)
WBC: 8.6 10*3/uL (ref 4.0–10.5)

## 2012-11-10 LAB — PROCALCITONIN: Procalcitonin: 0.13 ng/mL

## 2012-11-10 LAB — BASIC METABOLIC PANEL
CO2: 27 mEq/L (ref 19–32)
Chloride: 95 mEq/L — ABNORMAL LOW (ref 96–112)
Creatinine, Ser: 1.13 mg/dL — ABNORMAL HIGH (ref 0.50–1.10)
Potassium: 3.6 mEq/L (ref 3.5–5.1)

## 2012-11-10 LAB — HEPARIN LEVEL (UNFRACTIONATED): Heparin Unfractionated: 0.3 IU/mL (ref 0.30–0.70)

## 2012-11-10 MED ORDER — CLONIDINE HCL 0.1 MG PO TABS
0.1000 mg | ORAL_TABLET | Freq: Three times a day (TID) | ORAL | Status: DC
  Administered 2012-11-10 – 2012-11-13 (×10): 0.1 mg via ORAL
  Filled 2012-11-10 (×13): qty 1

## 2012-11-10 MED ORDER — AMLODIPINE BESYLATE 5 MG PO TABS
5.0000 mg | ORAL_TABLET | Freq: Two times a day (BID) | ORAL | Status: DC
  Administered 2012-11-10 – 2012-11-13 (×6): 5 mg via ORAL
  Filled 2012-11-10 (×7): qty 1

## 2012-11-10 MED ORDER — FUROSEMIDE 40 MG PO TABS
40.0000 mg | ORAL_TABLET | Freq: Every day | ORAL | Status: AC
  Administered 2012-11-10: 40 mg via ORAL
  Filled 2012-11-10: qty 1

## 2012-11-10 NOTE — Progress Notes (Signed)
ANTICOAGULATION CONSULT NOTE - Follow Up Consult  Pharmacy Consult for Heparin Indication: pulmonary embolus  No Known Allergies  Patient Measurements: Height: 5\' 4"  (162.6 cm) Weight: 182 lb 12.2 oz (82.901 kg) IBW/kg (Calculated) : 54.7 Heparin Dosing Weight: 79 kg  Vital Signs: Temp: 98.9 F (37.2 C) (03/16 0434) Temp src: Oral (03/16 0434) BP: 153/81 mmHg (03/16 0434) Pulse Rate: 94 (03/16 0434)  Labs:  Recent Labs  11/08/12 0550  11/08/12 1009 11/08/12 1904 11/09/12 0611 11/09/12 1624 11/10/12 0550  HGB 10.5*  --   --   --  10.4*  --  11.2*  HCT 30.4*  --   --   --  30.2*  --  33.1*  PLT 390  --   --   --  442*  --  440*  APTT  --   --  47* 73* 67*  --   --   HEPARINUNFRC  --   < > 1.88* 0.73* 0.38 0.31 0.30  CREATININE 1.02  --   --   --  1.09  --  1.13*  < > = values in this interval not displayed.  Estimated Creatinine Clearance: 60.7 ml/min (by C-G formula based on Cr of 1.13).   Assessment: 51 YOF on heparin (transitioned from xarelto) for acute SVT and B/L PE. Heparin was started yesterday, xarelto last dose was 3/13 at 1700.  Heparin level this am 0.30.  Levels are therapeutic but at low end of goal.    Goal of Therapy:  Heparin level 0.3-0.7 units/ml Monitor platelets by anticoagulation protocol: Yes   Plan:  -Continue heparin drip at 1100 units/hr -Follow up transition back to Xarelto -Daily CBC and heparin level while continuing on heparin   Doris Cheadle, PharmD Clinical Pharmacist Pager: [phone] Phone: [phone] 11/10/2012 7:45 AM

## 2012-11-10 NOTE — Progress Notes (Signed)
PGY-1 Daily Progress Note Family Medicine Teaching Service Service Pager: [phone]   Subjective: Doing well. States was able to walk yesterday and felt well. Still hasn't had a bowel movement. Had some prune juice to help with this.  Objective:  VITALS Temp:  [98 F (36.7 C)-99 F (37.2 C)] 98.4 F (36.9 C) (03/16 0835) Pulse Rate:  [90-109] 105 (03/16 0835) Resp:  [18-20] 18 (03/16 0835) BP: (146-153)/(81-115) 151/115 mmHg (03/16 0835) SpO2:  [97 %-100 %] 97 % (03/16 0835) Weight:  [182 lb 12.2 oz (82.901 kg)] 182 lb 12.2 oz (82.901 kg) (03/15 2034)  In/Out  Intake/Output Summary (Last 24 hours) at 11/10/12 0957 Last data filed at 11/10/12 0600  Gross per 24 hour  Intake 1088.8 ml  Output   1250 ml  Net -161.2 ml    Physical Exam: Gen: NAD, in bed on [place] 3.0 L CV: rrr, no murmur appreciate, regular rhythm  Lungs: tachypnea, decreased breath sounds throughout, no wheezing or crackles today  Abd: soft/nontender/nondistended/normal bowel sounds  MSK: moves all extremities, trace edmea  Skin: warm and dry, no rash  Neuro: grossly normal   MEDS Scheduled Meds: . amLODipine  5 mg Oral BID  . antiseptic oral rinse  15 mL Mouth Rinse BID  . aspirin EC  81 mg Oral Daily  . buPROPion  50 mg Oral Daily  . ceFEPime (MAXIPIME) IV  1 g Intravenous Q8H  . cloNIDine  0.1 mg Oral TID  . insulin aspart  0-5 Units Subcutaneous QHS  . insulin aspart  0-9 Units Subcutaneous TID WC  . isosorbide-hydrALAZINE  1 tablet Oral TID  . levofloxacin  750 mg Oral Daily  . nebivolol  5 mg Oral Daily  . polyethylene glycol  17 g Oral Daily  . simvastatin  10 mg Oral q1800  . sodium chloride  10-40 mL Intracatheter Q12H  . sodium chloride  3 mL Intravenous Q12H  . vancomycin  1,000 mg Intravenous Q12H   Continuous Infusions: . heparin 1,100 Units/hr (11/10/12 0944)   PRN Meds:.albuterol, docusate sodium, hydrALAZINE, morphine injection, ondansetron (ZOFRAN) IV, ondansetron,  oxyCODONE-acetaminophen, sodium chloride  Labs and imaging:   CBC  Recent Labs Lab 11/08/12 0550 11/09/12 0611 11/10/12 0550  WBC 13.2* 10.4 8.6  HGB 10.5* 10.4* 11.2*  HCT 30.4* 30.2* 33.1*  PLT 390 442* 440*   BMET/CMET  Recent Labs Lab 11/08/12 0550 11/08/12 1009 11/09/12 0611 11/10/12 0550  NA 137  --  134* 134*  K 3.3*  --  4.0 3.6  CL 100  --  97 95*  CO2 24  --  25 27  BUN 15  --  15 15  CREATININE 1.02  --  1.09 1.13*  CALCIUM 9.3  --  9.5 9.8  PROT  --  7.4  --   --   BILITOT  --  0.3  --   --   ALKPHOS  --  171*  --   --   ALT  --  30  --   --   AST  --  23  --   --   GLUCOSE 184*  --  166* 204*   Results for orders placed during the hospital encounter of 11/03/12 (from the past 24 hour(s))  GLUCOSE, CAPILLARY     Status: Abnormal   Collection Time    11/09/12 11:52 AM      Result Value Range   Glucose-Capillary 186 (*) 70 - 99 mg/dL  HEPARIN LEVEL (UNFRACTIONATED)  Status: None   Collection Time    11/09/12  4:24 PM      Result Value Range   Heparin Unfractionated 0.31  0.30 - 0.70 IU/mL  GLUCOSE, CAPILLARY     Status: Abnormal   Collection Time    11/09/12  5:11 PM      Result Value Range   Glucose-Capillary 171 (*) 70 - 99 mg/dL  GLUCOSE, CAPILLARY     Status: Abnormal   Collection Time    11/09/12  8:33 PM      Result Value Range   Glucose-Capillary 136 (*) 70 - 99 mg/dL  CBC WITH DIFFERENTIAL     Status: Abnormal   Collection Time    11/10/12  5:50 AM      Result Value Range   WBC 8.6  4.0 - 10.5 K/uL   RBC 4.27  3.87 - 5.11 MIL/uL   Hemoglobin 11.2 (*) 12.0 - 15.0 g/dL   HCT 16.1 (*) 09.6 - 04.5 %   MCV 77.5 (*) 78.0 - 100.0 fL   MCH 26.2  26.0 - 34.0 pg   MCHC 33.8  30.0 - 36.0 g/dL   RDW 40.9  81.1 - 91.4 %   Platelets 440 (*) 150 - 400 K/uL   Neutrophils Relative 70  43 - 77 %   Neutro Abs 6.0  1.7 - 7.7 K/uL   Lymphocytes Relative 17  12 - 46 %   Lymphs Abs 1.4  0.7 - 4.0 K/uL   Monocytes Relative 9  3 - 12 %    Monocytes Absolute 0.8  0.1 - 1.0 K/uL   Eosinophils Relative 4  0 - 5 %   Eosinophils Absolute 0.3  0.0 - 0.7 K/uL   Basophils Relative 1  0 - 1 %   Basophils Absolute 0.0  0.0 - 0.1 K/uL  BASIC METABOLIC PANEL     Status: Abnormal   Collection Time    11/10/12  5:50 AM      Result Value Range   Sodium 134 (*) 135 - 145 mEq/L   Potassium 3.6  3.5 - 5.1 mEq/L   Chloride 95 (*) 96 - 112 mEq/L   CO2 27  19 - 32 mEq/L   Glucose, Bld 204 (*) 70 - 99 mg/dL   BUN 15  6 - 23 mg/dL   Creatinine, Ser 7.82 (*) 0.50 - 1.10 mg/dL   Calcium 9.8  8.4 - 95.6 mg/dL   GFR calc non Af Amer 55 (*) >90 mL/min   GFR calc Af Amer 64 (*) >90 mL/min  HEPARIN LEVEL (UNFRACTIONATED)     Status: None   Collection Time    11/10/12  5:50 AM      Result Value Range   Heparin Unfractionated 0.30  0.30 - 0.70 IU/mL  PROCALCITONIN     Status: None   Collection Time    11/10/12  5:50 AM      Result Value Range   Procalcitonin 0.13    GLUCOSE, CAPILLARY     Status: Abnormal   Collection Time    11/10/12  7:38 AM      Result Value Range   Glucose-Capillary 159 (*) 70 - 99 mg/dL   No results found.  ECHO 11/06/12 Study Conclusions  - Left ventricle: The cavity size was normal. There was mild concentric hypertrophy. Systolic function was normal. The estimated ejection fraction was in the range of 60% to 65%. Wall motion was normal; there were no regional wall motion abnormalities.  Doppler parameters are consistent with abnormal left ventricular relaxation (grade 1 diastolic dysfunction). - Pericardium, extracardiac: A trivial pericardial effusion was identified posterior to the heart. There was a large left pleural effusion.  Dg Chest 2 View  11/10/2012 IMPRESSION: Improved bilateral pleural effusions and bibasilar airspace disease left greater than right.   Original Report Authenticated By: Jolaine Click, M.D.    Assessment  Pt is a 52 y.o. patient with significant PMHx for Depression, HTN admitted  with chest pain and shortness of breath    Plan:   1. Chest Pain secondary to HCAP and PE on CTA vs pericarditis secondary to possible rheumatologic condition vs Grade One Diastolic CHF - Cath done end of February 2014 without evidence of CAD  1) WBC decreased from 13 to 8.6 today.  Day 6 of Levaquin, Day 3 of Cefepime/Vanc for HCAP.    2) CT chest w/o contrast shows left sided pleural effusion with possible pericardial fluid. Consult to both PCCM and cards, greatly appreciate recs. IJ placed by PCCM and thoracentesis attempted x 2 without success, IR does not want to perform as well  3) Repeat CBC daily, continue pulse ox and O2 as needed (3 L last night).  Albuterol PRN for SOB  4) CTA shows RUL and RLL PE, continues to be hypoxic on 3 L and tachycardic.  Continue IV heparin before switching back to Xarelto tomorrow.   Hepatitis panel pending, HIV negative.   5) 2D ECHO with minimal amont of posterior pericardial fluid.  Repeat Echo on 3/14 shows similar findings.  Diuresed with 40 mg Lasix x 2 2 days ago as BNP elevated from 319 to 1000+, will obtain CXR 2V and give additional dose of lasix today, ANA negative, RF slightly positive. ESR elevated to 85.    2. Hyperlipidemia - Continue Pravachol   3. DM II - Continue SSI.  Will stop Glipizide on D/C   4. Depression - Continue Wellbutrin   5. HTN - Continue home meds  6. AKI on CKD Stage III - Resolved   1) Recheck BMP daily   7. Constipation - Miralax daily and colace as needed secondary to Narcotic use. Will f/u if prune juice helps this afternoon.  FEN/GI:  Heart Healthy, Central Line and PIV in place Prophylaxis:  On heparin, will transition to xarelto tomorrow Disposition: Pending clinical improvement  Code Status: Full   Marikay Alar, MD of [facility] 11/10/2012, 9:57 AM

## 2012-11-10 NOTE — Progress Notes (Signed)
FMTS Attending NOte Patient seen and examined by me, discussed with Dr Birdie Sons and I agree with his assess/plan of care. See my separate note of today for further details. [name], MD

## 2012-11-10 NOTE — Progress Notes (Signed)
Subjective:  Feeling better. CXR- essentially unchanged.  Objective:  Vital Signs in the last 24 hours: Temp:  [98 F (36.7 C)-99 F (37.2 C)] 98.4 F (36.9 C) (03/16 0835) Pulse Rate:  [90-109] 105 (03/16 0835) Cardiac Rhythm:  [-] Normal sinus rhythm (03/15 2108) Resp:  [18-20] 18 (03/16 0835) BP: (146-153)/(81-115) 151/115 mmHg (03/16 0835) SpO2:  [97 %-100 %] 97 % (03/16 0835) Weight:  [82.901 kg (182 lb 12.2 oz)] 82.901 kg (182 lb 12.2 oz) (03/15 2034)  Physical Exam: BP Readings from Last 1 Encounters:  11/10/12 151/115     Wt Readings from Last 1 Encounters:  11/09/12 82.901 kg (182 lb 12.2 oz)    Weight change: 0.001 kg (0 oz)  HEENT: /AT, Eyes-Brown, PERL, EOMI, Conjunctiva-Pink, Sclera-Non-icteric Neck: No JVD, No bruit, Trachea midline. Lungs:  Clear, Bilateral. Cardiac:  Regular rhythm, normal S1 and S2, no S3.  Abdomen:  Soft, non-tender. Extremities:  No edema present. No cyanosis. No clubbing. CNS: AxOx3, Cranial nerves grossly intact, moves all 4 extremities. Right handed. Skin: Warm and dry.   Intake/Output from previous day: 03/15 0701 - 03/16 0700 In: 1208.8 [P.O.:720; I.V.:138.8; IV Piggyback:350] Out: 1750 [Urine:1750]    Lab Results: BMET    Component Value Date/Time   NA 134* 11/10/2012 0550   K 3.6 11/10/2012 0550   CL 95* 11/10/2012 0550   CO2 27 11/10/2012 0550   GLUCOSE 204* 11/10/2012 0550   BUN 15 11/10/2012 0550   CREATININE 1.13* 11/10/2012 0550   CREATININE 1.50* 01/29/2012 1616   CREATININE 1.50* 01/29/2012 1616   CALCIUM 9.8 11/10/2012 0550   GFRNONAA 55* 11/10/2012 0550   GFRAA 64* 11/10/2012 0550   CBC    Component Value Date/Time   WBC 8.6 11/10/2012 0550   WBC 12.9* 11/03/2012 1723   RBC 4.27 11/10/2012 0550   RBC 4.40 11/03/2012 1723   HGB 11.2* 11/10/2012 0550   HGB 11.6* 11/03/2012 1723   HCT 33.1* 11/10/2012 0550   HCT 36.8* 11/03/2012 1723   PLT 440* 11/10/2012 0550   MCV 77.5* 11/10/2012 0550   MCV 83.6 11/03/2012 1723   MCH 26.2  11/10/2012 0550   MCH 26.4* 11/03/2012 1723   MCHC 33.8 11/10/2012 0550   MCHC 31.5* 11/03/2012 1723   RDW 15.0 11/10/2012 0550   LYMPHSABS 1.4 11/10/2012 0550   MONOABS 0.8 11/10/2012 0550   EOSABS 0.3 11/10/2012 0550   BASOSABS 0.0 11/10/2012 0550   CARDIAC ENZYMES Lab Results  Component Value Date   TROPONINI <0.30 11/03/2012    Scheduled Meds: . amLODipine  5 mg Oral BID  . antiseptic oral rinse  15 mL Mouth Rinse BID  . aspirin EC  81 mg Oral Daily  . buPROPion  50 mg Oral Daily  . ceFEPime (MAXIPIME) IV  1 g Intravenous Q8H  . cloNIDine  0.1 mg Oral TID  . furosemide  40 mg Oral Daily  . insulin aspart  0-5 Units Subcutaneous QHS  . insulin aspart  0-9 Units Subcutaneous TID WC  . isosorbide-hydrALAZINE  1 tablet Oral TID  . levofloxacin  750 mg Oral Daily  . nebivolol  5 mg Oral Daily  . polyethylene glycol  17 g Oral Daily  . simvastatin  10 mg Oral q1800  . sodium chloride  10-40 mL Intracatheter Q12H  . sodium chloride  3 mL Intravenous Q12H  . vancomycin  1,000 mg Intravenous Q12H   Continuous Infusions: . heparin 1,100 Units/hr (11/10/12 0944)   PRN Meds:.albuterol, docusate sodium,  hydrALAZINE, morphine injection, ondansetron (ZOFRAN) IV, ondansetron, oxyCODONE-acetaminophen, sodium chloride  Assessment/Plan: PE  DVT  Chest pain  Bilateral pleural effusions, left more than right.  Trivial pericardial effusion  Possible pneumonia  Possible pulmonary embolism  Hypertension  CKD, II  Anxiety   Continue medical treatment F/U 1 week post discharge.   LOS: 7 days    Cynthia Cobb  MD  11/10/2012, 10:28 AM

## 2012-11-10 NOTE — Progress Notes (Signed)
FMTS Attending Note Patient seen and examined by me, she appears to be making improvement in her respiratory status.  Says she felt well walking yesterday, but is having nausea and had some vomiting yesterday.  Still has not moved her bowels in several days.  Denies abd pain.   A/P: Patient with dyspnea and chest pain that are improving; related to several causes including R-sided PEs, a L>R pleural effusion that is thought to be parapneumonic; possibly a touch of pulmonary edema on top of all this.  She is improving on HAP therapy and with some diuresis as well. Continue on heparin gtt before transitioning to oral agent (Xarelto) tomorrow.  For 2-v CXR today as well.  To ambulate with PT and try and remain out of bed during the day.  May try to titrate down her oxygen supplementation as well. [name], MD

## 2012-11-11 LAB — GLUCOSE, CAPILLARY
Glucose-Capillary: 149 mg/dL — ABNORMAL HIGH (ref 70–99)
Glucose-Capillary: 132 mg/dL — ABNORMAL HIGH (ref 70–99)
Glucose-Capillary: 109 mg/dL — ABNORMAL HIGH (ref 70–99)
Glucose-Capillary: 168 mg/dL — ABNORMAL HIGH (ref 70–99)

## 2012-11-11 LAB — BASIC METABOLIC PANEL
BUN: 16 mg/dL (ref 6–23)
Calcium: 10.1 mg/dL (ref 8.4–10.5)
Creatinine, Ser: 1.24 mg/dL — ABNORMAL HIGH (ref 0.50–1.10)
GFR calc Af Amer: 57 mL/min — ABNORMAL LOW (ref >90)

## 2012-11-11 LAB — CBC WITH DIFFERENTIAL/PLATELET
Basophils Absolute: 0.1 10*3/uL (ref 0.0–0.1)
Basophils Relative: 1 % (ref 0–1)
Eosinophils Absolute: 0.4 10*3/uL (ref 0.0–0.7)
Eosinophils Relative: 4 % (ref 0–5)
HCT: 31.4 % — ABNORMAL LOW (ref 36.0–46.0)
Hemoglobin: 10.6 g/dL — ABNORMAL LOW (ref 12.0–15.0)
MCH: 26.7 pg (ref 26.0–34.0)
MCHC: 33.8 g/dL (ref 30.0–36.0)
MCV: 79.1 fL (ref 78.0–100.0)
Monocytes Absolute: 0.6 10*3/uL (ref 0.1–1.0)
Monocytes Relative: 7 % (ref 3–12)
RDW: 14.9 % (ref 11.5–15.5)

## 2012-11-11 LAB — HEPARIN LEVEL (UNFRACTIONATED): Heparin Unfractionated: 0.23 IU/mL — ABNORMAL LOW (ref 0.30–0.70)

## 2012-11-11 LAB — HEPATITIS PANEL, ACUTE
HCV Ab: NEGATIVE
Hep A IgM: NEGATIVE

## 2012-11-11 LAB — RAPID URINE DRUG SCREEN, HOSP PERFORMED
Amphetamines: NOT DETECTED
Tetrahydrocannabinol: NOT DETECTED

## 2012-11-11 MED ORDER — RIVAROXABAN 15 MG PO TABS
15.0000 mg | ORAL_TABLET | Freq: Two times a day (BID) | ORAL | Status: DC
  Administered 2012-11-11 – 2012-11-13 (×5): 15 mg via ORAL
  Filled 2012-11-11 (×7): qty 1

## 2012-11-11 MED ORDER — HYDROCOD POLST-CHLORPHEN POLST 10-8 MG/5ML PO LQCR
5.0000 mL | Freq: Two times a day (BID) | ORAL | Status: DC | PRN
  Administered 2012-11-11 (×2): 5 mL via ORAL
  Filled 2012-11-11 (×2): qty 5

## 2012-11-11 NOTE — Progress Notes (Signed)
ANTIBIOTIC - FOLLOW UP  Consult Note  Pharmacy Consult for Levaquin + Cefepime + Vancomycin Indication: Broadened HCAP coverage   No Known Allergies  Patient Measurements: Height: 5\' 4"  (162.6 cm) Weight: 182 lb 12.2 oz (82.901 kg) IBW/kg (Calculated) : 54.7  Vital Signs: Temp: 98 F (36.7 C) (03/17 1000) Temp src: Oral (03/17 1000) BP: 132/81 mmHg (03/17 1000) Pulse Rate: 103 (03/17 1000) Intake/Output from previous day: 03/16 0701 - 03/17 0700 In: 601 [P.O.:480; I.V.:121] Out: 1751 [Urine:1750; Stool:1] Intake/Output from this shift: Total I/O In: 945.8 [P.O.:240; I.V.:155.8; IV Piggyback:550] Out: 1200 [Urine:1200]  Labs:  Recent Labs  11/09/12 0611 11/10/12 0550 11/11/12 0511  WBC 10.4 8.6 8.8  HGB 10.4* 11.2* 10.6*  PLT 442* 440* 434*  CREATININE 1.09 1.13* 1.24*   Estimated Creatinine Clearance: 55.3 ml/min (by C-G formula based on Cr of 1.24).  Recent Labs  11/11/12 1225  VANCOTROUGH 16.2     Microbiology: No results found for this or any previous visit (from the past 720 hour(s)).  Anti-infectives   Start     Dose/Rate Route Frequency Ordered Stop   11/08/12 2300  vancomycin (VANCOCIN) IVPB 1000 mg/200 mL premix     1,000 mg 200 mL/hr over 60 Minutes Intravenous Every 12 hours 11/08/12 0941     11/08/12 2000  ceFEPIme (MAXIPIME) 1 g in dextrose 5 % 50 mL IVPB     1 g 100 mL/hr over 30 Minutes Intravenous Every 8 hours 11/08/12 0941     11/08/12 1030  vancomycin (VANCOCIN) IVPB 1000 mg/200 mL premix     1,000 mg 200 mL/hr over 60 Minutes Intravenous STAT 11/08/12 0939 11/08/12 1232   11/08/12 1030  ceFEPIme (MAXIPIME) 1 g in dextrose 5 % 50 mL IVPB     1 g 100 mL/hr over 30 Minutes Intravenous STAT 11/08/12 0939 11/08/12 1215   11/07/12 1200  levofloxacin (LEVAQUIN) tablet 750 mg     750 mg Oral Daily 11/07/12 0858     11/05/12 1400  levofloxacin (LEVAQUIN) IVPB 750 mg  Status:  Discontinued     750 mg 100 mL/hr over 90 Minutes Intravenous  Every 24 hours 11/05/12 1313 11/07/12 0857   11/05/12 0600  cefTRIAXone (ROCEPHIN) injection 1 g  Status:  Discontinued     1 g Intramuscular Every 24 hours 11/04/12 1512 11/05/12 1054   11/04/12 0445  cefTRIAXone (ROCEPHIN) 1 g in dextrose 5 % 50 mL IVPB  Status:  Discontinued     1 g 100 mL/hr over 30 Minutes Intravenous Every 24 hours 11/04/12 0436 11/04/12 1512   11/04/12 0215  cefTRIAXone (ROCEPHIN) injection 1 g  Status:  Discontinued     1 g Intramuscular Every 24 hours 11/04/12 0211 11/04/12 0436      Assessment: 52 y.o. F who continues on Levaquin, Cefepime and Vancomycin.  CT Angio on 3/14 showed B/L pleural effusions and lower lobe atelectasis and confirmed B/L pulmonary emboli. The patient is afebrile, WBC is trending down, no cultures have been taken this admission. Scr has trended up.  Vancomycin trough = 16.2 today on vancomycin 1g q 12 hrs.  Goal of Therapy:  Proper antibiotics for infection/cultures adjusted for renal/hepatic function  Vancomycin trough 15-20  Plan:  1. Continue Vancomycin 1g IV every 12 hours 2. Continue Cefepime 1g IV every 12 hours 3. Continue Levaquin 750 mg po every 24 hours 4.?LOT abx  Tad Moore, BCPS  Clinical Pharmacist Pager [phone]  11/11/2012 1:53 PM

## 2012-11-11 NOTE — Progress Notes (Signed)
Pt off 02  And  Her sats 95-98 %  RA  Tolerating well

## 2012-11-11 NOTE — Progress Notes (Signed)
FMTS Attending Daily Note: Cynthia Olvera MD [phone] pager office [phone] I  have seen and examined this patient, reviewed their chart. I have discussed this patient with the resident. I agree with the resident's findings, assessment and care plan. 

## 2012-11-11 NOTE — Care Management Note (Signed)
Noted plan to change pt back to Xarelto today. This CM contacted pt insurance provider last week and was informed that pt copay for this drug would be $35 for 30 day supply. Can also provide pt with a card for a 10 day free supply. Pt will need two prescriptions, one for 10 days to use the free card x one and another prescription for ongoing Xarelto as needed. Please contact case manager for any questions.  Johny Shock RN MPH Case Manager [phone]

## 2012-11-11 NOTE — Progress Notes (Signed)
Utilization review completed.  

## 2012-11-11 NOTE — Progress Notes (Signed)
ANTICOAGULATION CONSULT NOTE - Follow Up Consult  Pharmacy Consult for heparin Indication: pulmonary embolus  Labs:  Recent Labs  11/08/12 1009 11/08/12 1904  11/09/12 0611 11/09/12 1624 11/10/12 0550 11/11/12 0511  HGB  --   --   < > 10.4*  --  11.2* 10.6*  HCT  --   --   --  30.2*  --  33.1* 31.4*  PLT  --   --   --  442*  --  440* 434*  APTT 47* 73*  --  67*  --   --   --   HEPARINUNFRC 1.88* 0.73*  --  0.38 0.31 0.30 0.23*  CREATININE  --   --   --  1.09  --  1.13* 1.24*  < > = values in this interval not displayed.   Assessment: 52yo female now subtherapeutic on heparin after a few levels at low end of goal.  Goal of Therapy:  Heparin level 0.3-0.7 units/ml   Plan:  Will increase heparin gtt by ~1 unit/hr to 1200 units/hr and f/u with transition to Xarelto.  Vernard Gambles, PharmD, BCPS  11/11/2012,6:57 AM

## 2012-11-11 NOTE — Progress Notes (Signed)
PGY-1 Daily Progress Note Family Medicine Teaching Service Service Pager: [phone]   Subjective: Doing much better today, able to walk up and down the hall without becoming SOB.   Objective:  VITALS Temp:  [97.4 F (36.3 C)-98.3 F (36.8 C)] 98.3 F (36.8 C) (03/17 0417) Pulse Rate:  [93-99] 93 (03/17 0417) Resp:  [17-18] 18 (03/17 0417) BP: (115-136)/(71-82) 136/79 mmHg (03/17 0417) SpO2:  [97 %-100 %] 97 % (03/17 0417) Weight:  [182 lb 12.2 oz (82.901 kg)] 182 lb 12.2 oz (82.901 kg) (03/16 2104)  In/Out  Intake/Output Summary (Last 24 hours) at 11/11/12 0853 Last data filed at 11/11/12 0710  Gross per 24 hour  Intake 1306.83 ml  Output   1751 ml  Net -444.17 ml    Physical Exam: Gen: NAD, in chair CV: rrr, no murmur appreciate, regular rhythm  Lungs: tachypnea, decreased breath sounds throughout, no wheezing or crackles today  Abd: soft/nontender/nondistended/normal bowel sounds  MSK: moves all extremities, trace edmea  Skin: warm and dry, no rash  Neuro: grossly normal   MEDS Scheduled Meds: . amLODipine  5 mg Oral BID  . antiseptic oral rinse  15 mL Mouth Rinse BID  . aspirin EC  81 mg Oral Daily  . buPROPion  50 mg Oral Daily  . ceFEPime (MAXIPIME) IV  1 g Intravenous Q8H  . cloNIDine  0.1 mg Oral TID  . insulin aspart  0-5 Units Subcutaneous QHS  . insulin aspart  0-9 Units Subcutaneous TID WC  . isosorbide-hydrALAZINE  1 tablet Oral TID  . levofloxacin  750 mg Oral Daily  . nebivolol  5 mg Oral Daily  . polyethylene glycol  17 g Oral Daily  . simvastatin  10 mg Oral q1800  . sodium chloride  10-40 mL Intracatheter Q12H  . sodium chloride  3 mL Intravenous Q12H  . vancomycin  1,000 mg Intravenous Q12H   Continuous Infusions: . heparin 1,200 Units/hr (11/11/12 0830)   PRN Meds:.albuterol, chlorpheniramine-HYDROcodone, docusate sodium, hydrALAZINE, morphine injection, ondansetron (ZOFRAN) IV, ondansetron, oxyCODONE-acetaminophen, sodium  chloride  Labs and imaging:   CBC  Recent Labs Lab 11/09/12 0611 11/10/12 0550 11/11/12 0511  WBC 10.4 8.6 8.8  HGB 10.4* 11.2* 10.6*  HCT 30.2* 33.1* 31.4*  PLT 442* 440* 434*   BMET/CMET  Recent Labs Lab 11/08/12 1009 11/09/12 0611 11/10/12 0550 11/11/12 0511  NA  --  134* 134* 137  K  --  4.0 3.6 3.6  CL  --  97 95* 98  CO2  --  25 27 27   BUN  --  15 15 16   CREATININE  --  1.09 1.13* 1.24*  CALCIUM  --  9.5 9.8 10.1  PROT 7.4  --   --   --   BILITOT 0.3  --   --   --   ALKPHOS 171*  --   --   --   ALT 30  --   --   --   AST 23  --   --   --   GLUCOSE  --  166* 204* 177*   Results for orders placed during the hospital encounter of 11/03/12 (from the past 24 hour(s))  GLUCOSE, CAPILLARY     Status: Abnormal   Collection Time    11/10/12 11:43 AM      Result Value Range   Glucose-Capillary 189 (*) 70 - 99 mg/dL   Comment 1 Documented in Chart     Comment 2 Notify RN    GLUCOSE,  CAPILLARY     Status: Abnormal   Collection Time    11/10/12  5:19 PM      Result Value Range   Glucose-Capillary 170 (*) 70 - 99 mg/dL   Comment 1 Documented in Chart     Comment 2 Notify RN    GLUCOSE, CAPILLARY     Status: Abnormal   Collection Time    11/10/12  9:07 PM      Result Value Range   Glucose-Capillary 176 (*) 70 - 99 mg/dL  CBC WITH DIFFERENTIAL     Status: Abnormal   Collection Time    11/11/12  5:11 AM      Result Value Range   WBC 8.8  4.0 - 10.5 K/uL   RBC 3.97  3.87 - 5.11 MIL/uL   Hemoglobin 10.6 (*) 12.0 - 15.0 g/dL   HCT 82.9 (*) 56.2 - 13.0 %   MCV 79.1  78.0 - 100.0 fL   MCH 26.7  26.0 - 34.0 pg   MCHC 33.8  30.0 - 36.0 g/dL   RDW 86.5  78.4 - 69.6 %   Platelets 434 (*) 150 - 400 K/uL   Neutrophils Relative 73  43 - 77 %   Neutro Abs 6.4  1.7 - 7.7 K/uL   Lymphocytes Relative 16  12 - 46 %   Lymphs Abs 1.4  0.7 - 4.0 K/uL   Monocytes Relative 7  3 - 12 %   Monocytes Absolute 0.6  0.1 - 1.0 K/uL   Eosinophils Relative 4  0 - 5 %   Eosinophils  Absolute 0.4  0.0 - 0.7 K/uL   Basophils Relative 1  0 - 1 %   Basophils Absolute 0.1  0.0 - 0.1 K/uL  BASIC METABOLIC PANEL     Status: Abnormal   Collection Time    11/11/12  5:11 AM      Result Value Range   Sodium 137  135 - 145 mEq/L   Potassium 3.6  3.5 - 5.1 mEq/L   Chloride 98  96 - 112 mEq/L   CO2 27  19 - 32 mEq/L   Glucose, Bld 177 (*) 70 - 99 mg/dL   BUN 16  6 - 23 mg/dL   Creatinine, Ser 2.95 (*) 0.50 - 1.10 mg/dL   Calcium 28.4  8.4 - 13.2 mg/dL   GFR calc non Af Amer 49 (*) >90 mL/min   GFR calc Af Amer 57 (*) >90 mL/min  HEPARIN LEVEL (UNFRACTIONATED)     Status: Abnormal   Collection Time    11/11/12  5:11 AM      Result Value Range   Heparin Unfractionated 0.23 (*) 0.30 - 0.70 IU/mL  GLUCOSE, CAPILLARY     Status: Abnormal   Collection Time    11/11/12  7:43 AM      Result Value Range   Glucose-Capillary 149 (*) 70 - 99 mg/dL   Comment 1 Notify RN     Dg Chest 2 View  11/10/2012  *RADIOLOGY REPORT*  Clinical Data: Pleural effusion.  Short of breath.  CHEST - 2 VIEW  Comparison: 11/05/2012  Findings: Central venous catheter stable.  Bilateral airspace disease and bilateral pleural effusions improved left greater than right.  Cardiomegaly.  No pneumothorax.  IMPRESSION: Improved bilateral pleural effusions and bibasilar airspace disease left greater than right.   Original Report Authenticated By: Jolaine Click, M.D.     [facility] 11/06/12 Study Conclusions  - Left ventricle: The cavity size was normal.  There was mild concentric hypertrophy. Systolic function was normal. The estimated ejection fraction was in the range of 60% to 65%. Wall motion was normal; there were no regional wall motion abnormalities. Doppler parameters are consistent with abnormal left ventricular relaxation (grade 1 diastolic dysfunction). - Pericardium, extracardiac: A trivial pericardial effusion was identified posterior to the heart. There was a large left pleural effusion.  Dg Chest 2  View  11/10/2012 IMPRESSION: Improved bilateral pleural effusions and bibasilar airspace disease left greater than right.   Original Report Authenticated By: Jolaine Click, M.D.    Assessment  Pt is a 52 y.o. patient with significant PMHx for Depression, HTN admitted with chest pain and shortness of breath    Plan:   1. Chest Pain secondary to HCAP and PE on CTA vs pericarditis secondary to possible rheumatologic condition vs Grade One Diastolic CHF - Cath done end of February 2014 without evidence of CAD  1) WBC decreased from 13 to 8.6 today.  Day 7 of Levaquin, Day 4 of Cefepime/Vanc for HCAP.    2) CT chest w/o contrast shows left sided pleural effusion with possible pericardial fluid. Consult to both PCCM and cards, greatly appreciate recs. IJ placed by PCCM and thoracentesis attempted x 2 without success, IR does not want to perform as well  3) Repeat CBC daily, continue pulse ox and O2 as needed (3 L last night).  Albuterol PRN for SOB  4) CTA shows RUL and RLL PE, will decrease O2 today and see how does, borderline tachycardia.  Will switch from heparin drip to Xarelto today  5) 2D ECHO with minimal amont of posterior pericardial fluid.  Repeat Echo on 3/14 shows similar findings.  Diuresed with 40 mg Lasix x 2 2 days ago as BNP elevated from 319 to 1000+, will obtain CXR 2V and give additional dose of lasix today, ANA negative, RF slightly positive. ESR elevated to 85.    2. Hyperlipidemia - Continue Pravachol   3. DM II - Continue SSI.  Will stop Glipizide on D/C   4. Depression - Continue Wellbutrin   5. HTN - Continue home meds  6. AKI on CKD Stage III - Resolved   1) Recheck BMP daily   7. Constipation - Miralax daily and colace as needed secondary to Narcotic use. Will f/u if prune juice helps this afternoon.  FEN/GI:  Heart Healthy, Central Line and PIV in place Prophylaxis:  Transition to Xarelto today.  Disposition: Pending clinical improvement  Code Status: Full    Marikay Alar, MD of [facility] 11/11/2012, 8:53 AM

## 2012-11-11 NOTE — Progress Notes (Signed)
Physical Therapy Treatment Patient Details Name: Cynthia Cameron MRN: [id number] DOB: 04-Aug-1961 Today's Date: 11/11/2012 Time: 4098-1191 PT Time Calculation (min): 25 min  PT Assessment / Plan / Recommendation Comments on Treatment Session  Pt adm with SOB.  Found to have DVT and PE.  Pt with much improved mobility.    Follow Up Recommendations  No PT follow up     Does the patient have the potential to tolerate intense rehabilitation     Barriers to Discharge        Equipment Recommendations  None recommended by PT    Recommendations for Other Services    Frequency Min 3X/week   Plan Discharge plan remains appropriate;Frequency remains appropriate    Precautions / Restrictions Precautions Precautions: Fall Restrictions Weight Bearing Restrictions: No   Pertinent Vitals/Pain SaO2 100% on 3L with amb    Mobility  Bed Mobility Bed Mobility: Sitting - Scoot to Edge of Bed Supine to Sit: 6: Modified independent (Device/Increase time);HOB elevated Sitting - Scoot to Edge of Bed: 6: Modified independent (Device/Increase time) Details for Bed Mobility Assistance: No difficulty Transfers Sit to Stand: 6: Modified independent (Device/Increase time);With upper extremity assist;From bed Stand to Sit: 6: Modified independent (Device/Increase time);With upper extremity assist;With armrests;To chair/3-in-1 Ambulation/Gait Ambulation/Gait Assistance: 5: Supervision Ambulation Distance (Feet): 700 Feet Assistive device: None Ambulation/Gait Assistance Details: No loss of balance Gait Pattern: Step-through pattern;Decreased stride length Gait velocity: decreased    Exercises     PT Diagnosis:    PT Problem List:   PT Treatment Interventions:     PT Goals Acute Rehab PT Goals PT Goal: Supine/Side to Sit - Progress: Progressing toward goal PT Goal: Sit to Supine/Side - Progress: Progressing toward goal PT Goal: Sit to Stand - Progress: Progressing toward goal PT Goal:  Stand to Sit - Progress: Progressing toward goal PT Goal: Ambulate - Progress: Progressing toward goal  Visit Information  Last PT Received On: 11/11/12 Assistance Needed: +1    Subjective Data  Subjective: Pt states she feels good.   Cognition  Cognition Overall Cognitive Status: Appears within functional limits for tasks assessed/performed Arousal/Alertness: Awake/alert Orientation Level: Appears intact for tasks assessed Behavior During Session: [facility] for tasks performed    Balance  Static Standing Balance Static Standing - Balance Support: No upper extremity supported Static Standing - Level of Assistance: 6: Modified independent (Device/Increase time)  End of Session PT - End of Session Equipment Utilized During Treatment: Oxygen Activity Tolerance: Patient tolerated treatment well Patient left: in chair;with call bell/phone within reach Nurse Communication: Mobility status   GP     [facility] 11/11/2012, 9:32 AM  [facility] PT [phone]

## 2012-11-12 LAB — CBC WITH DIFFERENTIAL/PLATELET
Basophils Absolute: 0 10*3/uL (ref 0.0–0.1)
Basophils Relative: 1 % (ref 0–1)
Hemoglobin: 9.4 g/dL — ABNORMAL LOW (ref 12.0–15.0)
Lymphocytes Relative: 17 % (ref 12–46)
MCHC: 33.7 g/dL (ref 30.0–36.0)
Monocytes Relative: 9 % (ref 3–12)
Neutro Abs: 5.3 10*3/uL (ref 1.7–7.7)
Neutrophils Relative %: 69 % (ref 43–77)
WBC: 7.8 10*3/uL (ref 4.0–10.5)

## 2012-11-12 LAB — GLUCOSE, CAPILLARY
Glucose-Capillary: 127 mg/dL — ABNORMAL HIGH (ref 70–99)
Glucose-Capillary: 123 mg/dL — ABNORMAL HIGH (ref 70–99)
Glucose-Capillary: 233 mg/dL — ABNORMAL HIGH (ref 70–99)

## 2012-11-12 LAB — BASIC METABOLIC PANEL
BUN: 17 mg/dL (ref 6–23)
Chloride: 99 mEq/L (ref 96–112)
GFR calc Af Amer: 61 mL/min — ABNORMAL LOW (ref >90)
Potassium: 3.6 mEq/L (ref 3.5–5.1)

## 2012-11-12 NOTE — Progress Notes (Signed)
PGY-1 Daily Progress Note Family Medicine Teaching Service Service Pager: [phone]   Subjective: Improving greatly, no palpitations, able to take deep inspirations and walk up and down the hall without becoming too short of breath.   Objective:  VITALS Temp:  [98 F (36.7 C)-98.5 F (36.9 C)] 98 F (36.7 C) (03/18 0602) Pulse Rate:  [80-103] 88 (03/18 0602) Resp:  [18] 18 (03/18 0602) BP: (107-136)/(75-89) 124/75 mmHg (03/18 0602) SpO2:  [93 %-100 %] 95 % (03/18 0602)  In/Out  Intake/Output Summary (Last 24 hours) at 11/12/12 0827 Last data filed at 11/12/12 0606  Gross per 24 hour  Intake    838 ml  Output   4201 ml  Net  -3363 ml    Physical Exam: Gen: NAD, in chair CV: rrr, no murmur appreciate, regular rhythm  Lungs: CTA B/L, no tachypnea today  Abd: soft/nontender/nondistended/normal bowel sounds  MSK: moves all extremities, trace edmea  Skin: warm and dry, no rash  Neuro: grossly normal   MEDS Scheduled Meds: . amLODipine  5 mg Oral BID  . antiseptic oral rinse  15 mL Mouth Rinse BID  . aspirin EC  81 mg Oral Daily  . buPROPion  50 mg Oral Daily  . ceFEPime (MAXIPIME) IV  1 g Intravenous Q8H  . cloNIDine  0.1 mg Oral TID  . insulin aspart  0-5 Units Subcutaneous QHS  . insulin aspart  0-9 Units Subcutaneous TID WC  . isosorbide-hydrALAZINE  1 tablet Oral TID  . levofloxacin  750 mg Oral Daily  . nebivolol  5 mg Oral Daily  . polyethylene glycol  17 g Oral Daily  . rivaroxaban  15 mg Oral BID  . simvastatin  10 mg Oral q1800  . sodium chloride  10-40 mL Intracatheter Q12H  . sodium chloride  3 mL Intravenous Q12H  . vancomycin  1,000 mg Intravenous Q12H   Continuous Infusions:   PRN Meds:.albuterol, chlorpheniramine-HYDROcodone, docusate sodium, hydrALAZINE, morphine injection, ondansetron (ZOFRAN) IV, ondansetron, oxyCODONE-acetaminophen, sodium chloride  Labs and imaging:   CBC  Recent Labs Lab 11/10/12 0550 11/11/12 0511 11/12/12 0500   WBC 8.6 8.8 7.8  HGB 11.2* 10.6* 9.4*  HCT 33.1* 31.4* 27.9*  PLT 440* 434* 386   BMET/CMET  Recent Labs Lab 11/08/12 1009  11/10/12 0550 11/11/12 0511 11/12/12 0500  NA  --   < > 134* 137 137  K  --   < > 3.6 3.6 3.6  CL  --   < > 95* 98 99  CO2  --   < > 27 27 29   BUN  --   < > 15 16 17   CREATININE  --   < > 1.13* 1.24* 1.17*  CALCIUM  --   < > 9.8 10.1 9.6  PROT 7.4  --   --   --   --   BILITOT 0.3  --   --   --   --   ALKPHOS 171*  --   --   --   --   ALT 30  --   --   --   --   AST 23  --   --   --   --   GLUCOSE  --   < > 204* 177* 144*  < > = values in this interval not displayed. Results for orders placed during the hospital encounter of 11/03/12 (from the past 24 hour(s))  GLUCOSE, CAPILLARY     Status: Abnormal   Collection Time  11/11/12 11:24 AM      Result Value Range   Glucose-Capillary 132 (*) 70 - 99 mg/dL   Comment 1 Notify RN    VANCOMYCIN, TROUGH     Status: None   Collection Time    11/11/12 12:25 PM      Result Value Range   Vancomycin Tr 16.2  10.0 - 20.0 ug/mL  URINE RAPID DRUG SCREEN (HOSP PERFORMED)     Status: None   Collection Time    11/11/12  5:15 PM      Result Value Range   Opiates NONE DETECTED  NONE DETECTED   Cocaine NONE DETECTED  NONE DETECTED   Benzodiazepines NONE DETECTED  NONE DETECTED   Amphetamines NONE DETECTED  NONE DETECTED   Tetrahydrocannabinol NONE DETECTED  NONE DETECTED   Barbiturates NONE DETECTED  NONE DETECTED  GLUCOSE, CAPILLARY     Status: Abnormal   Collection Time    11/11/12  5:33 PM      Result Value Range   Glucose-Capillary 109 (*) 70 - 99 mg/dL   Comment 1 Notify RN    GLUCOSE, CAPILLARY     Status: Abnormal   Collection Time    11/11/12  9:00 PM      Result Value Range   Glucose-Capillary 168 (*) 70 - 99 mg/dL  CBC WITH DIFFERENTIAL     Status: Abnormal   Collection Time    11/12/12  5:00 AM      Result Value Range   WBC 7.8  4.0 - 10.5 K/uL   RBC 3.54 (*) 3.87 - 5.11 MIL/uL    Hemoglobin 9.4 (*) 12.0 - 15.0 g/dL   HCT 16.1 (*) 09.6 - 04.5 %   MCV 78.8  78.0 - 100.0 fL   MCH 26.6  26.0 - 34.0 pg   MCHC 33.7  30.0 - 36.0 g/dL   RDW 40.9  81.1 - 91.4 %   Platelets 386  150 - 400 K/uL   Neutrophils Relative 69  43 - 77 %   Neutro Abs 5.3  1.7 - 7.7 K/uL   Lymphocytes Relative 17  12 - 46 %   Lymphs Abs 1.3  0.7 - 4.0 K/uL   Monocytes Relative 9  3 - 12 %   Monocytes Absolute 0.7  0.1 - 1.0 K/uL   Eosinophils Relative 5  0 - 5 %   Eosinophils Absolute 0.4  0.0 - 0.7 K/uL   Basophils Relative 1  0 - 1 %   Basophils Absolute 0.0  0.0 - 0.1 K/uL  BASIC METABOLIC PANEL     Status: Abnormal   Collection Time    11/12/12  5:00 AM      Result Value Range   Sodium 137  135 - 145 mEq/L   Potassium 3.6  3.5 - 5.1 mEq/L   Chloride 99  96 - 112 mEq/L   CO2 29  19 - 32 mEq/L   Glucose, Bld 144 (*) 70 - 99 mg/dL   BUN 17  6 - 23 mg/dL   Creatinine, Ser 7.82 (*) 0.50 - 1.10 mg/dL   Calcium 9.6  8.4 - 95.6 mg/dL   GFR calc non Af Amer 53 (*) >90 mL/min   GFR calc Af Amer 61 (*) >90 mL/min   Dg Chest 2 View  11/10/2012  *RADIOLOGY REPORT*  Clinical Data: Pleural effusion.  Short of breath.  CHEST - 2 VIEW  Comparison: 11/05/2012  Findings: Central venous catheter stable.  Bilateral airspace disease  and bilateral pleural effusions improved left greater than right.  Cardiomegaly.  No pneumothorax.  IMPRESSION: Improved bilateral pleural effusions and bibasilar airspace disease left greater than right.   Original Report Authenticated By: Jolaine Click, M.D.     [facility] 11/06/12 Study Conclusions  - Left ventricle: The cavity size was normal. There was mild concentric hypertrophy. Systolic function was normal. The estimated ejection fraction was in the range of 60% to 65%. Wall motion was normal; there were no regional wall motion abnormalities. Doppler parameters are consistent with abnormal left ventricular relaxation (grade 1 diastolic dysfunction). - Pericardium,  extracardiac: A trivial pericardial effusion was identified posterior to the heart. There was a large left pleural effusion.  Dg Chest 2 View  11/10/2012 IMPRESSION: Improved bilateral pleural effusions and bibasilar airspace disease left greater than right.   Original Report Authenticated By: Jolaine Click, M.D.    Assessment  Pt is a 52 y.o. patient with significant PMHx for Depression, HTN admitted with chest pain and shortness of breath    Plan:   1. Chest Pain secondary to HCAP and PE on CTA vs pericarditis secondary to possible rheumatologic condition vs Grade One Diastolic CHF - Cath done end of February 2014 without evidence of CAD  1) WBC decreased from 13 to 8.6 today.  Day 8 of Levaquin, Day 5 of Cefepime/Vanc for HCAP.  Consider transitioning today to PO Levaquin for total of 10-14 days  2) CT chest w/o contrast shows left sided pleural effusion with possible pericardial fluid. Consult to both PCCM and cards, greatly appreciate recs. IJ placed and will remove today.   3) Repeat CBC daily, continue pulse ox and O2 as needed (3 L last night).  Albuterol PRN for SOB  4) CTA shows RUL and RLL PE, did well on RA w sats > 94%, borderline tachycardia.  Xarelto 15 mg BID x 20 more days.   5) 2D ECHO with minimal amont of posterior pericardial fluid.  Repeat Echo on 3/14 shows similar findings.  Diuresed with mild improvement three days ago.   ANA negative, RF slightly positive. ESR elevated to 85. Consider repeating CXR for pleural effusion improvements.    2. Hyperlipidemia - Continue Pravachol   3. DM II - Continue SSI.  Will stop Glipizide on D/C   4. Depression - Continue Wellbutrin   5. HTN - Continue home meds  6. AKI on CKD Stage III - Resolved   1) Recheck BMP daily   7. Constipation - Miralax daily and colace as needed secondary to Narcotic use.   FEN/GI:  Heart Healthy, D/C IJ today, PIV  Prophylaxis:  Xarelto 15 mg BID x 20 more days  Disposition: Pending clinical  improvement  Code Status: Full   Ethelreda Sukhu R. Paulina Fusi, DO of [facility] 11/12/2012, 8:27 AM

## 2012-11-12 NOTE — Progress Notes (Signed)
FMTS Attending Daily Note: Cynthia Bowery MD [phone] pager office [phone] I  have seen and examined this patient, reviewed their chart. I have discussed this patient with the resident. I agree with the resident's findings, assessment and care plan. 

## 2012-11-12 NOTE — Progress Notes (Signed)
Physical Therapy Treatment Patient Details Name: BRIGIT DOKE MRN: [id number] DOB: 17-Apr-1961 Today's Date: 11/12/2012 Time: 4098-1191 PT Time Calculation (min): 25 min  PT Assessment / Plan / Recommendation Comments on Treatment Session  Pt with DVT and PE and now mobilizing well maintaining sats 94-96% throughout on RA with conversation during ambulation. Pt has met all acute goals, back to baseline function, all education complete and no further needs at this time with pt agreeable to discharge from therapy. Pt educated for continued bil LE movement with daily activities to help prevent DVT and aware of need to stop during ambulation to catch her breath if any SOB.    Follow Up Recommendations  No PT follow up     Does the patient have the potential to tolerate intense rehabilitation     Barriers to Discharge        Equipment Recommendations       Recommendations for Other Services    Frequency     Plan Discharge plan remains appropriate    Precautions / Restrictions Precautions Precautions: None   Pertinent Vitals/Pain No pain    Mobility  Bed Mobility Bed Mobility: Not assessed Transfers Sit to Stand: 7: Independent;From bed Stand to Sit: 7: Independent;To bed Ambulation/Gait Ambulation/Gait Assistance: 7: Independent Ambulation Distance (Feet): 700 Feet, 3 standing rest breaks to recover although sats remained in mid 90s at all times Assistive device: None Gait Pattern: Within Functional Limits Stairs: Yes Stairs Assistance: 7: Independent Stair Management Technique: No rails Number of Stairs: 4    Exercises     PT Diagnosis:    PT Problem List:   PT Treatment Interventions:     PT Goals Acute Rehab PT Goals PT Goal: Sit to Stand - Progress: Met PT Goal: Stand to Sit - Progress: Met PT Goal: Ambulate - Progress: Met PT Goal: Up/Down Stairs - Progress: Met  Visit Information  Last PT Received On: 11/12/12 Assistance Needed: +1    Subjective  Data  Subjective: I'm so glad I'm doing better   Cognition  Cognition Overall Cognitive Status: Appears within functional limits for tasks assessed/performed Arousal/Alertness: Awake/alert Orientation Level: Appears intact for tasks assessed Behavior During Session: [facility] for tasks performed    Balance     End of Session PT - End of Session Activity Tolerance: Patient tolerated treatment well Patient left: in bed;with call bell/phone within reach (EOB per request)   GP     Toney Sang Beth 11/12/2012, 2:46 PM Delaney Meigs, PT [phone]

## 2012-11-13 LAB — BASIC METABOLIC PANEL
Calcium: 9.6 mg/dL (ref 8.4–10.5)
GFR calc non Af Amer: 53 mL/min — ABNORMAL LOW (ref >90)
Sodium: 135 mEq/L (ref 135–145)

## 2012-11-13 LAB — GLUCOSE, CAPILLARY: Glucose-Capillary: 129 mg/dL — ABNORMAL HIGH (ref 70–99)

## 2012-11-13 LAB — CBC WITH DIFFERENTIAL/PLATELET
Basophils Absolute: 0.1 10*3/uL (ref 0.0–0.1)
Eosinophils Absolute: 0.4 10*3/uL (ref 0.0–0.7)
Eosinophils Relative: 4 % (ref 0–5)
MCH: 26.1 pg (ref 26.0–34.0)
MCV: 78.4 fL (ref 78.0–100.0)
Platelets: 371 10*3/uL (ref 150–400)
RDW: 14.9 % (ref 11.5–15.5)

## 2012-11-13 MED ORDER — HYDROCOD POLST-CHLORPHEN POLST 10-8 MG/5ML PO LQCR: 5.0000 mL | Freq: Two times a day (BID) | ORAL | Status: DC | PRN

## 2012-11-13 MED ORDER — RIVAROXABAN 15 MG PO TABS: 15.0000 mg | ORAL_TABLET | Freq: Two times a day (BID) | ORAL | Status: DC

## 2012-11-13 MED ORDER — DSS 100 MG PO CAPS: 100.0000 mg | ORAL_CAPSULE | Freq: Every day | ORAL | Status: DC | PRN

## 2012-11-13 MED ORDER — RIVAROXABAN 15 MG PO TABS: 15.0000 mg | ORAL_TABLET | Freq: Every day | ORAL | Status: DC

## 2012-11-13 MED ORDER — RIVAROXABAN 20 MG PO TABS: 20.0000 mg | ORAL_TABLET | Freq: Every day | ORAL | Status: DC

## 2012-11-13 MED ORDER — LEVOFLOXACIN 750 MG PO TABS: 750.0000 mg | ORAL_TABLET | Freq: Every day | ORAL | Status: DC

## 2012-11-13 NOTE — Care Management Note (Signed)
   CARE MANAGEMENT NOTE 11/13/2012  Patient:  Cynthia Cameron, Cynthia Cameron   Account Number:  [id number]  Date Initiated:  11/11/2012  Documentation initiated by:  Darlyne Russian  Subjective/Objective Assessment:   Patient admitted with LLL pneumonia, LLE DVT.     Action/Plan:   Progression of care and discharge planning  Pt given card for 10 day free supply of Xarelto, pt insurance will cover this medication with cost of $35 for 30 days.   Anticipated DC Date:  11/13/2012   Anticipated DC Plan:  HOME/SELF CARE      DC Planning Services  Medication Assistance      Choice offered to / List presented to:             Status of service:  Completed, signed off Medicare Important Message given?   (If response is "NO", the following Medicare IM given date fields will be blank) Date Medicare IM given:   Date Additional Medicare IM given:    Discharge Disposition:  HOME/SELF CARE  Per UR Regulation:    If discussed at Long Length of Stay Meetings, dates discussed:    Comments:  11/13/2012 Noted plan to d/c today, Dr Adriana Simas notified of need for two Xarelto prescriptions, one for 10 days to use with 10 day free card and another for remaining Xarelto as needed. Pt informed and notified that her insurance reported that Xarelto is covered with copay of $35 for 30 day supply. Johny Shock RN MPH Case Manager [phone]

## 2012-11-13 NOTE — Progress Notes (Signed)
PGY-1 Daily Progress Note Family Medicine Teaching Service Service Pager: [phone]   Subjective: Improving greatly, ready to go home, able to walk up and down the hall without getting SOB, no O2 requirements last night, no fever, chills.   Objective:  VITALS Temp:  [97.7 F (36.5 C)-98.1 F (36.7 C)] 97.7 F (36.5 C) (03/19 0440) Pulse Rate:  [60-107] 91 (03/19 0440) Resp:  [18] 18 (03/19 0440) BP: (107-145)/(74-91) 125/76 mmHg (03/19 0440) SpO2:  [93 %-96 %] 93 % (03/19 0440) Weight:  [178 lb 12.7 oz (81.1 kg)] 178 lb 12.7 oz (81.1 kg) (03/18 2139)  In/Out  Intake/Output Summary (Last 24 hours) at 11/13/12 5784 Last data filed at 11/12/12 1300  Gross per 24 hour  Intake    480 ml  Output    401 ml  Net     79 ml    Physical Exam: Gen: NAD, in chair CV: rrr, no murmur appreciate Lungs: CTA B/L, no tachypnea today  Abd: soft/nontender/nondistended/normal bowel sounds  MSK: moves all extremities, trace edmea  Skin: warm and dry, no rash  Neuro: grossly normal   MEDS Scheduled Meds: . amLODipine  5 mg Oral BID  . antiseptic oral rinse  15 mL Mouth Rinse BID  . aspirin EC  81 mg Oral Daily  . buPROPion  50 mg Oral Daily  . cloNIDine  0.1 mg Oral TID  . insulin aspart  0-5 Units Subcutaneous QHS  . insulin aspart  0-9 Units Subcutaneous TID WC  . isosorbide-hydrALAZINE  1 tablet Oral TID  . levofloxacin  750 mg Oral Daily  . nebivolol  5 mg Oral Daily  . polyethylene glycol  17 g Oral Daily  . rivaroxaban  15 mg Oral BID  . simvastatin  10 mg Oral q1800  . sodium chloride  3 mL Intravenous Q12H   Continuous Infusions:   PRN Meds:.albuterol, chlorpheniramine-HYDROcodone, docusate sodium, hydrALAZINE, morphine injection, ondansetron (ZOFRAN) IV, ondansetron, oxyCODONE-acetaminophen  Labs and imaging:   CBC  Recent Labs Lab 11/11/12 0511 11/12/12 0500 11/13/12 0605  WBC 8.8 7.8 8.3  HGB 10.6* 9.4* 9.3*  HCT 31.4* 27.9* 28.0*  PLT 434* 386 371    BMET/CMET  Recent Labs Lab 11/08/12 1009  11/11/12 0511 11/12/12 0500 11/13/12 0605  NA  --   < > 137 137 135  K  --   < > 3.6 3.6 3.6  CL  --   < > 98 99 98  CO2  --   < > 27 29 25   BUN  --   < > 16 17 19   CREATININE  --   < > 1.24* 1.17* 1.16*  CALCIUM  --   < > 10.1 9.6 9.6  PROT 7.4  --   --   --   --   BILITOT 0.3  --   --   --   --   ALKPHOS 171*  --   --   --   --   ALT 30  --   --   --   --   AST 23  --   --   --   --   GLUCOSE  --   < > 177* 144* 111*  < > = values in this interval not displayed. Results for orders placed during the hospital encounter of 11/03/12 (from the past 24 hour(s))  GLUCOSE, CAPILLARY     Status: Abnormal   Collection Time    11/12/12 11:06 AM  Result Value Range   Glucose-Capillary 151 (*) 70 - 99 mg/dL  GLUCOSE, CAPILLARY     Status: Abnormal   Collection Time    11/12/12  5:06 PM      Result Value Range   Glucose-Capillary 123 (*) 70 - 99 mg/dL  GLUCOSE, CAPILLARY     Status: Abnormal   Collection Time    11/12/12  9:41 PM      Result Value Range   Glucose-Capillary 233 (*) 70 - 99 mg/dL  CBC WITH DIFFERENTIAL     Status: Abnormal   Collection Time    11/13/12  6:05 AM      Result Value Range   WBC 8.3  4.0 - 10.5 K/uL   RBC 3.57 (*) 3.87 - 5.11 MIL/uL   Hemoglobin 9.3 (*) 12.0 - 15.0 g/dL   HCT 82.9 (*) 56.2 - 13.0 %   MCV 78.4  78.0 - 100.0 fL   MCH 26.1  26.0 - 34.0 pg   MCHC 33.2  30.0 - 36.0 g/dL   RDW 86.5  78.4 - 69.6 %   Platelets 371  150 - 400 K/uL   Neutrophils Relative 70  43 - 77 %   Neutro Abs 5.8  1.7 - 7.7 K/uL   Lymphocytes Relative 17  12 - 46 %   Lymphs Abs 1.4  0.7 - 4.0 K/uL   Monocytes Relative 8  3 - 12 %   Monocytes Absolute 0.7  0.1 - 1.0 K/uL   Eosinophils Relative 4  0 - 5 %   Eosinophils Absolute 0.4  0.0 - 0.7 K/uL   Basophils Relative 1  0 - 1 %   Basophils Absolute 0.1  0.0 - 0.1 K/uL  BASIC METABOLIC PANEL     Status: Abnormal   Collection Time    11/13/12  6:05 AM      Result  Value Range   Sodium 135  135 - 145 mEq/L   Potassium 3.6  3.5 - 5.1 mEq/L   Chloride 98  96 - 112 mEq/L   CO2 25  19 - 32 mEq/L   Glucose, Bld 111 (*) 70 - 99 mg/dL   BUN 19  6 - 23 mg/dL   Creatinine, Ser 2.95 (*) 0.50 - 1.10 mg/dL   Calcium 9.6  8.4 - 28.4 mg/dL   GFR calc non Af Amer 53 (*) >90 mL/min   GFR calc Af Amer 62 (*) >90 mL/min   No results found.  ECHO 11/06/12 Study Conclusions  - Left ventricle: The cavity size was normal. There was mild concentric hypertrophy. Systolic function was normal. The estimated ejection fraction was in the range of 60% to 65%. Wall motion was normal; there were no regional wall motion abnormalities. Doppler parameters are consistent with abnormal left ventricular relaxation (grade 1 diastolic dysfunction). - Pericardium, extracardiac: A trivial pericardial effusion was identified posterior to the heart. There was a large left pleural effusion.  Dg Chest 2 View  11/10/2012 IMPRESSION: Improved bilateral pleural effusions and bibasilar airspace disease left greater than right.   Original Report Authenticated By: Jolaine Click, M.D.    Assessment  Pt is a 52 y.o. patient with significant PMHx for Depression, HTN admitted with chest pain and shortness of breath    Plan:   1. Chest Pain secondary to HCAP and PE on CTA vs pericarditis secondary to possible rheumatologic condition vs Grade One Diastolic CHF - Cath done end of February 2014 without evidence of CAD  1)  WBC stable.  Day 9 of Levaquin, D/C Vanc/Cefipime yesterday.  Will do 14 days of Levaquin total   2) CT chest w/o contrast shows left sided pleural effusion with possible pericardial fluid. Consult to both PCCM and cards, greatly appreciate recs.  3) Repeat CBC daily, continue pulse ox and O2 as needed (3 L last night).  Albuterol PRN for SOB  4) CTA shows RUL and RLL PE, did well on RA w sats > 94%, borderline tachycardia.  Xarelto 15 mg BID x 19 more days.   5) 2D ECHO with  minimal amont of posterior pericardial fluid.  Repeat Echo on 3/14 shows similar findings.  Diuresed with mild improvement three days ago.   ANA negative, RF slightly positive. ESR elevated to 85.    2. Hyperlipidemia - Continue Pravachol   3. DM II - Continue SSI.  Will stop Glipizide on D/C   4. Depression - Continue Wellbutrin   5. HTN - Continue home meds  6. AKI on CKD Stage III - Resolved   1) Recheck BMP daily   7. Constipation - Miralax daily and colace as needed secondary to Narcotic use.   FEN/GI:  Heart Healthy, PIV  Prophylaxis:  Xarelto 15 mg BID x 19 more days  Disposition: D/C today  Code Status: Full   Cynthia Cameron R. Paulina Fusi, DO of [facility] 11/13/2012, 8:12 AM

## 2012-11-13 NOTE — Discharge Summary (Signed)
Family Medicine Teaching Service  Discharge Note : Attending Sara Neal MD Pager [phone] Office [phone] I have seen and examined this patient, reviewed their chart and discussed discharge planning wit the resident at the time of discharge. I agree with the discharge plan as above.  

## 2012-11-13 NOTE — Progress Notes (Signed)
FMTS Attending Daily Note: Haroun Cotham MD [phone] pager office [phone] I  have seen and examined this patient, reviewed their chart. I have discussed this patient with the resident. I agree with the resident's findings, assessment and care plan. 

## 2012-11-13 NOTE — Progress Notes (Signed)
Pt discharged to home after visit summary reviewed and pt capable of re verbalizing medications and follow up appointments. Pt remains stable. No signs and symptoms of distress. Educated to return to ER in the event of SOB, dizziness, chest pain, or fainting. Cheray Pardi, RN   

## 2012-11-18 ENCOUNTER — Ambulatory Visit: Payer: 59

## 2012-11-18 ENCOUNTER — Ambulatory Visit (INDEPENDENT_AMBULATORY_CARE_PROVIDER_SITE_OTHER): Payer: 59 | Admitting: Family Medicine

## 2012-11-18 ENCOUNTER — Inpatient Hospital Stay (HOSPITAL_COMMUNITY)
Admission: AD | Admit: 2012-11-18 | Discharge: 2012-11-28 | DRG: 186 | Disposition: A | Discharge status: Home / Self Care | Payer: 59 | Source: Ambulatory Visit | Attending: Cardiovascular Disease | Admitting: Cardiovascular Disease

## 2012-11-18 ENCOUNTER — Inpatient Hospital Stay (HOSPITAL_COMMUNITY): Payer: 59

## 2012-11-18 VITALS — BP 90/70 | HR 80 | Temp 98.7°F | Resp 18 | Wt 170.0 lb

## 2012-11-18 DIAGNOSIS — R0602 Shortness of breath: Secondary | ICD-10-CM

## 2012-11-18 DIAGNOSIS — Z0271 Encounter for disability determination: Secondary | ICD-10-CM

## 2012-11-18 DIAGNOSIS — I82402 Acute embolism and thrombosis of unspecified deep veins of left lower extremity: Secondary | ICD-10-CM

## 2012-11-18 DIAGNOSIS — D509 Iron deficiency anemia, unspecified: Secondary | ICD-10-CM | POA: Diagnosis present

## 2012-11-18 DIAGNOSIS — E119 Type 2 diabetes mellitus without complications: Secondary | ICD-10-CM | POA: Diagnosis present

## 2012-11-18 DIAGNOSIS — F329 Major depressive disorder, single episode, unspecified: Secondary | ICD-10-CM | POA: Diagnosis present

## 2012-11-18 DIAGNOSIS — J9819 Other pulmonary collapse: Secondary | ICD-10-CM | POA: Diagnosis present

## 2012-11-18 DIAGNOSIS — N182 Chronic kidney disease, stage 2 (mild): Secondary | ICD-10-CM | POA: Diagnosis present

## 2012-11-18 DIAGNOSIS — J189 Pneumonia, unspecified organism: Secondary | ICD-10-CM | POA: Diagnosis present

## 2012-11-18 DIAGNOSIS — F3289 Other specified depressive episodes: Secondary | ICD-10-CM | POA: Diagnosis present

## 2012-11-18 DIAGNOSIS — R091 Pleurisy: Secondary | ICD-10-CM

## 2012-11-18 DIAGNOSIS — Z7901 Long term (current) use of anticoagulants: Secondary | ICD-10-CM

## 2012-11-18 DIAGNOSIS — Z86711 Personal history of pulmonary embolism: Secondary | ICD-10-CM

## 2012-11-18 DIAGNOSIS — Y95 Nosocomial condition: Secondary | ICD-10-CM

## 2012-11-18 DIAGNOSIS — F411 Generalized anxiety disorder: Secondary | ICD-10-CM | POA: Diagnosis present

## 2012-11-18 DIAGNOSIS — R0600 Dyspnea, unspecified: Secondary | ICD-10-CM

## 2012-11-18 DIAGNOSIS — Z79899 Other long term (current) drug therapy: Secondary | ICD-10-CM

## 2012-11-18 DIAGNOSIS — J90 Pleural effusion, not elsewhere classified: Principal | ICD-10-CM | POA: Diagnosis present

## 2012-11-18 DIAGNOSIS — I2699 Other pulmonary embolism without acute cor pulmonale: Secondary | ICD-10-CM

## 2012-11-18 DIAGNOSIS — I129 Hypertensive chronic kidney disease with stage 1 through stage 4 chronic kidney disease, or unspecified chronic kidney disease: Secondary | ICD-10-CM | POA: Diagnosis present

## 2012-11-18 DIAGNOSIS — Z86718 Personal history of other venous thrombosis and embolism: Secondary | ICD-10-CM

## 2012-11-18 DIAGNOSIS — I498 Other specified cardiac arrhythmias: Secondary | ICD-10-CM | POA: Diagnosis present

## 2012-11-18 HISTORY — DX: Shortness of breath: R06.02

## 2012-11-18 LAB — CBC WITH DIFFERENTIAL/PLATELET
Basophils Absolute: 0 10*3/uL (ref 0.0–0.1)
Basophils Relative: 0 % (ref 0–1)
Eosinophils Absolute: 0.1 10*3/uL (ref 0.0–0.7)
Eosinophils Relative: 1 % (ref 0–5)
HCT: 30.5 % — ABNORMAL LOW (ref 36.0–46.0)
MCHC: 33.8 g/dL (ref 30.0–36.0)
MCV: 78 fL (ref 78.0–100.0)
Monocytes Absolute: 1.3 10*3/uL — ABNORMAL HIGH (ref 0.1–1.0)
Platelets: 463 10*3/uL — ABNORMAL HIGH (ref 150–400)
RDW: 14.8 % (ref 11.5–15.5)

## 2012-11-18 LAB — COMPREHENSIVE METABOLIC PANEL
ALT: 13 U/L (ref 0–35)
Albumin: 2.6 g/dL — ABNORMAL LOW (ref 3.5–5.2)
Alkaline Phosphatase: 103 U/L (ref 39–117)
Chloride: 98 mEq/L (ref 96–112)
Glucose, Bld: 87 mg/dL (ref 70–99)
Potassium: 4.7 mEq/L (ref 3.5–5.1)
Sodium: 135 mEq/L (ref 135–145)
Total Bilirubin: 0.4 mg/dL (ref 0.3–1.2)
Total Protein: 7.5 g/dL (ref 6.0–8.3)

## 2012-11-18 LAB — GLUCOSE, CAPILLARY: Glucose-Capillary: 113 mg/dL — ABNORMAL HIGH (ref 70–99)

## 2012-11-18 MED ORDER — SODIUM CHLORIDE 0.9 % IJ SOLN: 3.0000 mL | Freq: Two times a day (BID) | INTRAMUSCULAR | Status: DC

## 2012-11-18 MED ORDER — LISINOPRIL 20 MG PO TABS
20.0000 mg | ORAL_TABLET | Freq: Every day | ORAL | Status: DC
  Administered 2012-11-19 – 2012-11-28 (×10): 20 mg via ORAL
  Filled 2012-11-18 (×11): qty 1

## 2012-11-18 MED ORDER — DEXTROSE 5 % IV SOLN
1.0000 g | INTRAVENOUS | Status: AC
  Administered 2012-11-19 – 2012-11-28 (×10): 1 g via INTRAVENOUS
  Filled 2012-11-18 (×10): qty 10

## 2012-11-18 MED ORDER — ONDANSETRON HCL 4 MG PO TABS
4.0000 mg | ORAL_TABLET | Freq: Four times a day (QID) | ORAL | Status: DC | PRN
  Filled 2012-11-18 (×2): qty 1

## 2012-11-18 MED ORDER — RIVAROXABAN 20 MG PO TABS: 20.0000 mg | ORAL_TABLET | Freq: Every day | ORAL | Status: DC

## 2012-11-18 MED ORDER — CLONIDINE HCL 0.2 MG PO TABS
0.2000 mg | ORAL_TABLET | Freq: Every day | ORAL | Status: DC
  Administered 2012-11-18 – 2012-11-27 (×10): 0.2 mg via ORAL
  Filled 2012-11-18 (×11): qty 1

## 2012-11-18 MED ORDER — NEBIVOLOL HCL 5 MG PO TABS
5.0000 mg | ORAL_TABLET | Freq: Every day | ORAL | Status: DC
  Administered 2012-11-19 – 2012-11-28 (×10): 5 mg via ORAL
  Filled 2012-11-18 (×11): qty 1

## 2012-11-18 MED ORDER — OLANZAPINE 2.5 MG PO TABS
2.5000 mg | ORAL_TABLET | Freq: Every day | ORAL | Status: DC
  Administered 2012-11-18 – 2012-11-27 (×10): 2.5 mg via ORAL
  Filled 2012-11-18 (×11): qty 1

## 2012-11-18 MED ORDER — RIVAROXABAN 15 MG PO TABS
15.0000 mg | ORAL_TABLET | Freq: Two times a day (BID) | ORAL | Status: DC
  Administered 2012-11-18: 15 mg via ORAL
  Filled 2012-11-18 (×3): qty 1

## 2012-11-18 MED ORDER — CEFTRIAXONE SODIUM 1 G IJ SOLR: 1.0000 g | INTRAMUSCULAR | Status: DC

## 2012-11-18 MED ORDER — POTASSIUM CHLORIDE CRYS ER 20 MEQ PO TBCR
20.0000 meq | EXTENDED_RELEASE_TABLET | Freq: Two times a day (BID) | ORAL | Status: DC
  Administered 2012-11-18 – 2012-11-24 (×12): 20 meq via ORAL
  Filled 2012-11-18 (×13): qty 1

## 2012-11-18 MED ORDER — ALOGLIPTIN-METFORMIN HCL 12.5-1000 MG PO TABS: 1.0000 | ORAL_TABLET | Freq: Two times a day (BID) | ORAL | Status: DC

## 2012-11-18 MED ORDER — SODIUM CHLORIDE 0.9 % IJ SOLN: 3.0000 mL | INTRAMUSCULAR | Status: DC | PRN

## 2012-11-18 MED ORDER — DOCUSATE SODIUM 100 MG PO CAPS
100.0000 mg | ORAL_CAPSULE | Freq: Every day | ORAL | Status: DC | PRN
  Administered 2012-11-18 – 2012-11-20 (×3): 100 mg via ORAL
  Filled 2012-11-18 (×4): qty 1

## 2012-11-18 MED ORDER — BUPROPION HCL 100 MG PO TABS
50.0000 mg | ORAL_TABLET | Freq: Every day | ORAL | Status: DC
  Administered 2012-11-19 – 2012-11-28 (×10): 50 mg via ORAL
  Filled 2012-11-18 (×11): qty 0.5

## 2012-11-18 MED ORDER — SODIUM CHLORIDE 0.9 % IV SOLN
250.0000 mL | INTRAVENOUS | Status: DC | PRN
  Administered 2012-11-22: 500 mL via INTRAVENOUS

## 2012-11-18 MED ORDER — METFORMIN HCL 500 MG PO TABS
1000.0000 mg | ORAL_TABLET | Freq: Two times a day (BID) | ORAL | Status: DC
  Administered 2012-11-18: 1000 mg via ORAL
  Filled 2012-11-18 (×4): qty 2

## 2012-11-18 MED ORDER — LINAGLIPTIN 5 MG PO TABS
5.0000 mg | ORAL_TABLET | Freq: Every day | ORAL | Status: DC
  Administered 2012-11-19 – 2012-11-28 (×10): 5 mg via ORAL
  Filled 2012-11-18 (×11): qty 1

## 2012-11-18 MED ORDER — SIMVASTATIN 10 MG PO TABS
10.0000 mg | ORAL_TABLET | Freq: Every day | ORAL | Status: DC
  Administered 2012-11-18 – 2012-11-27 (×11): 10 mg via ORAL
  Filled 2012-11-18 (×11): qty 1

## 2012-11-18 MED ORDER — HYDROCOD POLST-CHLORPHEN POLST 10-8 MG/5ML PO LQCR
5.0000 mL | Freq: Two times a day (BID) | ORAL | Status: DC | PRN
  Administered 2012-11-18 – 2012-11-28 (×17): 5 mL via ORAL
  Filled 2012-11-18 (×17): qty 5

## 2012-11-18 MED ORDER — SODIUM CHLORIDE 0.9 % IJ SOLN
3.0000 mL | Freq: Two times a day (BID) | INTRAMUSCULAR | Status: DC
  Administered 2012-11-25 – 2012-11-26 (×2): 3 mL via INTRAVENOUS

## 2012-11-18 NOTE — Progress Notes (Signed)
[facility] [address], [place] [address] [phone]  Date:  11/18/2012   Name:  Cynthia Cameron   DOB:  [birth year]   MRN:  [id number]  PCP:  Abbe Amsterdam, MD    Chief Complaint: Follow-up   History of Present Illness:  Cynthia Cameron is a 52 y.o. very pleasant female patient who presents with the following:  She was recently treated at the hospital with pneumonia and PE, as well as a pericardiac fluid effusion.  She was discharged on 11/13/12 to home.    She presented here on 3/9 with CP and SOB- was sent to the ED where she was admitted for 10 days. She was treated for hospital acquired pneumonia and a PE .  She felt as though she is getting worse again over the last 3 days.  She notes cramps in her hands, soreness in both arms, nausea, vomited twice yesterday.  She does not feel as though she can eat- afraid she will throw up again.  She has some swelling in her lower legs bilaterally She feels "a little SOB," this has gotten worse since leaving the hospital.  She does not feel that she will be able to RTW.    She was being treated with antibiotics while in the hospital.  She is now done with her levaquin.   She continues to note pain in her side- under her left breast.  This pain led to her having a heart cath in February of this year  She was on O2 wile at the hospital, but not after discharge.   Patient Active Problem List  Diagnosis  . Diabetes mellitus without complication  . Hypertension  . Depression  . HAP (hospital-acquired pneumonia)  . Pleural effusion  . Pleuritis  . Pericardial effusion  . Dyspnea  . Pulmonary embolism  . DVT (deep venous thrombosis)    Past Medical History  Diagnosis Date  . Allergy   . Depression   . Diabetes mellitus without complication   . Hypertension   . Pneumonia 10/2012    Past Surgical History  Procedure Laterality Date  . Cesarean section    . Cardiac catheterization  2014     History  Substance Use Topics  . Smoking status: Never Smoker   . Smokeless tobacco: Never Used  . Alcohol Use: Yes     Comment: social    Family History  Problem Relation Age of Onset  . Hypertension Father   . Diabetes Mother   . Atrial fibrillation Mother     No Known Allergies  Medication list has been reviewed and updated.  Current Outpatient Prescriptions on File Prior to Visit  Medication Sig Dispense Refill  . Alogliptin-Metformin HCl (KAZANO) 12.12-998 MG TABS Take 1 tablet by mouth 2 (two) times daily.      Marland Kitchen buPROPion (WELLBUTRIN) 100 MG tablet Take 50 mg by mouth daily.       . chlorpheniramine-HYDROcodone (TUSSIONEX) 10-8 MG/5ML LQCR Take 5 mLs by mouth every 12 (twelve) hours as needed.  140 mL  0  . cloNIDine (CATAPRES) 0.2 MG tablet Take 0.2 mg by mouth at bedtime.      . cyanocobalamin 1000 MCG tablet Take 2,000 mcg by mouth daily.       Marland Kitchen docusate sodium 100 MG CAPS Take 100 mg by mouth daily as needed.  30 capsule  0  . levofloxacin (LEVAQUIN) 750 MG tablet Take 1 tablet (750 mg total) by mouth daily.  6 tablet  0  . lisinopril (PRINIVIL,ZESTRIL) 20 MG tablet Take 20 mg by mouth daily.      . Multiple Vitamin (MULTIVITAMIN) tablet Take 1 tablet by mouth daily.      . nebivolol (BYSTOLIC) 5 MG tablet Take 5 mg by mouth daily.      . potassium chloride (K-DUR,KLOR-CON) 10 MEQ tablet Take 20 mEq by mouth 2 (two) times daily.       . pravastatin (PRAVACHOL) 40 MG tablet Take 40 mg by mouth daily.      Melene Muller ON 11/23/2012] Rivaroxaban (XARELTO) 15 MG TABS tablet Take 1 tablet (15 mg total) by mouth 2 (two) times daily.  18 tablet  0  . [START ON 12/01/2012] Rivaroxaban (XARELTO) 20 MG TABS Take 1 tablet (20 mg total) by mouth daily.  30 tablet  0   No current facility-administered medications on file prior to visit.    Review of Systems:  As per HPI- otherwise negative.   Physical Examination: Filed Vitals:   11/18/12 1330  BP: 90/70  Pulse: 80   Temp: 98.7 F (37.1 C)  Resp: 18   Filed Vitals:   11/18/12 1330  Weight: 170 lb (77.111 kg)   Body mass index is 29.17 kg/(m^2). Ideal Body Weight:   desat to 91% with very slow walking around clinic.  Placed on 2L via [place] and her O2 improved to 97%, she felt better  GEN: WDWN, NAD, Non-toxic, A & O x 3, appears fatigued HEENT: Atraumatic, Normocephalic. Neck supple. No masses, No LAD.  Bilateral TM wnl, oropharynx normal.  PEERL,EOMI.   Ears and Nose: No external deformity. CV: RRR, No M/G/R. No JVD. No thrill. No extra heart sounds. PULM: minimal to no breath sounds in the left lower lung, no wheezes, crackles, rhonchi. No retractions. No resp. distress. No accessory muscle use. EXTR: moderate peripheral edema NEURO Normal gait.  PSYCH: Normally interactive. Conversant. Not depressed or anxious appearing.  Calm demeanor.   [facility] reading (PRIMARY) by  Dr. Patsy Lager. CXR: large pleural effusion taking up about 2/3 of the left lung. CHEST - 2 VIEW  Comparison: 11/10/2012  Findings: A moderate-to-large left pleural effusion, slightly increased since prior study. Left lower lobe atelectasis or infiltrate. Small right pleural effusion with right basilar opacity as well. Airspace disease slightly increased since prior study. Degenerative changes in the thoracic spine.  IMPRESSION: Moderate to large left pleural effusion, increased since prior study. Small right pleural effusion is stable. Bibasilar atelectasis or infiltrate.   Assessment and Plan: Shortness of breath - Plan: DG Chest 2 View  Cynthia Cameron is here today with worsening left pleural effusion and SOB, as well as mild hypotension.  She is not improving and feels as through she is getting worse again.  Called and discussed with the family practice resident on call and will have her proceed to the hospital to be readmitted.  She declined EMS transport and will have her husband drive her to the hospital today.  She is not  tachycardic and is stable for private vehicle transport  Signed Abbe Amsterdam, MD

## 2012-11-18 NOTE — Patient Instructions (Addendum)
Please proceed to admitting at [facility]- you will be admitted back to the hospital.  You will be admitted to the family practice service.

## 2012-11-18 NOTE — CV Procedure (Signed)
Central Venous Catheter Insertion Procedure Note Cynthia Cameron [id number] 09/06/1960  Procedure: Insertion of Central Venous Catheter Indications: Drug and/or fluid administration  Procedure Details Consent: Risks of procedure as well as the alternatives and risks of each were explained to the (patient/caregiver).  Consent for procedure obtained. Time Out: Verified patient identification, verified procedure, site/side was marked, verified correct patient position, special equipment/implants available, medications/allergies/relevent history reviewed, required imaging and test results available.  Performed  Maximum sterile technique was used including antiseptics, gloves, hand hygiene and sheet. Skin prep: Chlorhexidine; local anesthetic administered A antimicrobial bonded/coated triple lumen catheter was placed in the right internal jugular vein using the Seldinger technique.  Evaluation Blood flow good Complications: No apparent complications Patient did tolerate procedure well. Chest X-ray ordered to verify placement.  CXR: pending.  Cynthia Cameron S 11/18/2012, 11:28 PM

## 2012-11-18 NOTE — H&P (Signed)
Cynthia Cameron is an 52 y.o. female.   Chief Complaint: Left sided chest pain and shortness of breath HPI: 52 years old female with recent prolonged hospitalization from pneumonia, PE, DVT has recurrent left sided chest pain. CXR done today shows worsening of left sided pleural effusion. Some cough. No fever.  Past Medical History  Diagnosis Date  . Allergy   . Depression   . Diabetes mellitus without complication   . Hypertension   . Pneumonia 10/2012      Past Surgical History  Procedure Laterality Date  . Cesarean section    . Cardiac catheterization  2014    Family History  Problem Relation Age of Onset  . Hypertension Father   . Diabetes Mother   . Atrial fibrillation Mother    Social History:  reports that she has never smoked. She has never used smokeless tobacco. She reports that  drinks alcohol. She reports that she does not use illicit drugs.  Allergies: No Known Allergies  Medications Prior to Admission  Medication Sig Dispense Refill  . Alogliptin-Metformin HCl (KAZANO) 12.12-998 MG TABS Take 1 tablet by mouth 2 (two) times daily.      Marland Kitchen buPROPion (WELLBUTRIN) 100 MG tablet Take 50 mg by mouth daily.       . chlorpheniramine-HYDROcodone (TUSSIONEX) 10-8 MG/5ML LQCR Take 5 mLs by mouth every 12 (twelve) hours as needed.  140 mL  0  . cloNIDine (CATAPRES) 0.2 MG tablet Take 0.2 mg by mouth at bedtime.      . cyanocobalamin 1000 MCG tablet Take 2,000 mcg by mouth daily.       Marland Kitchen docusate sodium 100 MG CAPS Take 100 mg by mouth daily as needed.  30 capsule  0  . levofloxacin (LEVAQUIN) 750 MG tablet Take 1 tablet (750 mg total) by mouth daily.  6 tablet  0  . lisinopril (PRINIVIL,ZESTRIL) 20 MG tablet Take 20 mg by mouth daily.      . Multiple Vitamin (MULTIVITAMIN) tablet Take 1 tablet by mouth daily.      . nebivolol (BYSTOLIC) 5 MG tablet Take 5 mg by mouth daily.      . potassium chloride (K-DUR,KLOR-CON) 10 MEQ tablet Take 20 mEq by mouth 2 (two) times daily.        . pravastatin (PRAVACHOL) 40 MG tablet Take 40 mg by mouth daily.      Melene Muller ON 11/23/2012] Rivaroxaban (XARELTO) 15 MG TABS tablet Take 1 tablet (15 mg total) by mouth 2 (two) times daily.  18 tablet  0  . [START ON 12/01/2012] Rivaroxaban (XARELTO) 20 MG TABS Take 1 tablet (20 mg total) by mouth daily.  30 tablet  0    Results for orders placed during the hospital encounter of 11/18/12 (from the past 48 hour(s))  GLUCOSE, CAPILLARY     Status: Abnormal   Collection Time    11/18/12  5:49 PM      Result Value Range   Glucose-Capillary 113 (*) 70 - 99 mg/dL   Dg Chest 2 View  04/07/9146  *RADIOLOGY REPORT*  Clinical Data: Shortness of breath.  CHEST - 2 VIEW  Comparison: 11/10/2012  Findings: A moderate-to-large left pleural effusion, slightly increased since prior study.  Left lower lobe atelectasis or infiltrate.  Small right pleural effusion with right basilar opacity as well.  Airspace disease slightly increased since prior study.  Degenerative changes in the thoracic spine.  IMPRESSION: Moderate to large left pleural effusion, increased since prior study.  Small  right pleural effusion is stable.  Bibasilar atelectasis or infiltrate.   Original Report Authenticated By: Charlett Nose, M.D.     @ROS @ Constitutional: Negative for fever and chills.  HENT: Negative.  Respiratory: Negative for cough, chest tightness, + shortness of breath and wheezing.  Cardiovascular: Positive for chest pain. Negative for palpitations.  Gastrointestinal: Negative for nausea, vomiting and abdominal pain.  Musculoskeletal: Negative for back pain and arthralgias.  Skin: Negative.  Neurological: Negative for dizziness, syncope, light-headedness and headaches.  Blood pressure 111/72, pulse 100, temperature 98.1 F (36.7 C), temperature source Oral, resp. rate 20, height 5\' 4"  (1.626 m), weight 77 kg (169 lb 12.1 oz), last menstrual period 09/16/2010, SpO2 95.00%.  HENT: Head: Normocephalic and atraumatic.  Eyes: [place]on Passey, Conjunctivae are normal. No scleral icterus.  Neck: supple. FROM  Cardiovascular: Normal rate, regular rhythm and normal heart sounds. Exam reveals no gallop and no friction rub. II/VI systolic murmur heard.  Pulmonary/Chest: Decreased air entry at bases. She has mild wheezes.  Abdominal: Soft. Bowel sounds are normal. There is no tenderness.  Neurological: She is alert and oriented to person, place, and time. Moves all 4 extremities.  Skin: Skin is warm and dry.  Psychiatric: She appears anxious.   Assessment/Plan Chest pain Recent Pulmonary embolism Bilateral Pleural effusion, Left larger than before. Recent Left leg DVT Possible pneumonia. Hypertension CKD, II Anxiety DM, II  Admit/O2/Pulmonary consult  Holy [place] S 11/18/2012, 6:20 PM

## 2012-11-19 ENCOUNTER — Encounter (HOSPITAL_COMMUNITY): Payer: Self-pay

## 2012-11-19 LAB — CBC
MCH: 26.1 pg (ref 26.0–34.0)
MCHC: 33.8 g/dL (ref 30.0–36.0)
MCV: 77.3 fL — ABNORMAL LOW (ref 78.0–100.0)
Platelets: 429 10*3/uL — ABNORMAL HIGH (ref 150–400)
RBC: 3.52 MIL/uL — ABNORMAL LOW (ref 3.87–5.11)
RDW: 15.2 % (ref 11.5–15.5)

## 2012-11-19 LAB — BASIC METABOLIC PANEL
BUN: 36 mg/dL — ABNORMAL HIGH (ref 6–23)
CO2: 24 mEq/L (ref 19–32)
Calcium: 9.6 mg/dL (ref 8.4–10.5)
Creatinine, Ser: 1.79 mg/dL — ABNORMAL HIGH (ref 0.50–1.10)
Glucose, Bld: 98 mg/dL (ref 70–99)

## 2012-11-19 LAB — GLUCOSE, CAPILLARY: Glucose-Capillary: 111 mg/dL — ABNORMAL HIGH (ref 70–99)

## 2012-11-19 MED ORDER — BIOTENE DRY MOUTH MT LIQD
15.0000 mL | Freq: Two times a day (BID) | OROMUCOSAL | Status: DC
Start: 2012-11-19 — End: 2012-11-28
  Administered 2012-11-20 – 2012-11-28 (×13): 15 mL via OROMUCOSAL

## 2012-11-19 MED ORDER — MUSCLE RUB 10-15 % EX CREA
TOPICAL_CREAM | CUTANEOUS | Status: DC | PRN
  Administered 2012-11-19: 21:00:00 via TOPICAL
  Filled 2012-11-19: qty 85

## 2012-11-19 MED ORDER — HYDROCODONE-ACETAMINOPHEN 5-325 MG PO TABS
1.0000 | ORAL_TABLET | Freq: Four times a day (QID) | ORAL | Status: DC | PRN
  Administered 2012-11-19 – 2012-11-28 (×28): 1 via ORAL
  Filled 2012-11-19 (×29): qty 1

## 2012-11-19 NOTE — Progress Notes (Signed)
PHARMACIST - PHYSICIAN COMMUNICATION  CONCERNING:  METFORMIN SAFE ADMINISTRATION POLICY  RECOMMENDATION: Metformin has been placed on DISCONTINUE (rejected order) STATUS and should be reordered only after any of the conditions below are ruled out.  Current safety recommendations include avoiding metformin for a minimum of 48 hours after the patient's exposure to intravenous contrast media.  DESCRIPTION:  The Pharmacy Committee has adopted a policy that restricts the use of metformin in hospitalized patients until all the contraindications to administration have been ruled out. Specific contraindications are: [] Serum creatinine ? 1.5 for males [x] Serum creatinine ? 1.4 for females [] Shock, acute MI, sepsis, hypoxemia, dehydration [] Planned administration of intravenous iodinated contrast media [] Heart Failure patients with low EF [] Acute or chronic metabolic acidosis (including DKA)      

## 2012-11-19 NOTE — Progress Notes (Signed)
  Echocardiogram 2D Echocardiogram (limited) has been performed.  Cynthia Cameron 11/19/2012, 4:12 PM

## 2012-11-19 NOTE — Progress Notes (Signed)
Referral received today to meet with the pt to give her information on Advance Directives and Social Security Disability information. Met with pt to discuss where to go to start the process of applying for disability. Pt lives in [place] and she will make an appointment to apply.  Pt has great family support, with a husband and 3 grown sons that live in the area.  No other CSW needs at this time.   Sherald Barge, LCSW-A Clinical Social Worker [phone]

## 2012-11-19 NOTE — Progress Notes (Signed)
Subjective:  Decreased shortness of breath with oxygen. Discussed care with Interventional radiology for thoracentesis for large left pleural effusion. Afebrile.  Objective:  Vital Signs in the last 24 hours: Temp:  [98.1 F (36.7 C)-98.7 F (37.1 C)] 98.6 F (37 C) (03/25 0626) Pulse Rate:  [80-102] 97 (03/25 0626) Cardiac Rhythm:  [-] Normal sinus rhythm (03/25 0741) Resp:  [16-20] 18 (03/25 0626) BP: (90-125)/(60-81) 108/73 mmHg (03/25 0626) SpO2:  [94 %-97 %] 96 % (03/25 0626) Weight:  [76.386 kg (168 lb 6.4 oz)-77.111 kg (170 lb)] 76.386 kg (168 lb 6.4 oz) (03/25 0626)  Physical Exam: BP Readings from Last 1 Encounters:  11/19/12 108/73     Wt Readings from Last 1 Encounters:  11/19/12 76.386 kg (168 lb 6.4 oz)    Weight change:   HEENT: [place]/AT, Eyes-Brown, PERL, EOMI, Conjunctiva-Pink, Sclera-Non-icteric Neck: No JVD, No bruit, Trachea midline. Lungs: Clear but no breath sound on left mid and lower lung areas. Cardiac: Regular rhythm, normal S1 and S2, no S3.  Abdomen: Soft, non-tender. Extremities:  No edema present. No cyanosis. No clubbing. CNS: AxOx3, Cranial nerves grossly intact, moves all 4 extremities. Right handed. Skin: Warm and dry.   Intake/Output from previous day:      Lab Results: BMET    Component Value Date/Time   NA 135 11/19/2012 0450   K 4.8 11/19/2012 0450   CL 101 11/19/2012 0450   CO2 24 11/19/2012 0450   GLUCOSE 98 11/19/2012 0450   BUN 36* 11/19/2012 0450   CREATININE 1.79* 11/19/2012 0450   CREATININE 1.50* 01/29/2012 1616   CREATININE 1.50* 01/29/2012 1616   CALCIUM 9.6 11/19/2012 0450   GFRNONAA 31* 11/19/2012 0450   GFRAA 36* 11/19/2012 0450   CBC    Component Value Date/Time   WBC 11.0* 11/19/2012 0450   WBC 12.9* 11/03/2012 1723   RBC 3.52* 11/19/2012 0450   RBC 4.40 11/03/2012 1723   HGB 9.2* 11/19/2012 0450   HGB 11.6* 11/03/2012 1723   HCT 27.2* 11/19/2012 0450   HCT 36.8* 11/03/2012 1723   PLT 429* 11/19/2012 0450   MCV 77.3* 11/19/2012  0450   MCV 83.6 11/03/2012 1723   MCH 26.1 11/19/2012 0450   MCH 26.4* 11/03/2012 1723   MCHC 33.8 11/19/2012 0450   MCHC 31.5* 11/03/2012 1723   RDW 15.2 11/19/2012 0450   LYMPHSABS 2.3 11/18/2012 2040   MONOABS 1.3* 11/18/2012 2040   EOSABS 0.1 11/18/2012 2040   BASOSABS 0.0 11/18/2012 2040   CARDIAC ENZYMES Lab Results  Component Value Date   TROPONINI <0.30 11/03/2012    Scheduled Meds: . antiseptic oral rinse  15 mL Mouth Rinse BID  . buPROPion  50 mg Oral Daily  . cefTRIAXone (ROCEPHIN)  IV  1 g Intravenous Q24H  . cloNIDine  0.2 mg Oral QHS  . linagliptin  5 mg Oral Daily  . lisinopril  20 mg Oral Daily  . nebivolol  5 mg Oral Daily  . OLANZapine  2.5 mg Oral QHS  . potassium chloride  20 mEq Oral BID  . simvastatin  10 mg Oral q1800  . sodium chloride  3 mL Intravenous Q12H  . sodium chloride  3 mL Intravenous Q12H   Continuous Infusions:  PRN Meds:.sodium chloride, chlorpheniramine-HYDROcodone, docusate sodium, HYDROcodone-acetaminophen, ondansetron, sodium chloride  Assessment/Plan: Chest pain  Recent Pulmonary embolism  Bilateral Pleural effusion, Left larger than before.  Recent Left leg DVT  Possible pneumonia.  Hypertension  CKD, II  Anxiety  DM, II  Continue O2 by Nasal cannula. Thoracentesis post INR less than 2.0   LOS: 1 day    Orpah Cobb  MD  11/19/2012, 8:11 AM

## 2012-11-20 ENCOUNTER — Inpatient Hospital Stay (HOSPITAL_COMMUNITY): Payer: 59

## 2012-11-20 LAB — BASIC METABOLIC PANEL
BUN: 28 mg/dL — ABNORMAL HIGH (ref 6–23)
Calcium: 9.2 mg/dL (ref 8.4–10.5)
GFR calc non Af Amer: 42 mL/min — ABNORMAL LOW (ref >90)
Glucose, Bld: 115 mg/dL — ABNORMAL HIGH (ref 70–99)
Potassium: 4.4 mEq/L (ref 3.5–5.1)

## 2012-11-20 LAB — CBC
HCT: 29 % — ABNORMAL LOW (ref 36.0–46.0)
Hemoglobin: 9.8 g/dL — ABNORMAL LOW (ref 12.0–15.0)
MCH: 26.5 pg (ref 26.0–34.0)
MCHC: 33.8 g/dL (ref 30.0–36.0)
MCV: 78.4 fL (ref 78.0–100.0)

## 2012-11-20 LAB — BODY FLUID CELL COUNT WITH DIFFERENTIAL: Neutrophil Count, Fluid: 56 % — ABNORMAL HIGH (ref 0–25)

## 2012-11-20 LAB — GLUCOSE, CAPILLARY
Glucose-Capillary: 128 mg/dL — ABNORMAL HIGH (ref 70–99)
Glucose-Capillary: 130 mg/dL — ABNORMAL HIGH (ref 70–99)

## 2012-11-20 LAB — LACTATE DEHYDROGENASE, PLEURAL OR PERITONEAL FLUID

## 2012-11-20 MED ORDER — RIVAROXABAN 20 MG PO TABS: 20.0000 mg | ORAL_TABLET | Freq: Every day | ORAL | Status: DC

## 2012-11-20 MED ORDER — SODIUM CHLORIDE 0.9 % IJ SOLN
10.0000 mL | INTRAMUSCULAR | Status: DC | PRN
  Administered 2012-11-20 (×2): 10 mL
  Administered 2012-11-21: 20 mL
  Administered 2012-11-21: 10 mL
  Administered 2012-11-21 – 2012-11-22 (×2): 20 mL
  Administered 2012-11-22 – 2012-11-24 (×6): 10 mL
  Administered 2012-11-25: 20 mL
  Administered 2012-11-25 – 2012-11-28 (×4): 10 mL

## 2012-11-20 MED ORDER — RIVAROXABAN 15 MG PO TABS
15.0000 mg | ORAL_TABLET | Freq: Two times a day (BID) | ORAL | Status: DC
  Filled 2012-11-20: qty 1

## 2012-11-20 MED ORDER — RIVAROXABAN 15 MG PO TABS
15.0000 mg | ORAL_TABLET | Freq: Two times a day (BID) | ORAL | Status: DC
  Administered 2012-11-21 – 2012-11-28 (×16): 15 mg via ORAL
  Filled 2012-11-20 (×21): qty 1

## 2012-11-20 NOTE — Procedures (Signed)
US guided diagnostic/therapeutic left thoracentesis performed yielding 900 cc's turbid,amber fluid. The fluid was sent to the lab for preordered studies. F/u CXR pending. No immediate complications.

## 2012-11-20 NOTE — Progress Notes (Signed)
Subjective:  Feeling better post 900 cc of fluid removed by Thoracentesis  Objective:  Vital Signs in the last 24 hours: Temp:  [97.5 F (36.4 C)-98.9 F (37.2 C)] 98.7 F (37.1 C) (03/26 0616) Pulse Rate:  [97-103] 103 (03/26 0616) Cardiac Rhythm:  [-] Normal sinus rhythm (03/26 0808) Resp:  [17] 17 (03/26 0616) BP: (106-129)/(76-87) 117/78 mmHg (03/26 1125) SpO2:  [96 %-100 %] 96 % (03/26 0616) Weight:  [76.114 kg (167 lb 12.8 oz)] 76.114 kg (167 lb 12.8 oz) (03/26 0616)  Physical Exam: BP Readings from Last 1 Encounters:  11/20/12 117/78     Wt Readings from Last 1 Encounters:  11/20/12 76.114 kg (167 lb 12.8 oz)    Weight change: -0.886 kg (-1 lb 15.3 oz)  HEENT: [place]/AT, Eyes-Brown, PERL, EOMI, Conjunctiva-Pink, Sclera-Non-icteric Neck: No JVD, No bruit, Trachea midline. Lungs:  Clearing, Bilateral. Left base decrease air entry. Cardiac:  Regular rhythm, normal S1 and S2, no S3.  Abdomen:  Soft, non-tender. Extremities:  No edema present. No cyanosis. No clubbing. CNS: AxOx3, Cranial nerves grossly intact, moves all 4 extremities. Right handed. Skin: Warm and dry.   Intake/Output from previous day: 03/25 0701 - 03/26 0700 In: 1080 [P.O.:1080] Out: -     Lab Results: BMET    Component Value Date/Time   NA 135 11/20/2012 0600   K 4.4 11/20/2012 0600   CL 100 11/20/2012 0600   CO2 24 11/20/2012 0600   GLUCOSE 115* 11/20/2012 0600   BUN 28* 11/20/2012 0600   CREATININE 1.41* 11/20/2012 0600   CREATININE 1.50* 01/29/2012 1616   CREATININE 1.50* 01/29/2012 1616   CALCIUM 9.2 11/20/2012 0600   GFRNONAA 42* 11/20/2012 0600   GFRAA 49* 11/20/2012 0600   CBC    Component Value Date/Time   WBC 14.4* 11/20/2012 0600   WBC 12.9* 11/03/2012 1723   RBC 3.70* 11/20/2012 0600   RBC 4.40 11/03/2012 1723   HGB 9.8* 11/20/2012 0600   HGB 11.6* 11/03/2012 1723   HCT 29.0* 11/20/2012 0600   HCT 36.8* 11/03/2012 1723   PLT 521* 11/20/2012 0600   MCV 78.4 11/20/2012 0600   MCV 83.6 11/03/2012 1723    MCH 26.5 11/20/2012 0600   MCH 26.4* 11/03/2012 1723   MCHC 33.8 11/20/2012 0600   MCHC 31.5* 11/03/2012 1723   RDW 14.8 11/20/2012 0600   LYMPHSABS 2.3 11/18/2012 2040   MONOABS 1.3* 11/18/2012 2040   EOSABS 0.1 11/18/2012 2040   BASOSABS 0.0 11/18/2012 2040   CARDIAC ENZYMES Lab Results  Component Value Date   TROPONINI <0.30 11/03/2012    Scheduled Meds: . antiseptic oral rinse  15 mL Mouth Rinse BID  . buPROPion  50 mg Oral Daily  . cefTRIAXone (ROCEPHIN)  IV  1 g Intravenous Q24H  . cloNIDine  0.2 mg Oral QHS  . linagliptin  5 mg Oral Daily  . lisinopril  20 mg Oral Daily  . nebivolol  5 mg Oral Daily  . OLANZapine  2.5 mg Oral QHS  . potassium chloride  20 mEq Oral BID  . simvastatin  10 mg Oral q1800  . sodium chloride  3 mL Intravenous Q12H  . sodium chloride  3 mL Intravenous Q12H   Continuous Infusions:  PRN Meds:.sodium chloride, chlorpheniramine-HYDROcodone, docusate sodium, HYDROcodone-acetaminophen, MUSCLE RUB, ondansetron, sodium chloride  Assessment/Plan: Chest pain  Recent Pulmonary embolism  Bilateral Pleural effusion, Left larger than before.  Recent Left leg DVT  Possible pneumonia.  Hypertension  CKD, II  Anxiety  DM,  II  Resume medical treatment.   LOS: 2 days    Orpah Cobb  MD  11/20/2012, 12:56 PM

## 2012-11-21 ENCOUNTER — Inpatient Hospital Stay (HOSPITAL_COMMUNITY): Payer: 59

## 2012-11-21 LAB — CBC
HCT: 28.9 % — ABNORMAL LOW (ref 36.0–46.0)
MCH: 25.8 pg — ABNORMAL LOW (ref 26.0–34.0)
MCV: 79.2 fL (ref 78.0–100.0)
Platelets: 473 10*3/uL — ABNORMAL HIGH (ref 150–400)
RDW: 14.9 % (ref 11.5–15.5)

## 2012-11-21 LAB — BASIC METABOLIC PANEL
BUN: 23 mg/dL (ref 6–23)
Calcium: 9.2 mg/dL (ref 8.4–10.5)
Chloride: 99 mEq/L (ref 96–112)
Creatinine, Ser: 1.22 mg/dL — ABNORMAL HIGH (ref 0.50–1.10)
GFR calc Af Amer: 58 mL/min — ABNORMAL LOW (ref >90)

## 2012-11-21 LAB — GLUCOSE, CAPILLARY

## 2012-11-21 MED ORDER — FUROSEMIDE 10 MG/ML IJ SOLN
40.0000 mg | Freq: Two times a day (BID) | INTRAMUSCULAR | Status: DC
  Administered 2012-11-21 – 2012-11-27 (×13): 40 mg via INTRAVENOUS
  Filled 2012-11-21 (×16): qty 4

## 2012-11-21 MED ORDER — MORPHINE SULFATE 2 MG/ML IJ SOLN
1.0000 mg | Freq: Once | INTRAMUSCULAR | Status: AC
  Administered 2012-11-21: 1 mg via INTRAVENOUS
  Filled 2012-11-21: qty 1

## 2012-11-21 NOTE — Progress Notes (Signed)
Subjective:  Feeling same . Some left sided sharp chest pain. Afebrile.  Objective:  Vital Signs in the last 24 hours: Temp:  [98.1 F (36.7 C)] 98.1 F (36.7 C) (03/27 0607) Pulse Rate:  [95-104] 95 (03/27 1350) Cardiac Rhythm:  [-] Normal sinus rhythm (03/27 0752) Resp:  [20] 20 (03/27 0607) BP: (115-136)/(77-91) 136/87 mmHg (03/27 1350) SpO2:  [98 %] 98 % (03/27 0607) Weight:  [79.47 kg (175 lb 3.2 oz)] 79.47 kg (175 lb 3.2 oz) (03/27 1610)  Physical Exam: BP Readings from Last 1 Encounters:  11/21/12 136/87     Wt Readings from Last 1 Encounters:  11/21/12 79.47 kg (175 lb 3.2 oz)    Weight change: 3.357 kg (7 lb 6.4 oz)  HEENT: [place]/AT, Eyes-Brown, PERL, EOMI, Conjunctiva-Pink, Sclera-Non-icteric Neck: No JVD, No bruit, Trachea midline. Lungs:  Clearing, decrease left lower lung air entry. Cardiac:  Regular rhythm, normal S1 and S2, no S3.  Abdomen:  Soft, non-tender. Extremities:  Trace lower leg edema present. No cyanosis. No clubbing. CNS: AxOx3, Cranial nerves grossly intact, moves all 4 extremities. Right handed. Skin: Warm and dry.   Intake/Output from previous day: 03/26 0701 - 03/27 0700 In: 480 [P.O.:480] Out: -     Lab Results: BMET    Component Value Date/Time   NA 132* 11/21/2012 0500   K 4.3 11/21/2012 0500   CL 99 11/21/2012 0500   CO2 21 11/21/2012 0500   GLUCOSE 166* 11/21/2012 0500   BUN 23 11/21/2012 0500   CREATININE 1.22* 11/21/2012 0500   CREATININE 1.50* 01/29/2012 1616   CREATININE 1.50* 01/29/2012 1616   CALCIUM 9.2 11/21/2012 0500   GFRNONAA 50* 11/21/2012 0500   GFRAA 58* 11/21/2012 0500   CBC    Component Value Date/Time   WBC 13.4* 11/21/2012 0500   WBC 12.9* 11/03/2012 1723   RBC 3.65* 11/21/2012 0500   RBC 4.40 11/03/2012 1723   HGB 9.4* 11/21/2012 0500   HGB 11.6* 11/03/2012 1723   HCT 28.9* 11/21/2012 0500   HCT 36.8* 11/03/2012 1723   PLT 473* 11/21/2012 0500   MCV 79.2 11/21/2012 0500   MCV 83.6 11/03/2012 1723   MCH 25.8* 11/21/2012 0500   MCH 26.4* 11/03/2012 1723   MCHC 32.5 11/21/2012 0500   MCHC 31.5* 11/03/2012 1723   RDW 14.9 11/21/2012 0500   LYMPHSABS 2.3 11/18/2012 2040   MONOABS 1.3* 11/18/2012 2040   EOSABS 0.1 11/18/2012 2040   BASOSABS 0.0 11/18/2012 2040   CARDIAC ENZYMES Lab Results  Component Value Date   TROPONINI <0.30 11/03/2012    Scheduled Meds: . antiseptic oral rinse  15 mL Mouth Rinse BID  . buPROPion  50 mg Oral Daily  . cefTRIAXone (ROCEPHIN)  IV  1 g Intravenous Q24H  . cloNIDine  0.2 mg Oral QHS  . furosemide  40 mg Intravenous Q12H  . linagliptin  5 mg Oral Daily  . lisinopril  20 mg Oral Daily  . nebivolol  5 mg Oral Daily  . OLANZapine  2.5 mg Oral QHS  . potassium chloride  20 mEq Oral BID  . [START ON 12/01/2012] rivaroxaban  20 mg Oral Q supper  . rivaroxaban  15 mg Oral BID WC  . simvastatin  10 mg Oral q1800  . sodium chloride  3 mL Intravenous Q12H  . sodium chloride  3 mL Intravenous Q12H   Continuous Infusions:  PRN Meds:.sodium chloride, chlorpheniramine-HYDROcodone, docusate sodium, HYDROcodone-acetaminophen, MUSCLE RUB, ondansetron, sodium chloride, sodium chloride  Assessment/Plan: Chest pain  Recent Pulmonary embolism  Bilateral Pleural effusion, Left larger than before.  Recent Left leg DVT  Possible pneumonia.  Hypertension  CKD, II  Anxiety  DM, II  Resume lasix.    LOS: 3 days    Orpah Cobb  MD  11/21/2012, 2:11 PM

## 2012-11-22 LAB — BASIC METABOLIC PANEL
BUN: 17 mg/dL (ref 6–23)
CO2: 23 mEq/L (ref 19–32)
Calcium: 9.5 mg/dL (ref 8.4–10.5)
Chloride: 99 mEq/L (ref 96–112)
Creatinine, Ser: 1.15 mg/dL — ABNORMAL HIGH (ref 0.50–1.10)
Glucose, Bld: 129 mg/dL — ABNORMAL HIGH (ref 70–99)

## 2012-11-22 LAB — GLUCOSE, CAPILLARY
Glucose-Capillary: 136 mg/dL — ABNORMAL HIGH (ref 70–99)
Glucose-Capillary: 162 mg/dL — ABNORMAL HIGH (ref 70–99)

## 2012-11-22 LAB — CBC
HCT: 27.9 % — ABNORMAL LOW (ref 36.0–46.0)
MCH: 25.8 pg — ABNORMAL LOW (ref 26.0–34.0)
MCV: 76.6 fL — ABNORMAL LOW (ref 78.0–100.0)
Platelets: 470 10*3/uL — ABNORMAL HIGH (ref 150–400)
RBC: 3.64 MIL/uL — ABNORMAL LOW (ref 3.87–5.11)
WBC: 12.1 10*3/uL — ABNORMAL HIGH (ref 4.0–10.5)

## 2012-11-22 NOTE — Progress Notes (Signed)
Subjective:  Feeling better. Drinking little extra fluid.  Objective:  Vital Signs in the last 24 hours: Temp:  [97.9 F (36.6 C)-98.8 F (37.1 C)] 98.5 F (36.9 C) (03/28 2109) Pulse Rate:  [88-110] 105 (03/28 2109) Cardiac Rhythm:  [-] Normal sinus rhythm (03/28 1945) Resp:  [18-20] 18 (03/28 2109) BP: (112-149)/(82-99) 137/93 mmHg (03/28 2109) SpO2:  [95 %-100 %] 95 % (03/28 2109) Weight:  [76.613 kg (168 lb 14.4 oz)] 76.613 kg (168 lb 14.4 oz) (03/28 0500)  Physical Exam: BP Readings from Last 1 Encounters:  11/22/12 137/93     Wt Readings from Last 1 Encounters:  11/22/12 76.613 kg (168 lb 14.4 oz)    Weight change: -2.858 kg (-6 lb 4.8 oz)  HEENT: [place]/AT, Eyes-Brown, PERL, EOMI, Conjunctiva-Pink, Sclera-Non-icteric Neck: No JVD, No bruit, Trachea midline. Lungs:  Clearing, Bilateral. Decreased breath sounds left lower lung. Cardiac:  Regular rhythm, normal S1 and S2, no S3.  Abdomen:  Soft, non-tender. Extremities:  Trace edema present. No cyanosis. No clubbing. CNS: AxOx3, Cranial nerves grossly intact, moves all 4 extremities. Right handed. Skin: Warm and dry.   Intake/Output from previous day: 03/27 0701 - 03/28 0700 In: 960 [P.O.:720; I.V.:240] Out: -     Lab Results: BMET    Component Value Date/Time   NA 134* 11/22/2012 0500   K 4.3 11/22/2012 0500   CL 99 11/22/2012 0500   CO2 23 11/22/2012 0500   GLUCOSE 129* 11/22/2012 0500   BUN 17 11/22/2012 0500   CREATININE 1.15* 11/22/2012 0500   CREATININE 1.50* 01/29/2012 1616   CREATININE 1.50* 01/29/2012 1616   CALCIUM 9.5 11/22/2012 0500   GFRNONAA 54* 11/22/2012 0500   GFRAA 62* 11/22/2012 0500   CBC    Component Value Date/Time   WBC 12.1* 11/22/2012 0500   WBC 12.9* 11/03/2012 1723   RBC 3.64* 11/22/2012 0500   RBC 4.40 11/03/2012 1723   HGB 9.4* 11/22/2012 0500   HGB 11.6* 11/03/2012 1723   HCT 27.9* 11/22/2012 0500   HCT 36.8* 11/03/2012 1723   PLT 470* 11/22/2012 0500   MCV 76.6* 11/22/2012 0500   MCV 83.6  11/03/2012 1723   MCH 25.8* 11/22/2012 0500   MCH 26.4* 11/03/2012 1723   MCHC 33.7 11/22/2012 0500   MCHC 31.5* 11/03/2012 1723   RDW 15.0 11/22/2012 0500   LYMPHSABS 2.3 11/18/2012 2040   MONOABS 1.3* 11/18/2012 2040   EOSABS 0.1 11/18/2012 2040   BASOSABS 0.0 11/18/2012 2040   CARDIAC ENZYMES Lab Results  Component Value Date   TROPONINI <0.30 11/03/2012    Scheduled Meds: . antiseptic oral rinse  15 mL Mouth Rinse BID  . buPROPion  50 mg Oral Daily  . cefTRIAXone (ROCEPHIN)  IV  1 g Intravenous Q24H  . cloNIDine  0.2 mg Oral QHS  . furosemide  40 mg Intravenous Q12H  . linagliptin  5 mg Oral Daily  . lisinopril  20 mg Oral Daily  . nebivolol  5 mg Oral Daily  . OLANZapine  2.5 mg Oral QHS  . potassium chloride  20 mEq Oral BID  . [START ON 12/01/2012] rivaroxaban  20 mg Oral Q supper  . rivaroxaban  15 mg Oral BID WC  . simvastatin  10 mg Oral q1800  . sodium chloride  3 mL Intravenous Q12H   Continuous Infusions:  PRN Meds:.sodium chloride, chlorpheniramine-HYDROcodone, docusate sodium, HYDROcodone-acetaminophen, MUSCLE RUB, ondansetron, sodium chloride, sodium chloride  Assessment/Plan: Chest pain  Recent Pulmonary embolism  Bilateral Pleural effusion, Left  larger than before.  Recent Left leg DVT  Possible pneumonia.  Hypertension  CKD, II  Anxiety  DM, II  Advised to avoid extra fluid intake.    LOS: 4 days    Orpah Cobb  MD  11/22/2012, 9:20 PM

## 2012-11-23 LAB — BODY FLUID CULTURE
Culture: NO GROWTH
Special Requests: NORMAL

## 2012-11-23 LAB — GLUCOSE, CAPILLARY: Glucose-Capillary: 116 mg/dL — ABNORMAL HIGH (ref 70–99)

## 2012-11-23 NOTE — Progress Notes (Signed)
Subjective:  No new complaints. Further decrease in shortness of breath.  Objective:  Vital Signs in the last 24 hours: Temp:  [97.9 F (36.6 C)-98.6 F (37 C)] 97.9 F (36.6 C) (03/29 0534) Pulse Rate:  [94-110] 94 (03/29 0534) Cardiac Rhythm:  [-] Normal sinus rhythm;Sinus tachycardia (03/28 1945) Resp:  [18-20] 18 (03/29 0534) BP: (120-137)/(83-97) 120/83 mmHg (03/29 0534) SpO2:  [95 %-99 %] 96 % (03/29 0534) Weight:  [77.52 kg (170 lb 14.4 oz)] 77.52 kg (170 lb 14.4 oz) (03/29 0534)  Physical Exam: BP Readings from Last 1 Encounters:  11/23/12 120/83     Wt Readings from Last 1 Encounters:  11/23/12 77.52 kg (170 lb 14.4 oz)    Weight change: 0.907 kg (2 lb)  HEENT: /AT, Eyes-Brown, PERL, EOMI, Conjunctiva-Pink, Sclera-Non-icteric Neck: No JVD, No bruit, Trachea midline. Lungs:  Clear, Bilateral. Cardiac:  Regular rhythm, normal S1 and S2, no S3.  Abdomen:  Soft, non-tender. Extremities:  No edema present. No cyanosis. No clubbing. CNS: AxOx3, Cranial nerves grossly intact, moves all 4 extremities. Right handed. Skin: Warm and dry.   Intake/Output from previous day: 03/28 0701 - 03/29 0700 In: 1200 [P.O.:1200] Out: 1750 [Urine:1750]    Lab Results: BMET    Component Value Date/Time   NA 134* 11/22/2012 0500   K 4.3 11/22/2012 0500   CL 99 11/22/2012 0500   CO2 23 11/22/2012 0500   GLUCOSE 129* 11/22/2012 0500   BUN 17 11/22/2012 0500   CREATININE 1.15* 11/22/2012 0500   CREATININE 1.50* 01/29/2012 1616   CREATININE 1.50* 01/29/2012 1616   CALCIUM 9.5 11/22/2012 0500   GFRNONAA 54* 11/22/2012 0500   GFRAA 62* 11/22/2012 0500   CBC    Component Value Date/Time   WBC 12.1* 11/22/2012 0500   WBC 12.9* 11/03/2012 1723   RBC 3.64* 11/22/2012 0500   RBC 4.40 11/03/2012 1723   HGB 9.4* 11/22/2012 0500   HGB 11.6* 11/03/2012 1723   HCT 27.9* 11/22/2012 0500   HCT 36.8* 11/03/2012 1723   PLT 470* 11/22/2012 0500   MCV 76.6* 11/22/2012 0500   MCV 83.6 11/03/2012 1723   MCH 25.8*  11/22/2012 0500   MCH 26.4* 11/03/2012 1723   MCHC 33.7 11/22/2012 0500   MCHC 31.5* 11/03/2012 1723   RDW 15.0 11/22/2012 0500   LYMPHSABS 2.3 11/18/2012 2040   MONOABS 1.3* 11/18/2012 2040   EOSABS 0.1 11/18/2012 2040   BASOSABS 0.0 11/18/2012 2040   CARDIAC ENZYMES Lab Results  Component Value Date   TROPONINI <0.30 11/03/2012    Scheduled Meds: . antiseptic oral rinse  15 mL Mouth Rinse BID  . buPROPion  50 mg Oral Daily  . cefTRIAXone (ROCEPHIN)  IV  1 g Intravenous Q24H  . cloNIDine  0.2 mg Oral QHS  . furosemide  40 mg Intravenous Q12H  . linagliptin  5 mg Oral Daily  . lisinopril  20 mg Oral Daily  . nebivolol  5 mg Oral Daily  . OLANZapine  2.5 mg Oral QHS  . potassium chloride  20 mEq Oral BID  . [START ON 12/01/2012] rivaroxaban  20 mg Oral Q supper  . rivaroxaban  15 mg Oral BID WC  . simvastatin  10 mg Oral q1800  . sodium chloride  3 mL Intravenous Q12H   Continuous Infusions:  PRN Meds:.sodium chloride, chlorpheniramine-HYDROcodone, docusate sodium, HYDROcodone-acetaminophen, MUSCLE RUB, ondansetron, sodium chloride, sodium chloride  Assessment/Plan: Chest pain  Recent Pulmonary embolism  Bilateral Pleural effusion, Left larger than before.  Recent  Left leg DVT  Possible pneumonia.  Hypertension  CKD, II  Anxiety  DM, II  Continue activity. CXR in AM.   LOS: 5 days    Orpah Cobb  MD  11/23/2012, 9:48 AM

## 2012-11-24 ENCOUNTER — Inpatient Hospital Stay (HOSPITAL_COMMUNITY): Payer: 59

## 2012-11-24 LAB — CBC
HCT: 25.7 % — ABNORMAL LOW (ref 36.0–46.0)
MCH: 26.4 pg (ref 26.0–34.0)
MCV: 77.9 fL — ABNORMAL LOW (ref 78.0–100.0)
Platelets: 499 10*3/uL — ABNORMAL HIGH (ref 150–400)
RDW: 14.8 % (ref 11.5–15.5)
WBC: 11 10*3/uL — ABNORMAL HIGH (ref 4.0–10.5)

## 2012-11-24 LAB — GLUCOSE, CAPILLARY
Glucose-Capillary: 140 mg/dL — ABNORMAL HIGH (ref 70–99)
Glucose-Capillary: 143 mg/dL — ABNORMAL HIGH (ref 70–99)

## 2012-11-24 LAB — BASIC METABOLIC PANEL
BUN: 21 mg/dL (ref 6–23)
Calcium: 8.7 mg/dL (ref 8.4–10.5)
Chloride: 95 mEq/L — ABNORMAL LOW (ref 96–112)
Creatinine, Ser: 1.19 mg/dL — ABNORMAL HIGH (ref 0.50–1.10)
GFR calc Af Amer: 60 mL/min — ABNORMAL LOW (ref >90)

## 2012-11-24 MED ORDER — POTASSIUM CHLORIDE CRYS ER 20 MEQ PO TBCR
40.0000 meq | EXTENDED_RELEASE_TABLET | Freq: Two times a day (BID) | ORAL | Status: DC
  Administered 2012-11-24 – 2012-11-28 (×8): 40 meq via ORAL
  Filled 2012-11-24 (×9): qty 2

## 2012-11-24 NOTE — Progress Notes (Signed)
Subjective:  Feels better but no significant improvement on chest x-ray. Afebrile.  Objective:  Vital Signs in the last 24 hours: Temp:  [97.7 F (36.5 C)-98.7 F (37.1 C)] 98.4 F (36.9 C) (03/30 0500) Pulse Rate:  [86-90] 86 (03/30 0500) Cardiac Rhythm:  [-] Normal sinus rhythm (03/30 0927) Resp:  [18] 18 (03/30 0500) BP: (117-148)/(73-98) 120/85 mmHg (03/30 0937) SpO2:  [92 %-95 %] 92 % (03/30 0500)  Physical Exam: BP Readings from Last 1 Encounters:  11/24/12 120/85     Wt Readings from Last 1 Encounters:  11/23/12 77.52 kg (170 lb 14.4 oz)    Weight change:   HEENT: [place]/AT, Eyes-Brown, PERL, EOMI, Conjunctiva-Pink, Sclera-Non-icteric Neck: No JVD, No bruit, Trachea midline. Lungs:  Clearing, Bilateral. Dullness over left lower lung area. Cardiac:  Regular rhythm, normal S1 and S2, no S3.  Abdomen:  Soft, non-tender. Extremities:  Trace edema present. No cyanosis. No clubbing. CNS: AxOx3, Cranial nerves grossly intact, moves all 4 extremities. Right handed. Skin: Warm and dry.   Intake/Output from previous day: 03/29 0701 - 03/30 0700 In: 1290 [P.O.:1240; IV Piggyback:50] Out: 2150 [Urine:2150]    Lab Results: BMET    Component Value Date/Time   NA 134* 11/24/2012 0500   K 3.2* 11/24/2012 0500   CL 95* 11/24/2012 0500   CO2 28 11/24/2012 0500   GLUCOSE 178* 11/24/2012 0500   BUN 21 11/24/2012 0500   CREATININE 1.19* 11/24/2012 0500   CREATININE 1.50* 01/29/2012 1616   CREATININE 1.50* 01/29/2012 1616   CALCIUM 8.7 11/24/2012 0500   GFRNONAA 52* 11/24/2012 0500   GFRAA 60* 11/24/2012 0500   CBC    Component Value Date/Time   WBC 11.0* 11/24/2012 0500   WBC 12.9* 11/03/2012 1723   RBC 3.30* 11/24/2012 0500   RBC 4.40 11/03/2012 1723   HGB 8.7* 11/24/2012 0500   HGB 11.6* 11/03/2012 1723   HCT 25.7* 11/24/2012 0500   HCT 36.8* 11/03/2012 1723   PLT 499* 11/24/2012 0500   MCV 77.9* 11/24/2012 0500   MCV 83.6 11/03/2012 1723   MCH 26.4 11/24/2012 0500   MCH 26.4* 11/03/2012 1723    MCHC 33.9 11/24/2012 0500   MCHC 31.5* 11/03/2012 1723   RDW 14.8 11/24/2012 0500   LYMPHSABS 2.3 11/18/2012 2040   MONOABS 1.3* 11/18/2012 2040   EOSABS 0.1 11/18/2012 2040   BASOSABS 0.0 11/18/2012 2040   CARDIAC ENZYMES Lab Results  Component Value Date   TROPONINI <0.30 11/03/2012    Scheduled Meds: . antiseptic oral rinse  15 mL Mouth Rinse BID  . buPROPion  50 mg Oral Daily  . cefTRIAXone (ROCEPHIN)  IV  1 g Intravenous Q24H  . cloNIDine  0.2 mg Oral QHS  . furosemide  40 mg Intravenous Q12H  . linagliptin  5 mg Oral Daily  . lisinopril  20 mg Oral Daily  . nebivolol  5 mg Oral Daily  . OLANZapine  2.5 mg Oral QHS  . potassium chloride  40 mEq Oral BID  . [START ON 12/01/2012] rivaroxaban  20 mg Oral Q supper  . rivaroxaban  15 mg Oral BID WC  . simvastatin  10 mg Oral q1800  . sodium chloride  3 mL Intravenous Q12H   Continuous Infusions:  PRN Meds:.sodium chloride, chlorpheniramine-HYDROcodone, docusate sodium, HYDROcodone-acetaminophen, MUSCLE RUB, ondansetron, sodium chloride, sodium chloride  Assessment/Plan: Chest pain  Recent Pulmonary embolism  Bilateral Pleural effusion, Left larger than before.  Recent Left leg DVT  Possible pneumonia.  Hypertension  CKD, II  Anxiety  DM, II  Add incentive spirometry.   LOS: 6 days    Orpah Cobb  MD  11/24/2012, 1:01 PM

## 2012-11-25 ENCOUNTER — Inpatient Hospital Stay (HOSPITAL_COMMUNITY): Payer: 59

## 2012-11-25 DIAGNOSIS — I2699 Other pulmonary embolism without acute cor pulmonale: Secondary | ICD-10-CM

## 2012-11-25 LAB — CULTURE, BLOOD (ROUTINE X 2): Culture: NO GROWTH

## 2012-11-25 LAB — BASIC METABOLIC PANEL
CO2: 29 mEq/L (ref 19–32)
Calcium: 9.2 mg/dL (ref 8.4–10.5)
Chloride: 99 mEq/L (ref 96–112)
Glucose, Bld: 107 mg/dL — ABNORMAL HIGH (ref 70–99)
Potassium: 4 mEq/L (ref 3.5–5.1)
Sodium: 137 mEq/L (ref 135–145)

## 2012-11-25 LAB — CBC WITH DIFFERENTIAL/PLATELET
Eosinophils Relative: 4 % (ref 0–5)
HCT: 25.7 % — ABNORMAL LOW (ref 36.0–46.0)
Lymphocytes Relative: 14 % (ref 12–46)
Lymphs Abs: 1.4 10*3/uL (ref 0.7–4.0)
MCV: 76.3 fL — ABNORMAL LOW (ref 78.0–100.0)
Neutro Abs: 7.2 10*3/uL (ref 1.7–7.7)
Platelets: 489 10*3/uL — ABNORMAL HIGH (ref 150–400)
RBC: 3.37 MIL/uL — ABNORMAL LOW (ref 3.87–5.11)
WBC: 9.8 10*3/uL (ref 4.0–10.5)

## 2012-11-25 LAB — GLUCOSE, CAPILLARY
Glucose-Capillary: 134 mg/dL — ABNORMAL HIGH (ref 70–99)
Glucose-Capillary: 116 mg/dL — ABNORMAL HIGH (ref 70–99)

## 2012-11-25 LAB — ANTITHROMBIN III: AntiThromb III Func: 92 % (ref 75–120)

## 2012-11-25 MED ORDER — IOHEXOL 300 MG/ML  SOLN
80.0000 mL | Freq: Once | INTRAMUSCULAR | Status: AC | PRN
  Administered 2012-11-25: 80 mL via INTRAVENOUS

## 2012-11-25 MED ORDER — HYDROCODONE-ACETAMINOPHEN 5-325 MG PO TABS
1.0000 | ORAL_TABLET | Freq: Once | ORAL | Status: AC
  Administered 2012-11-25: 1 via ORAL

## 2012-11-25 NOTE — Consult Note (Signed)
PULMONARY  / CRITICAL CARE MEDICINE  Name: Cynthia Cameron MRN: [id number] DOB: 12-14-1960    ADMISSION DATE:  11/18/2012 CONSULTATION DATE:  3/31  REFERRING MD :  Algie Coffer  PRIMARY SERVICE: Algie Coffer   CHIEF COMPLAINT:   Abnormal CXR/ exudative pleural effusion   BRIEF PATIENT DESCRIPTION:  52 year old female s/p recent hospital treated for PE, DVT and PNA (DVT/PE seemingly unprovoked).  Admitted w/ CP on 3/24. Found to have worsening Left effusion-->thora 3/26-->exudate, Cytology and cultures were negative. PCCM asked to see on 3/31 for persistent abnormal cxr and non-diagnostic thora  SIGNIFICANT EVENTS / STUDIES:  Pleural fluid sample 3/26: Pleural cytology: REACTIVE MESOTHELIAL CELLS. THE MESOTHELIAL CELLS ARE POSITIVE FOR CALRETININ, WT-1, AND CYTOKERATIN 5/6. THEY ARE NEGATIVE FOR CEA, MOC-31, P63, AND TTF-1. P. Protein 3/26: 5.1 P. LDH 3/26: 149 P. WBC: 2948 P neut: 56  CT chest 3/31>>>  LINES / TUBES:   CULTURES: Pleural culture 3/31: neg  ANTIBIOTICS:   HISTORY OF PRESENT ILLNESS:   52 year old female s/p recent hospital stay for PE, DVT and PNA (DVT/PE seemingly unprovoked).  Admitted [facility] w/ CP on 3/24. On eval found to have worsening Left effusion, was admitted for further eval. Underwent diagnostic/therapeutic left thora on 3/26. This met exudate status by light's criteria. Cytology and cultures were negative. As of 3/31 her symptoms have improved, but her CXR has remained abnormal. Because of this and non-diagnostic findings of pleural effusion PCCM was asked to eval.  PAST MEDICAL HISTORY :  Past Medical History  Diagnosis Date  . Allergy   . Depression   . Diabetes mellitus without complication   . Hypertension   . Pneumonia 10/2012  . Shortness of breath    Past Surgical History  Procedure Laterality Date  . Cesarean section    . Cardiac catheterization  2014   Prior to Admission medications   Medication Sig Start Date End Date Taking?  Authorizing Provider  Alogliptin-Metformin HCl (KAZANO) 12.12-998 MG TABS Take 1 tablet by mouth 2 (two) times daily.   Yes Historical Provider, MD  buPROPion (WELLBUTRIN) 100 MG tablet Take 50 mg by mouth daily.    Yes Historical Provider, MD  chlorpheniramine-HYDROcodone (TUSSIONEX) 10-8 MG/5ML LQCR Take 5 mLs by mouth 2 (two) times daily as needed (for cough).   Yes Historical Provider, MD  cloNIDine (CATAPRES) 0.2 MG tablet Take 0.2 mg by mouth at bedtime.   Yes Historical Provider, MD  docusate sodium (COLACE) 100 MG capsule Take 100 mg by mouth daily as needed for constipation.   Yes Historical Provider, MD  fexofenadine-pseudoephedrine (ALLEGRA-D 24) 180-240 MG per 24 hr tablet Take 1 tablet by mouth daily.   Yes Historical Provider, MD  lisinopril (PRINIVIL,ZESTRIL) 20 MG tablet Take 20 mg by mouth daily. 12/25/11 12/24/12 Yes Peyton Najjar, MD  Multiple Vitamin (MULTIVITAMIN WITH MINERALS) TABS Take 1 tablet by mouth daily.   Yes Historical Provider, MD  nebivolol (BYSTOLIC) 10 MG tablet Take 10 mg by mouth daily.   Yes Historical Provider, MD  OLANZapine (ZYPREXA) 5 MG tablet Take 5 mg by mouth at bedtime.   Yes Historical Provider, MD  potassium chloride (K-DUR,KLOR-CON) 10 MEQ tablet Take 20 mEq by mouth 2 (two) times daily.   Yes Historical Provider, MD  pravastatin (PRAVACHOL) 40 MG tablet Take 40 mg by mouth daily. 12/11/11 12/10/12 Yes Gwenlyn Found Copland, MD  Rivaroxaban (XARELTO) 15 MG TABS tablet Take 1 tablet (15 mg total) by mouth 2 (two)  times daily. 11/23/12  Yes Bryan R Hess, DO  vitamin B-12 (CYANOCOBALAMIN) 1000 MCG tablet Take 1,000 mcg by mouth daily as needed (for energy).   Yes Historical Provider, MD  Rivaroxaban (XARELTO) 20 MG TABS Take 1 tablet (20 mg total) by mouth daily. 12/01/12   Briscoe Deutscher, DO   No Known Allergies  FAMILY HISTORY:  Family History  Problem Relation Age of Onset  . Hypertension Father   . Diabetes Mother   . Atrial fibrillation Mother     SOCIAL HISTORY:  reports that she has never smoked. She has never used smokeless tobacco. She reports that  drinks alcohol. She reports that she does not use illicit drugs.  REVIEW OF SYSTEMS:   Review of Systems  Constitutional: No weight loss, gain, night sweats, Fevers, chills, fatigue .  HEENT: No headaches, visual changes, Difficulty swallowing, Tooth/dental problems, or Sore throat,  No sneezing, itching, ear ache, nasal congestion, post nasal drip, no visual complaints CV: chest pain, Orthopnea, PND, swelling in lower extremities, dizziness, palpitations, syncope.  GI No heartburn, indigestion, abdominal pain, nausea, vomiting, diarrhea, change in bowel habits, loss of appetite, bloody stools.  Resp: No cough, No coughing up of blood. No change in color of mucus. No wheezing.  Skin: no rash or itching or icterus GU: no dysuria, change in color of urine, no urgency or frequency. No flank pain, no hematuria  MS: No joint pain or swelling. No decreased range of motion  Psych: No change in mood or affect. No depression or anxiety.  Neuro: no difficulty with speech, weakness, numbness, ataxia    SUBJECTIVE:  Feels better but CP starting to come back  VITAL SIGNS: Temp:  [98.2 F (36.8 C)-98.4 F (36.9 C)] 98.2 F (36.8 C) (03/31 0500) Pulse Rate:  [84-96] 84 (03/31 0500) Resp:  [16-20] 18 (03/31 0500) BP: (116-150)/(77-99) 129/90 mmHg (03/31 1005) SpO2:  [91 %-95 %] 95 % (03/31 0500) Weight:  [79.289 kg (174 lb 12.8 oz)] 79.289 kg (174 lb 12.8 oz) (03/31 0500) HEMODYNAMICS:   VENTILATOR SETTINGS:   INTAKE / OUTPUT: Intake/Output     03/30 0701 - 03/31 0700 03/31 0701 - 04/01 0700   P.O. 1200    IV Piggyback     Total Intake(mL/kg) 1200 (15.1)    Urine (mL/kg/hr) 2125 (1.1)    Total Output 2125     Net -925            PHYSICAL EXAMINATION: General:  Well developed AAF no acute distress Neuro:  Awake, oriented, no focal def  HEENT:  [place], no JVD  Cardiovascular:   rrr Lungs:  Decreased in bases  Abdomen:  Soft non tender  Musculoskeletal:  Intact  Skin:  LE edema   LABS:  Recent Labs Lab 11/21/12 0500 11/22/12 0500 11/24/12 0500  NA 132* 134* 134*  K 4.3 4.3 3.2*  CL 99 99 95*  CO2 21 23 28   BUN 23 17 21   CREATININE 1.22* 1.15* 1.19*  GLUCOSE 166* 129* 178*    Recent Labs Lab 11/22/12 0500 11/24/12 0500 11/25/12 1000  HGB 9.4* 8.7* 8.6*  HCT 27.9* 25.7* 25.7*  WBC 12.1* 11.0* 9.8  PLT 470* 499* 489*  ANA 3/11: negative   Erythrocyte Sedimentation Rate     Component Value Date/Time   ESRSEDRATE 85* 11/05/2012 1415    Recent Labs Lab 11/24/12 1121 11/24/12 1701 11/24/12 2044 11/25/12 0744 11/25/12 1204  GLUCAP 152* 143* 181* 134* 153*    CXR: LLL ATX/  vs consolidative infiltrate, sm right effusion, some increase in left effusion   ASSESSMENT / PLAN: 1) recurrent Left effusion w/ pleuritic type CP 2) recent PE/DVT: seemingly unprovoked.  3) recent h/o possible PNA (NOS)  4) LLL ATX/ consolidative changes.   She is s/p left thoracentesis which was exudative by lights criteria. She continues to have an abnormal CXR but symptomatically has improved some. It is troubling that we do not have a clear provoking explanation for her DVT/PE. Seemingly auto-immune/ connective tissue disorder moving up on diff dx as uniting diagnosis for effusion and recent PE. Seems less likely parapneumonic effusion if it is re accumulating. May also consider undiagnosed malignancy. She has had recent mammogram which was neg, states had recent fecal occult blood which was neg.   Plan: Repeat CT chest w/ contrast  Send auto-immune studies.    Antinuclear antibodies (ANA) -->done and neg Antiphospholipid antibodies  Antibodies to double stranded DNA (dsDNA)  serum complement levels C3 and C4 may also be helpful, since hypocomplementemia is a frequent finding in active SLE. Would continue at least 8-10 weeks of anticoagulation and then repeat  CT scan She should keep her GYN appointment w/ Cousins for PAP smear  Make sure she has f/u GI appointment for routine colonoscopy after d/c    Billy Fischer, MD ; [facility] service Mobile [phone].  After 5:30 PM or weekends, call [phone]  Pulmonary and Critical Care Medicine [facility] Pager: [phone]  11/25/2012, 1:33 PM

## 2012-11-25 NOTE — Progress Notes (Signed)
Subjective:  Feeling better. Agrees to second opinion by Pulmonary. Afebrile and ambulating well and using incentive spirometry since yesterday.  Objective:  Vital Signs in the last 24 hours: Temp:  [98.1 F (36.7 C)-98.4 F (36.9 C)] 98.1 F (36.7 C) (03/31 1400) Pulse Rate:  [74-85] 74 (03/31 1400) Cardiac Rhythm:  [-] Normal sinus rhythm (03/31 0748) Resp:  [16-20] 16 (03/31 1400) BP: (116-150)/(77-99) 135/88 mmHg (03/31 1400) SpO2:  [94 %-95 %] 95 % (03/31 1400) Weight:  [79.289 kg (174 lb 12.8 oz)] 79.289 kg (174 lb 12.8 oz) (03/31 0500)  Physical Exam: BP Readings from Last 1 Encounters:  11/25/12 135/88     Wt Readings from Last 1 Encounters:  11/25/12 79.289 kg (174 lb 12.8 oz)    Weight change:   HEENT: [place]/AT, Eyes-Brown, PERL, EOMI, Conjunctiva-Pink, Sclera-Non-icteric Neck: No JVD, No bruit, Trachea midline. Lungs:  Clearing, Bilateral. Left lower lobe dullness persist. Cardiac:  Regular rhythm, normal S1 and S2, no S3.  Abdomen:  Soft, non-tender. Extremities:  Trace edema present. No cyanosis. No clubbing. CNS: AxOx3, Cranial nerves grossly intact, moves all 4 extremities. Right handed. Skin: Warm and dry.   Intake/Output from previous day: 03/30 0701 - 03/31 0700 In: 1200 [P.O.:1200] Out: 2125 [Urine:2125]    Lab Results: BMET    Component Value Date/Time   NA 137 11/25/2012 1000   K 4.0 11/25/2012 1000   CL 99 11/25/2012 1000   CO2 29 11/25/2012 1000   GLUCOSE 107* 11/25/2012 1000   BUN 17 11/25/2012 1000   CREATININE 1.24* 11/25/2012 1000   CREATININE 1.50* 01/29/2012 1616   CREATININE 1.50* 01/29/2012 1616   CALCIUM 9.2 11/25/2012 1000   GFRNONAA 49* 11/25/2012 1000   GFRAA 57* 11/25/2012 1000   CBC    Component Value Date/Time   WBC 9.8 11/25/2012 1000   WBC 12.9* 11/03/2012 1723   RBC 3.37* 11/25/2012 1000   RBC 4.40 11/03/2012 1723   HGB 8.6* 11/25/2012 1000   HGB 11.6* 11/03/2012 1723   HCT 25.7* 11/25/2012 1000   HCT 36.8* 11/03/2012 1723   PLT 489*  11/25/2012 1000   MCV 76.3* 11/25/2012 1000   MCV 83.6 11/03/2012 1723   MCH 25.5* 11/25/2012 1000   MCH 26.4* 11/03/2012 1723   MCHC 33.5 11/25/2012 1000   MCHC 31.5* 11/03/2012 1723   RDW 14.9 11/25/2012 1000   LYMPHSABS 1.4 11/25/2012 1000   MONOABS 0.8 11/25/2012 1000   EOSABS 0.4 11/25/2012 1000   BASOSABS 0.0 11/25/2012 1000   CARDIAC ENZYMES Lab Results  Component Value Date   TROPONINI <0.30 11/03/2012    Scheduled Meds: . antiseptic oral rinse  15 mL Mouth Rinse BID  . buPROPion  50 mg Oral Daily  . cefTRIAXone (ROCEPHIN)  IV  1 g Intravenous Q24H  . cloNIDine  0.2 mg Oral QHS  . furosemide  40 mg Intravenous Q12H  . linagliptin  5 mg Oral Daily  . lisinopril  20 mg Oral Daily  . nebivolol  5 mg Oral Daily  . OLANZapine  2.5 mg Oral QHS  . potassium chloride  40 mEq Oral BID  . [START ON 12/01/2012] rivaroxaban  20 mg Oral Q supper  . rivaroxaban  15 mg Oral BID WC  . simvastatin  10 mg Oral q1800  . sodium chloride  3 mL Intravenous Q12H   Continuous Infusions:  PRN Meds:.sodium chloride, chlorpheniramine-HYDROcodone, docusate sodium, HYDROcodone-acetaminophen, iohexol, MUSCLE RUB, ondansetron, sodium chloride, sodium chloride  Assessment/Plan: Chest pain  Recent Pulmonary  embolism  Bilateral Pleural effusion, Left larger than before.  Recent Left leg DVT  Possible pneumonia.  Hypertension  CKD, II  Anxiety  DM, II  Continue medical treatment. Follow with pulmonary.   LOS: 7 days    Cynthia Cobb  MD  11/25/2012, 7:19 PM

## 2012-11-26 DIAGNOSIS — R0989 Other specified symptoms and signs involving the circulatory and respiratory systems: Secondary | ICD-10-CM

## 2012-11-26 DIAGNOSIS — J90 Pleural effusion, not elsewhere classified: Principal | ICD-10-CM

## 2012-11-26 DIAGNOSIS — J189 Pneumonia, unspecified organism: Secondary | ICD-10-CM

## 2012-11-26 DIAGNOSIS — Z0271 Encounter for disability determination: Secondary | ICD-10-CM

## 2012-11-26 DIAGNOSIS — R0609 Other forms of dyspnea: Secondary | ICD-10-CM

## 2012-11-26 LAB — PROTEIN C ACTIVITY: Protein C Activity: 127 % (ref 75–133)

## 2012-11-26 LAB — GLUCOSE, CAPILLARY
Glucose-Capillary: 129 mg/dL — ABNORMAL HIGH (ref 70–99)
Glucose-Capillary: 165 mg/dL — ABNORMAL HIGH (ref 70–99)
Glucose-Capillary: 149 mg/dL — ABNORMAL HIGH (ref 70–99)

## 2012-11-26 LAB — BETA-2-GLYCOPROTEIN I ABS, IGG/M/A
Beta-2 Glyco I IgG: 0 G Units (ref <20)
Beta-2-Glycoprotein I IgA: 0 A Units (ref <20)
Beta-2-Glycoprotein I IgM: 10 M Units (ref <20)

## 2012-11-26 LAB — HOMOCYSTEINE: Homocysteine: 11.1 umol/L (ref 4.0–15.4)

## 2012-11-26 LAB — LUPUS ANTICOAGULANT PANEL
Lupus Anticoagulant: DETECTED — AB
dRVVT Incubated 1:1 Mix: 58.3 secs — ABNORMAL HIGH (ref <42.9)

## 2012-11-26 LAB — C3 COMPLEMENT: C3 Complement: 165 mg/dL (ref 90–180)

## 2012-11-26 LAB — SEDIMENTATION RATE: Sed Rate: 132 mm/hr — ABNORMAL HIGH (ref 0–22)

## 2012-11-26 LAB — PROTEIN S ACTIVITY: Protein S Activity: 162 % — ABNORMAL HIGH (ref 69–129)

## 2012-11-26 LAB — C4 COMPLEMENT: Complement C4, Body Fluid: 41 mg/dL — ABNORMAL HIGH (ref 10–40)

## 2012-11-26 MED ORDER — IBUPROFEN 600 MG PO TABS
600.0000 mg | ORAL_TABLET | Freq: Once | ORAL | Status: AC
  Administered 2012-11-26: 600 mg via ORAL
  Filled 2012-11-26: qty 1

## 2012-11-26 MED ORDER — PREDNISONE 20 MG PO TABS
40.0000 mg | ORAL_TABLET | Freq: Every day | ORAL | Status: DC
  Administered 2012-11-26 – 2012-11-27 (×2): 40 mg via ORAL
  Filled 2012-11-26 (×3): qty 2

## 2012-11-26 NOTE — Progress Notes (Signed)
Subjective:  Slow and steady improvement. Decreasing pleural effusions.   Objective:  Vital Signs in the last 24 hours: Temp:  [98.1 F (36.7 C)-98.8 F (37.1 C)] 98.8 F (37.1 C) (04/01 0500) Pulse Rate:  [74-80] 80 (04/01 0500) Cardiac Rhythm:  [-] Normal sinus rhythm (04/01 0741) Resp:  [16-18] 18 (04/01 0500) BP: (110-142)/(61-89) 114/61 mmHg (04/01 0943) SpO2:  [95 %-99 %] 97 % (04/01 0500) Weight:  [78.5 kg (173 lb 1 oz)] 78.5 kg (173 lb 1 oz) (04/01 0500)  Physical Exam: BP Readings from Last 1 Encounters:  11/26/12 114/61     Wt Readings from Last 1 Encounters:  11/26/12 78.5 kg (173 lb 1 oz)    Weight change: -0.789 kg (-1 lb 11.8 oz)  HEENT: [place]/AT, Eyes-Brown, PERL, EOMI, Conjunctiva-Pink, Sclera-Non-icteric Neck: No JVD, No bruit, Trachea midline. Lungs:  Clearing., Bilateral. Improving air entry with some dullness at bases. Cardiac:  Regular rhythm, normal S1 and S2, no S3.  Abdomen:  Soft, non-tender. Extremities:  Trace edema present. No cyanosis. No clubbing. CNS: AxOx3, Cranial nerves grossly intact, moves all 4 extremities. Right handed. Skin: Warm and dry.   Intake/Output from previous day: 03/31 0701 - 04/01 0700 In: 1010 [P.O.:720; IV Piggyback:50] Out: 550 [Urine:550]    Lab Results: BMET    Component Value Date/Time   NA 137 11/25/2012 1000   K 4.0 11/25/2012 1000   CL 99 11/25/2012 1000   CO2 29 11/25/2012 1000   GLUCOSE 107* 11/25/2012 1000   BUN 17 11/25/2012 1000   CREATININE 1.24* 11/25/2012 1000   CREATININE 1.50* 01/29/2012 1616   CREATININE 1.50* 01/29/2012 1616   CALCIUM 9.2 11/25/2012 1000   GFRNONAA 49* 11/25/2012 1000   GFRAA 57* 11/25/2012 1000   CBC    Component Value Date/Time   WBC 9.8 11/25/2012 1000   WBC 12.9* 11/03/2012 1723   RBC 3.37* 11/25/2012 1000   RBC 4.40 11/03/2012 1723   HGB 8.6* 11/25/2012 1000   HGB 11.6* 11/03/2012 1723   HCT 25.7* 11/25/2012 1000   HCT 36.8* 11/03/2012 1723   PLT 489* 11/25/2012 1000   MCV 76.3*  11/25/2012 1000   MCV 83.6 11/03/2012 1723   MCH 25.5* 11/25/2012 1000   MCH 26.4* 11/03/2012 1723   MCHC 33.5 11/25/2012 1000   MCHC 31.5* 11/03/2012 1723   RDW 14.9 11/25/2012 1000   LYMPHSABS 1.4 11/25/2012 1000   MONOABS 0.8 11/25/2012 1000   EOSABS 0.4 11/25/2012 1000   BASOSABS 0.0 11/25/2012 1000   CARDIAC ENZYMES Lab Results  Component Value Date   TROPONINI <0.30 11/03/2012    Scheduled Meds: . antiseptic oral rinse  15 mL Mouth Rinse BID  . buPROPion  50 mg Oral Daily  . cefTRIAXone (ROCEPHIN)  IV  1 g Intravenous Q24H  . cloNIDine  0.2 mg Oral QHS  . furosemide  40 mg Intravenous Q12H  . linagliptin  5 mg Oral Daily  . lisinopril  20 mg Oral Daily  . nebivolol  5 mg Oral Daily  . OLANZapine  2.5 mg Oral QHS  . potassium chloride  40 mEq Oral BID  . [START ON 12/01/2012] rivaroxaban  20 mg Oral Q supper  . rivaroxaban  15 mg Oral BID WC  . simvastatin  10 mg Oral q1800  . sodium chloride  3 mL Intravenous Q12H   Continuous Infusions:  PRN Meds:.sodium chloride, chlorpheniramine-HYDROcodone, docusate sodium, HYDROcodone-acetaminophen, MUSCLE RUB, ondansetron, sodium chloride, sodium chloride  Assessment/Plan: Chest pain, pleuritic  Recent Pulmonary  embolism  Bilateral Pleural effusion now decreasing Left lung atelectasis.  Recent Left leg DVT  Possible pneumonia.  Hypertension  CKD, II  Anxiety  DM, II  Continue incentive spirometry. Home in AM if stable.   LOS: 8 days    Orpah Cobb  MD  11/26/2012, 10:41 AM

## 2012-11-26 NOTE — Consult Note (Signed)
PULMONARY  / CRITICAL CARE MEDICINE  Name: Cynthia Cameron MRN: [id number] DOB: 1960-12-18    ADMISSION DATE:  11/18/2012 CONSULTATION DATE:  3/31  REFERRING MD :  Algie Coffer  PRIMARY SERVICE: Algie Coffer   CHIEF COMPLAINT:   Abnormal CXR/ exudative pleural effusion   BRIEF PATIENT DESCRIPTION:  52 year old female s/p recent hospital treated for PE, DVT and PNA (DVT/PE seemingly unprovoked).  Admitted w/ CP on 3/24. Found to have worsening Left effusion-->thora 3/26-->exudate, Cytology and cultures were negative. PCCM asked to see on 3/31 for persistent abnormal cxr and non-diagnostic thora  SIGNIFICANT EVENTS / STUDIES:  Pleural fluid sample 3/26: Pleural cytology: REACTIVE MESOTHELIAL CELLS. THE MESOTHELIAL CELLS ARE POSITIVE FOR CALRETININ, WT-1, AND CYTOKERATIN 5/6. THEY ARE NEGATIVE FOR CEA, MOC-31, P63, AND TTF-1. P. Protein 3/26: 5.1 P. LDH 3/26: 149 P. WBC: 2948 P neut: 56    CT chest 3/31>>>Small bilateral pleural effusions, L>R, with bilateral basilar atx vs consolidation. Improved since previous study. Stable appearance of small pericardial effusion.   LINES / TUBES:   CULTURES: Pleural culture 3/31>>> neg  ANTIBIOTICS:    SUBJECTIVE:    VITAL SIGNS: Temp:  [98.1 F (36.7 C)-98.8 F (37.1 C)] 98.8 F (37.1 C) (04/01 0500) Pulse Rate:  [74-80] 80 (04/01 0500) Resp:  [16-18] 18 (04/01 0500) BP: (110-142)/(61-89) 114/61 mmHg (04/01 0943) SpO2:  [95 %-99 %] 97 % (04/01 0500) Weight:  [173 lb 1 oz (78.5 kg)] 173 lb 1 oz (78.5 kg) (04/01 0500)  INTAKE / OUTPUT: Intake/Output     03/31 0701 - 04/01 0700 04/01 0701 - 04/02 0700   P.O. 720 360   Other 240    IV Piggyback 50    Total Intake(mL/kg) 1010 (12.9) 360 (4.6)   Urine (mL/kg/hr) 550 (0.3) 600 (2.1)   Total Output 550 600   Net +460 -240         PHYSICAL EXAMINATION: General:  Well developed AAF no acute distress Neuro:  Awake, oriented, no focal def  HEENT:  [place], no JVD  Cardiovascular:   rrr Lungs:  Decreased in bases  Abdomen:  Soft non tender  Musculoskeletal:  Intact  Skin:  LE edema   LABS:  Recent Labs Lab 11/22/12 0500 11/24/12 0500 11/25/12 1000  NA 134* 134* 137  K 4.3 3.2* 4.0  CL 99 95* 99  CO2 23 28 29   BUN 17 21 17   CREATININE 1.15* 1.19* 1.24*  GLUCOSE 129* 178* 107*    Recent Labs Lab 11/22/12 0500 11/24/12 0500 11/25/12 1000  HGB 9.4* 8.7* 8.6*  HCT 27.9* 25.7* 25.7*  WBC 12.1* 11.0* 9.8  PLT 470* 499* 489*  ANA 3/11: negative   Erythrocyte Sedimentation Rate     Component Value Date/Time   ESRSEDRATE 85* 11/05/2012 1415    Recent Labs Lab 11/25/12 0744 11/25/12 1204 11/25/12 1632 11/25/12 2133 11/26/12 0725  GLUCAP 134* 153* 116* 137* 129*    CXR: LLL ATX/ vs consolidative infiltrate, sm right effusion, some increase in left effusion   ASSESSMENT / PLAN: 1) recurrent Left effusion w/ pleuritic type CP 2) recent PE/DVT: seemingly unprovoked.  3) recent h/o possible PNA (NOS)  4) LLL ATX/ consolidative changes.   She is s/p left thoracentesis which was exudative by lights criteria. She continues to have an abnormal CXR but symptomatically has improved some. It is troubling that we do not have a clear provoking explanation for her DVT/PE. Seemingly auto-immune/ connective tissue disorder moving up on diff dx as uniting  diagnosis for effusion and recent PE. Seems less likely parapneumonic effusion if it is re accumulating. May also consider undiagnosed malignancy. She has had recent mammogram which was neg, states had recent fecal occult blood which was neg.   Plan: -Follow auto-immune studies:         Antinuclear antibodies (ANA) -->neg      Antiphospholipid antibodies-->      Antibodies to double stranded DNA (dsDNA) -->less than 1      Serum complement levels C3 and C4-->  165 / 41        Rheumatoid Factor-->22         Homocysteine-->11.1  -consider d/c abx (defer to primary) -trial one dose ibuprofen for chest pain  4/1 -repeat CT 4/1 with improvement in effusions -Would continue at least 8-10 weeks of anticoagulation and then repeat CT scan  -She should keep her GYN appointment w/ Cherly Hensen for PAP smear  -Ensure she has f/u GI appointment for routine colonoscopy after d/c    Canary Brim, NP-C [place] Pgr: [phone] or [phone]    11/26/2012, 10:40 AM   Reviewed above, examined pt, and agree with assessment/plan.  She has pleuritic type chest pain.  She has pleural effusion and Lt base atelectasis/consolidation.  This shows some improvement, but is still present.  She reports hand/wrist discomfort and swelling.  She has elevated ESR.  Will repeat ESR, RF, anti CCP, ANA.  Will give trial of prednisone.  Continue rocephin for now.  Coralyn Helling, MD [facility] Pulmonary/Critical Care 11/26/2012, 2:33 PM Pager:  [phone] After 3pm call: [phone]

## 2012-11-27 ENCOUNTER — Inpatient Hospital Stay (HOSPITAL_COMMUNITY): Payer: 59

## 2012-11-27 DIAGNOSIS — I82409 Acute embolism and thrombosis of unspecified deep veins of unspecified lower extremity: Secondary | ICD-10-CM

## 2012-11-27 LAB — CBC WITH DIFFERENTIAL/PLATELET
Basophils Absolute: 0 10*3/uL (ref 0.0–0.1)
Basophils Relative: 0 % (ref 0–1)
Eosinophils Absolute: 0 10*3/uL (ref 0.0–0.7)
HCT: 24.3 % — ABNORMAL LOW (ref 36.0–46.0)
Hemoglobin: 8.1 g/dL — ABNORMAL LOW (ref 12.0–15.0)
MCH: 25.5 pg — ABNORMAL LOW (ref 26.0–34.0)
MCHC: 33.3 g/dL (ref 30.0–36.0)
Monocytes Absolute: 0.2 10*3/uL (ref 0.1–1.0)
Monocytes Relative: 2 % — ABNORMAL LOW (ref 3–12)
Neutro Abs: 7.8 10*3/uL — ABNORMAL HIGH (ref 1.7–7.7)
Neutrophils Relative %: 89 % — ABNORMAL HIGH (ref 43–77)
RDW: 15.2 % (ref 11.5–15.5)

## 2012-11-27 LAB — BASIC METABOLIC PANEL
BUN: 22 mg/dL (ref 6–23)
Calcium: 9.8 mg/dL (ref 8.4–10.5)
Chloride: 98 mEq/L (ref 96–112)
Creatinine, Ser: 1.27 mg/dL — ABNORMAL HIGH (ref 0.50–1.10)
GFR calc Af Amer: 55 mL/min — ABNORMAL LOW (ref >90)

## 2012-11-27 LAB — PROTEIN S, TOTAL: Protein S Ag, Total: 97 % (ref 60–150)

## 2012-11-27 LAB — PROTEIN C, TOTAL: Protein C, Total: 61 % — ABNORMAL LOW (ref 72–160)

## 2012-11-27 LAB — RHEUMATOID FACTOR: Rhuematoid fact SerPl-aCnc: 15 IU/mL — ABNORMAL HIGH (ref <=14)

## 2012-11-27 LAB — CYCLIC CITRUL PEPTIDE ANTIBODY, IGG: Cyclic Citrullin Peptide Ab: <2 U/mL (ref 0.0–5.0)

## 2012-11-27 MED ORDER — PREDNISONE 20 MG PO TABS
20.0000 mg | ORAL_TABLET | Freq: Every day | ORAL | Status: DC
  Administered 2012-11-28: 20 mg via ORAL
  Filled 2012-11-27 (×2): qty 1

## 2012-11-27 MED ORDER — FUROSEMIDE 40 MG PO TABS
40.0000 mg | ORAL_TABLET | Freq: Every day | ORAL | Status: DC
  Administered 2012-11-28: 40 mg via ORAL
  Filled 2012-11-27: qty 1

## 2012-11-27 NOTE — Progress Notes (Signed)
Inpatient Diabetes Program Recommendations  AACE/ADA: New Consensus Statement on Inpatient Glycemic Control (2013)  Target Ranges:  Prepandial:   less than 140 mg/dL      Peak postprandial:   less than 180 mg/dL (1-2 hours)      Critically ill patients:  140 - 180 mg/dL    Results for Cynthia Cameron, Cynthia Cameron (MRN [id number]) as of 11/27/2012 11:09  Ref. Range 11/27/2012 08:09 11/27/2012 10:42  Glucose-Capillary Latest Range: 70-99 mg/dL 244 (H) 010 (H)     Inpatient Diabetes Program Recommendations Correction (SSI): Please start Novolog Sensitive correction scale (SSI) tid ac + HS.  Will follow. Ambrose Finland RN, MSN, CDE Diabetes Coordinator Inpatient Diabetes Program [phone]

## 2012-11-27 NOTE — Progress Notes (Signed)
Subjective:  Feeling better. Left sided pleuritic chest pain subsiding with prednisone use. Afebrile.  Objective:  Vital Signs in the last 24 hours: Temp:  [97.8 F (36.6 C)-98.3 F (36.8 C)] 97.8 F (36.6 C) (04/02 1400) Pulse Rate:  [58-68] 68 (04/02 1400) Cardiac Rhythm:  [-] Normal sinus rhythm (04/02 0731) Resp:  [18] 18 (04/02 1400) BP: (144-157)/(92-98) 154/92 mmHg (04/02 1400) SpO2:  [94 %-95 %] 94 % (04/02 1400) Weight:  [78.472 kg (173 lb)] 78.472 kg (173 lb) (04/02 0700)  Physical Exam: BP Readings from Last 1 Encounters:  11/27/12 154/92     Wt Readings from Last 1 Encounters:  11/27/12 78.472 kg (173 lb)    Weight change: -0.028 kg (-1 oz)  HEENT: [place]/AT, Eyes-Brown, PERL, EOMI, Conjunctiva-Pale, Sclera-Non-icteric Neck: No JVD, No bruit, Trachea midline. Lungs:  Clearing, Bilateral. Dullness both bases, left more than right. Cardiac:  Regular rhythm, normal S1 and S2, no S3.  Abdomen:  Soft, non-tender. Extremities:  No edema present. No cyanosis. No clubbing. CNS: AxOx3, Cranial nerves grossly intact, moves all 4 extremities. Right handed. Skin: Warm and dry.   Intake/Output from previous day: 04/01 0701 - 04/02 0700 In: 720 [P.O.:720] Out: 1950 [Urine:1950]    Lab Results: BMET    Component Value Date/Time   NA 134* 11/27/2012 0515   K 4.8 11/27/2012 0515   CL 98 11/27/2012 0515   CO2 28 11/27/2012 0515   GLUCOSE 202* 11/27/2012 0515   BUN 22 11/27/2012 0515   CREATININE 1.27* 11/27/2012 0515   CREATININE 1.50* 01/29/2012 1616   CREATININE 1.50* 01/29/2012 1616   CALCIUM 9.8 11/27/2012 0515   GFRNONAA 48* 11/27/2012 0515   GFRAA 55* 11/27/2012 0515   CBC    Component Value Date/Time   WBC 8.8 11/27/2012 0515   WBC 12.9* 11/03/2012 1723   RBC 3.18* 11/27/2012 0515   RBC 4.40 11/03/2012 1723   HGB 8.1* 11/27/2012 0515   HGB 11.6* 11/03/2012 1723   HCT 24.3* 11/27/2012 0515   HCT 36.8* 11/03/2012 1723   PLT 470* 11/27/2012 0515   MCV 76.4* 11/27/2012 0515   MCV 83.6 11/03/2012  1723   MCH 25.5* 11/27/2012 0515   MCH 26.4* 11/03/2012 1723   MCHC 33.3 11/27/2012 0515   MCHC 31.5* 11/03/2012 1723   RDW 15.2 11/27/2012 0515   LYMPHSABS 0.8 11/27/2012 0515   MONOABS 0.2 11/27/2012 0515   EOSABS 0.0 11/27/2012 0515   BASOSABS 0.0 11/27/2012 0515   CARDIAC ENZYMES Lab Results  Component Value Date   TROPONINI <0.30 11/03/2012    Scheduled Meds: . antiseptic oral rinse  15 mL Mouth Rinse BID  . buPROPion  50 mg Oral Daily  . cefTRIAXone (ROCEPHIN)  IV  1 g Intravenous Q24H  . cloNIDine  0.2 mg Oral QHS  . [START ON 11/28/2012] furosemide  40 mg Oral Daily  . linagliptin  5 mg Oral Daily  . lisinopril  20 mg Oral Daily  . nebivolol  5 mg Oral Daily  . OLANZapine  2.5 mg Oral QHS  . potassium chloride  40 mEq Oral BID  . [START ON 11/28/2012] predniSONE  20 mg Oral Q breakfast  . [START ON 12/01/2012] rivaroxaban  20 mg Oral Q supper  . rivaroxaban  15 mg Oral BID WC  . simvastatin  10 mg Oral q1800  . sodium chloride  3 mL Intravenous Q12H   Continuous Infusions:  PRN Meds:.sodium chloride, chlorpheniramine-HYDROcodone, docusate sodium, HYDROcodone-acetaminophen, MUSCLE RUB, ondansetron, sodium chloride, sodium chloride  Assessment/Plan: Chest pain, pleuritic  Recent Pulmonary embolism  Bilateral Pleural effusion now decreasing  Left lung atelectasis.  Recent Left leg DVT  Possible pneumonia.  Hypertension  CKD, II  Anxiety  DM, II  Prednisone use per pulmonary. Home soon. Change to oral lasix.   LOS: 9 days    Orpah Cobb  MD  11/27/2012, 7:37 PM

## 2012-11-27 NOTE — Progress Notes (Signed)
PULMONARY  / CRITICAL CARE MEDICINE  Name: Cynthia Cameron MRN: [id number] DOB: November 18, 1960    ADMISSION DATE:  11/18/2012 CONSULTATION DATE:  3/31  REFERRING MD :  Algie Coffer  PRIMARY SERVICE: Algie Coffer   CHIEF COMPLAINT:   Abnormal CXR/ exudative pleural effusion   BRIEF PATIENT DESCRIPTION:  52 year old female s/p recent hospital treated for PE, DVT and PNA (DVT/PE seemingly unprovoked).  Admitted w/ CP on 3/24. Found to have worsening Left effusion-->thora 3/26-->exudate, Cytology and cultures were negative. PCCM asked to see on 3/31 for persistent abnormal cxr and non-diagnostic thoracentesis.  STUDIES:  CT chest 11/04/12 >> moderate pericardial fluid, borderline LAN, mod Lt effusion with passive ATX LLL Lt thoracentesis 11/05/12 >> small volume removed, not enough for analysis Doppler legs 11/06/12 >> DVT Lt posterior tibial and peroneal vein CT chest 11/08/12 >> small/mod pericardial effusion, Lt > Rt pleural effusions, Rt pulmonary artery PE, ATX LLL Echo 11/19/12 >> EF 50 to 55% Lt thoracentesis 11/20/12 >>900 cc turbid fluid, protein 5.1, LDH 149, WBC 2948 (56N, 37L, 70M), reactive mesothelial cells CT chest 11/25/12 >> small Lt > Rt pleural effusions, basilar ATX/consolidation, decreased pericardial effusion  CULTURES: Blood 3/24 >> Blood 3/25 >> Pleural culture 3/26 >> negative  ANTIBIOTICS Rocephin 3/24 >>  SUBJECTIVE:  Breathing much better.  No longer has chest pain.  VITAL SIGNS: Temp:  [98 F (36.7 C)-98.7 F (37.1 C)] 98.3 F (36.8 C) (04/02 0700) Pulse Rate:  [58-78] 58 (04/02 0700) Resp:  [18] 18 (04/02 0700) BP: (114-157)/(61-98) 144/92 mmHg (04/02 0700) SpO2:  [94 %-98 %] 95 % (04/02 0700) Weight:  [173 lb (78.472 kg)] 173 lb (78.472 kg) (04/02 0700)  INTAKE / OUTPUT: Intake/Output     04/01 0701 - 04/02 0700 04/02 0701 - 04/03 0700   P.O. 720    Other     IV Piggyback     Total Intake(mL/kg) 720 (9.2)    Urine (mL/kg/hr) 1950 (1)    Total Output 1950      Net -1230           PHYSICAL EXAMINATION: General: No distress Neuro: Normal strength HEENT:  No sinus tenderness Cardiovascular:  Regular, no murmur Lungs: Better air movement, no wheeze Abdomen:  Soft, non tender Musculoskeletal:  No edema Skin: no rashes  LABS:  Recent Labs Lab 11/24/12 0500 11/25/12 1000 11/27/12 0515  NA 134* 137 134*  K 3.2* 4.0 4.8  CL 95* 99 98  CO2 28 29 28   BUN 21 17 22   CREATININE 1.19* 1.24* 1.27*  GLUCOSE 178* 107* 202*    Recent Labs Lab 11/24/12 0500 11/25/12 1000 11/27/12 0515  HGB 8.7* 8.6* 8.1*  HCT 25.7* 25.7* 24.3*  WBC 11.0* 9.8 8.8  PLT 499* 489* 470*    Lab Results  Component Value Date   ANA NEGATIVE 11/05/2012   RF 15* 11/26/2012   Erythrocyte Sedimentation Rate     Component Value Date/Time   ESRSEDRATE 132* 11/26/2012 1518    Recent Labs Lab 11/25/12 2133 11/26/12 0725 11/26/12 1115 11/26/12 1641 11/27/12 0809  GLUCAP 137* 129* 165* 149* 154*    Imaging: Ct Chest W Contrast  11/25/2012  *RADIOLOGY REPORT*  Clinical Data: Chest pain and shortness of breath.  Left pleural effusion.  CT CHEST WITH CONTRAST  Technique:  Multidetector CT imaging of the chest was performed following the standard protocol during bolus administration of intravenous contrast.  Contrast: 80mL OMNIPAQUE IOHEXOL 300 MG/ML  SOLN  Comparison: 11/07/2012  Findings:  There are small bilateral pleural effusions, greater on the left, with bilateral basilar atelectasis or consolidation. These changes are improving since the previous study.  The pericardial effusion remains present but is also improved.  Diffuse cardiac enlargement is stable.  Normal caliber thoracic aorta.  No significant lymphadenopathy in the chest.  The esophagus is decompressed.  No developing parenchymal consolidation.  No pneumothorax.  The contrast bolus is not sufficient for follow-up of previously identified pulmonary emboli in the right upper lung. Right central venous  catheter is in place with tip in the superior vena cava.  Visualized upper abdominal organs are unremarkable. Degenerative changes in the thoracic spine.  IMPRESSION: Small bilateral pleural effusions, greater on the left, with bilateral basilar atelectasis or consolidation.  These changes are improving since the previous study.  Stable appearance of small pericardial effusion.   Original Report Authenticated By: Burman Nieves, M.D.      ASSESSMENT / PLAN: 1) recurrent Left effusion w/ pleuritic type CP 2) recent PE/DVT: seemingly unprovoked.  3) recent h/o possible PNA (NOS)  4) LLL ATX/ consolidative changes.  Non-specific pleuritis with exudative Lt pleural effusion. Improved with addition of prednisone. Plan: F/u CXR 4/02 >> is stable or improved then okay for d/c home from pulmonary standpoint Change prednisone to 20 mg daily on 4/02 >> keep on this dose until evaluated in pulmonary office in 1 week  Atelectasis with consolidation in LLL with concern for PNA. Plan: D10 of Abx >> will d/c after dose on 4/02 Continue bronchial hygiene  PE/DVT. Plan: Continue xarelto  Coralyn Helling, MD [facility] Pulmonary/Critical Care 11/27/2012, 8:23 AM Pager:  [phone] After 3pm call: [phone]

## 2012-11-28 LAB — OCCULT BLOOD X 1 CARD TO LAB, STOOL: Fecal Occult Bld: NEGATIVE

## 2012-11-28 LAB — CBC
HCT: 22.5 % — ABNORMAL LOW (ref 36.0–46.0)
MCH: 25.4 pg — ABNORMAL LOW (ref 26.0–34.0)
MCV: 76.3 fL — ABNORMAL LOW (ref 78.0–100.0)
RBC: 2.95 MIL/uL — ABNORMAL LOW (ref 3.87–5.11)
WBC: 12.8 10*3/uL — ABNORMAL HIGH (ref 4.0–10.5)

## 2012-11-28 LAB — BASIC METABOLIC PANEL
BUN: 24 mg/dL — ABNORMAL HIGH (ref 6–23)
CO2: 27 mEq/L (ref 19–32)
Calcium: 9.6 mg/dL (ref 8.4–10.5)
Chloride: 101 mEq/L (ref 96–112)
Creatinine, Ser: 1.27 mg/dL — ABNORMAL HIGH (ref 0.50–1.10)
Glucose, Bld: 121 mg/dL — ABNORMAL HIGH (ref 70–99)

## 2012-11-28 LAB — IRON AND TIBC
Iron: 56 ug/dL (ref 42–135)
Saturation Ratios: 25 % (ref 20–55)
TIBC: 228 ug/dL — ABNORMAL LOW (ref 250–470)
UIBC: 172 ug/dL (ref 125–400)

## 2012-11-28 MED ORDER — FERROUS SULFATE 325 (65 FE) MG PO TABS
325.0000 mg | ORAL_TABLET | Freq: Two times a day (BID) | ORAL | Status: DC
  Filled 2012-11-28 (×2): qty 1

## 2012-11-28 MED ORDER — FUROSEMIDE 40 MG PO TABS: 40.0000 mg | ORAL_TABLET | Freq: Every day | ORAL | Status: AC

## 2012-11-28 MED ORDER — PREDNISONE 20 MG PO TABS: 20.0000 mg | ORAL_TABLET | Freq: Every day | ORAL | Status: DC

## 2012-11-28 NOTE — Progress Notes (Signed)
RIJ CVC removed per order. No signs of bleeding or infection  Noted.  Verbalizes understanding of  Signs of infection and bleeding and interventions via teach back method.  No ADN.

## 2012-11-28 NOTE — Progress Notes (Signed)
DC orders received.  Patient stable with no S/S of distress.  Medication and discharge information reviewed with patient and patient's husband.  Patient DC home with husband. Cynthia Cameron  

## 2012-11-28 NOTE — Discharge Summary (Signed)
Physician Discharge Summary  Patient ID: Cynthia Cameron MRN: [id number] DOB/AGE: [birth year] 52 y.o.  Admit date: 11/18/2012 Discharge date: 11/28/2012  Admission Diagnoses: Chest pain, pleuritic  Recent Pulmonary embolism  Bilateral Pleural effusion   Recent Left leg DVT  Possible pneumonia.  Hypertension  CKD, II  Anxiety  DM, II  Discharge Diagnoses:  Principle problem: * Chest pain, pleuritic * Recent Pulmonary embolism  Bilateral Pleural effusions, exudative. Left lung atelectasis.  Recent Left leg DVT  Possible pneumonia.  Hypertension  CKD, II  Anxiety  DM, II Iron deficiency anemia, chronic, recurrent  Discharged Condition: fair  Hospital Course: 52 years old female with recent prolonged hospitalization from pneumonia, PE, DVT has recurrent left sided chest pain. CXR done onadmission showed worsening of left sided pleural effusion. Patient had 900 cc of exudative fluid recovered by interventional radiologist. AFB culture and fungal smear and culture were negative. She was treated with IV rocephin and post pulmonary consult with oral prednisone. She had significant decrease in pleuritic chest pain with just one dose of prednisone. She had good diuresis with Lasix. Incentive spirometry was used for significant atelectasis. She will resume her Iron supplements. She was discharged home in satisfactory condition with f/u in 1-2 weeks.  Consults: pulmonary/intensive care  Significant Diagnostic Studies: labs: Normal electrolytes and mild anemia gradually worsening to 7.5.  Radiology:  CXR on 11/18/2012: Moderate to large left pleural effusion, increased since prior study. Small right pleural effusion is stable. Bibasilar atelectasis or infiltrate.  CXR on 11/27/2012: Mildly improved left basilar opacity is noted, however significant amount of residual pneumonia or atelectasis with associated pleural effusion remains.  CT chest with contrast on 11/25/2012:Small  bilateral pleural effusions, greater on the left, with bilateral basilar atelectasis or consolidation. These changes are improving since the previous study. Stable appearance of small pericardial effusion.  Treatments: antibiotics: ceftriaxone and procedures: arterial line and thoracentesis positive for exudate and mesothelial cells. IV lasix and incentive spirometry.  Discharge Exam: Blood pressure 148/90, pulse 70, temperature 97.7 F (36.5 C), temperature source Oral, resp. rate 18, height 5\' 4"  (1.626 m), weight 79.697 kg (175 lb 11.2 oz), last menstrual period 09/16/2010, SpO2 98.00%.  HEENT: [place]/AT, Eyes-Brown, PERL, EOMI, Conjunctiva-Pale, Sclera-Non-icteric  Neck: No JVD, No bruit, Trachea midline.  Lungs: Clearing, Bilateral. Dullness both bases, left more than right.  Cardiac: Regular rhythm, normal S1 and S2, no S3.  Abdomen: Soft, non-tender.  Extremities: No edema present. No cyanosis. No clubbing.  CNS: AxOx3, Cranial nerves grossly intact, moves all 4 extremities. Right handed.  Skin: Warm and dry.  Disposition: 01-Home or Self Care     Medication List    STOP taking these medications       fexofenadine-pseudoephedrine 180-240 MG per 24 hr tablet  Commonly known as:  ALLEGRA-D 24     KAZANO 12.12-998 MG Tabs  Generic drug:  Alogliptin-Metformin HCl      TAKE these medications       buPROPion 100 MG tablet  Commonly known as:  WELLBUTRIN  Take 50 mg by mouth daily.     chlorpheniramine-HYDROcodone 10-8 MG/5ML Lqcr  Commonly known as:  TUSSIONEX  Take 5 mLs by mouth 2 (two) times daily as needed (for cough).     cloNIDine 0.2 MG tablet  Commonly known as:  CATAPRES  Take 0.2 mg by mouth at bedtime.     docusate sodium 100 MG capsule  Commonly known as:  COLACE  Take 100 mg by mouth daily as needed  for constipation.     furosemide 40 MG tablet  Commonly known as:  LASIX  Take 1 tablet (40 mg total) by mouth daily.     lisinopril 20 MG tablet  Commonly  known as:  PRINIVIL,ZESTRIL  Take 20 mg by mouth daily.     multivitamin with minerals Tabs  Take 1 tablet by mouth daily.     nebivolol 10 MG tablet  Commonly known as:  BYSTOLIC  Take 10 mg by mouth daily.     OLANZapine 5 MG tablet  Commonly known as:  ZYPREXA  Take 5 mg by mouth at bedtime.     potassium chloride 10 MEQ tablet  Commonly known as:  K-DUR,KLOR-CON  Take 20 mEq by mouth 2 (two) times daily.     pravastatin 40 MG tablet  Commonly known as:  PRAVACHOL  Take 40 mg by mouth daily.     predniSONE 20 MG tablet  Commonly known as:  DELTASONE  Take 1 tablet (20 mg total) by mouth daily with breakfast.     Rivaroxaban 15 MG Tabs tablet  Commonly known as:  XARELTO  Take 1 tablet (15 mg total) by mouth 2 (two) times daily.     Rivaroxaban 20 MG Tabs  Commonly known as:  XARELTO  Take 1 tablet (20 mg total) by mouth daily.  Start taking on:  12/01/2012     vitamin B-12 1000 MCG tablet  Commonly known as:  CYANOCOBALAMIN  Take 1,000 mcg by mouth daily as needed (for energy).           Follow-up Information   Follow up with COPLAND,JESSICA, MD In 2 weeks.   Contact information:   [address] [place] [address] [phone]       Follow up with [facility] S, MD. Schedule an appointment as soon as possible for a visit in 2 weeks.   Contact information:   [address] [place] [address] [phone]       Signed: Ricki Rodriguez 11/28/2012, 12:47 PM

## 2012-11-29 ENCOUNTER — Other Ambulatory Visit: Payer: Self-pay | Admitting: Family Medicine

## 2012-11-29 ENCOUNTER — Other Ambulatory Visit: Payer: Self-pay | Admitting: Radiology

## 2012-11-29 NOTE — Telephone Encounter (Signed)
Faxed Rx

## 2012-12-02 ENCOUNTER — Ambulatory Visit (INDEPENDENT_AMBULATORY_CARE_PROVIDER_SITE_OTHER): Payer: 59 | Admitting: Adult Health

## 2012-12-02 ENCOUNTER — Ambulatory Visit (INDEPENDENT_AMBULATORY_CARE_PROVIDER_SITE_OTHER)
Admission: RE | Admit: 2012-12-02 | Discharge: 2012-12-02 | Disposition: A | Discharge status: Home / Self Care | Payer: 59 | Source: Ambulatory Visit | Attending: Adult Health | Admitting: Adult Health

## 2012-12-02 ENCOUNTER — Encounter: Payer: Self-pay | Admitting: Adult Health

## 2012-12-02 ENCOUNTER — Other Ambulatory Visit (INDEPENDENT_AMBULATORY_CARE_PROVIDER_SITE_OTHER): Payer: 59

## 2012-12-02 VITALS — BP 128/72 | HR 67 | Temp 98.3°F | Ht 64.0 in | Wt 164.0 lb

## 2012-12-02 DIAGNOSIS — Y95 Nosocomial condition: Secondary | ICD-10-CM

## 2012-12-02 DIAGNOSIS — J189 Pneumonia, unspecified organism: Secondary | ICD-10-CM

## 2012-12-02 DIAGNOSIS — J90 Pleural effusion, not elsewhere classified: Secondary | ICD-10-CM

## 2012-12-02 DIAGNOSIS — I2699 Other pulmonary embolism without acute cor pulmonale: Secondary | ICD-10-CM

## 2012-12-02 LAB — CBC WITH DIFFERENTIAL/PLATELET
Basophils Relative: 0.2 % (ref 0.0–3.0)
Eosinophils Absolute: 0 10*3/uL (ref 0.0–0.7)
Eosinophils Relative: 0.2 % (ref 0.0–5.0)
Lymphocytes Relative: 8.5 % — ABNORMAL LOW (ref 12.0–46.0)
MCV: 79.9 fl (ref 78.0–100.0)
Monocytes Absolute: 0.1 10*3/uL (ref 0.1–1.0)
Neutrophils Relative %: 89.5 % — ABNORMAL HIGH (ref 43.0–77.0)
Platelets: 525 10*3/uL — ABNORMAL HIGH (ref 150.0–400.0)
RBC: 4 Mil/uL (ref 3.87–5.11)
WBC: 8.9 10*3/uL (ref 4.5–10.5)

## 2012-12-02 NOTE — Progress Notes (Signed)
Reviewed and agree with assessment/plan. 

## 2012-12-02 NOTE — Assessment & Plan Note (Signed)
S/p left pleural effusion s/p thoracentesis. Related to PE  Now resolved on cxr.

## 2012-12-02 NOTE — Assessment & Plan Note (Addendum)
PE and left DVT on xarelto ? unprevoked  Although had recent cardiac cath .  Will check to see if hypercoagulable panel was done.  Plan   Cont xarelto daily  follow up Dr. Craige Cotta  In 4 weeks

## 2012-12-02 NOTE — Patient Instructions (Addendum)
Continue on current regimen.  I will call with lab results.  follow up Dr. Craige Cotta  In 4 weeks and As needed

## 2012-12-02 NOTE — Progress Notes (Signed)
  Subjective:    Patient ID: Cynthia Cameron, female    DOB: 1961-04-04, 52 y.o.   MRN: [id number]  HPI 52 year old female seen for initial pulmonary consult. 11/18/2012 for PE, DVT, and pneumonia with left > right effusion.requiring thoracentesis.   12/02/2012 Post Hospital follow up  Patient returns for a one-week post hospital followup. Patient was re- admitted March 24 through the 31st for an worsening left effusion complicated by PE, DVT Prior to admission she experienced some chest pain. Underwent cardiac catheterization on 10/25/2012 w/ Nml EF and nml coronaries.  She was readmitted  3/9 w/ acute PE and left DVT on 3/9. Started on Xarelto. Readmitted  on March 24, found to have a worsening left effusion. Thoracentesis  Revealed an exudative effusion with negative cytology and cultures.   She denies any hx of clotting disorder. No family hx of clotting disorder.  She HRT or BCP .  Other than cardiac cath no recent surgeries.  No recent travel or immobility.  She works fulltime as Tourist information centre manager.   Since discharge she is feeling much better  CXR today shows complete clearance of infiltrates and effusions Tolerating Xarelto well. No known bleeding.  Prior to discharge hbg was trending down. (7.5) .  Hx of anemia . No previous colonoscopy.   Review of Systems Constitutional:   No  weight loss, night sweats,  Fevers, chills, fatigue, or  lassitude.  HEENT:   No headaches,  Difficulty swallowing,  Tooth/dental problems, or  Sore throat,                No sneezing, itching, ear ache, nasal congestion, post nasal drip,   CV:  No chest pain,  Orthopnea, PND, swelling in lower extremities, anasarca, dizziness, palpitations, syncope.   GI  No heartburn, indigestion, abdominal pain, nausea, vomiting, diarrhea, change in bowel habits, loss of appetite, bloody stools.   Resp: No shortness of breath with exertion or at rest.  No excess mucus, no productive cough,  No non-productive cough,  No  coughing up of blood.  No change in color of mucus.  No wheezing.  No chest wall deformity  Skin: no rash or lesions.  GU: no dysuria, change in color of urine, no urgency or frequency.  No flank pain, no hematuria   MS:  No joint pain or swelling.  No decreased range of motion.  No back pain.  Psych:  No change in mood or affect. No depression or anxiety.  No memory loss.         Objective:   Physical Exam GEN: A/Ox3; pleasant , NAD, well nourished   HEENT:  [place]/AT,  EACs-clear, TMs-wnl, NOSE-clear, THROAT-clear, no lesions, no postnasal drip or exudate noted.   NECK:  Supple w/ fair ROM; no JVD; normal carotid impulses w/o bruits; no thyromegaly or nodules palpated; no lymphadenopathy.  RESP  Clear  P & A; w/o, wheezes/ rales/ or rhonchi.no accessory muscle use, no dullness to percussion  CARD:  RRR, no m/r/g  , no peripheral edema, pulses intact, no cyanosis or clubbing.  GI:   Soft & nt; nml bowel sounds; no organomegaly or masses detected.  Musco: Warm bil, no deformities or joint swelling noted.   Neuro: alert, no focal deficits noted.    Skin: Warm, no lesions or rashes         Assessment & Plan:

## 2012-12-03 ENCOUNTER — Telehealth: Payer: Self-pay | Admitting: Family Medicine

## 2012-12-03 NOTE — Telephone Encounter (Signed)
Called to check on her- she is doing very well, "I'm pain free."  Has been back home now for a few days.  She will come and see me next week

## 2012-12-16 LAB — FUNGUS CULTURE W SMEAR

## 2012-12-17 ENCOUNTER — Ambulatory Visit (HOSPITAL_COMMUNITY)
Admission: RE | Admit: 2012-12-17 | Discharge: 2012-12-17 | Disposition: A | Discharge status: Home / Self Care | Payer: 59 | Source: Ambulatory Visit | Attending: Family Medicine | Admitting: Family Medicine

## 2012-12-17 ENCOUNTER — Telehealth (HOSPITAL_COMMUNITY): Payer: Self-pay | Admitting: Vascular Surgery

## 2012-12-17 ENCOUNTER — Ambulatory Visit (INDEPENDENT_AMBULATORY_CARE_PROVIDER_SITE_OTHER): Payer: 59 | Admitting: Family Medicine

## 2012-12-17 VITALS — BP 136/98 | HR 68 | Temp 97.8°F | Resp 16 | Ht 62.5 in | Wt 166.0 lb

## 2012-12-17 DIAGNOSIS — M79609 Pain in unspecified limb: Secondary | ICD-10-CM

## 2012-12-17 DIAGNOSIS — E119 Type 2 diabetes mellitus without complications: Secondary | ICD-10-CM

## 2012-12-17 DIAGNOSIS — I82402 Acute embolism and thrombosis of unspecified deep veins of left lower extremity: Secondary | ICD-10-CM

## 2012-12-17 DIAGNOSIS — I82409 Acute embolism and thrombosis of unspecified deep veins of unspecified lower extremity: Secondary | ICD-10-CM

## 2012-12-17 DIAGNOSIS — J189 Pneumonia, unspecified organism: Secondary | ICD-10-CM

## 2012-12-17 DIAGNOSIS — D649 Anemia, unspecified: Secondary | ICD-10-CM

## 2012-12-17 DIAGNOSIS — I10 Essential (primary) hypertension: Secondary | ICD-10-CM

## 2012-12-17 DIAGNOSIS — J90 Pleural effusion, not elsewhere classified: Secondary | ICD-10-CM

## 2012-12-17 DIAGNOSIS — I824Z9 Acute embolism and thrombosis of unspecified deep veins of unspecified distal lower extremity: Secondary | ICD-10-CM | POA: Insufficient documentation

## 2012-12-17 LAB — COMPREHENSIVE METABOLIC PANEL
Alkaline Phosphatase: 108 U/L (ref 39–117)
BUN: 22 mg/dL (ref 6–23)
CO2: 33 mEq/L — ABNORMAL HIGH (ref 19–32)
Creat: 1.2 mg/dL — ABNORMAL HIGH (ref 0.50–1.10)
Glucose, Bld: 256 mg/dL — ABNORMAL HIGH (ref 70–99)
Total Bilirubin: 0.4 mg/dL (ref 0.3–1.2)

## 2012-12-17 LAB — POCT CBC
HCT, POC: 36.4 % — AB (ref 37.7–47.9)
Hemoglobin: 11.2 g/dL — AB (ref 12.2–16.2)
Lymph, poc: 1.2 (ref 0.6–3.4)
MCH, POC: 25.4 pg — AB (ref 27–31.2)
MCHC: 30.8 g/dL — AB (ref 31.8–35.4)
POC LYMPH PERCENT: 13 %L (ref 10–50)
RDW, POC: 19.3 %
WBC: 8.9 10*3/uL (ref 4.6–10.2)

## 2012-12-17 LAB — POCT GLYCOSYLATED HEMOGLOBIN (HGB A1C): Hemoglobin A1C: 7

## 2012-12-17 NOTE — Progress Notes (Signed)
[facility] [address], [place] [address] [phone]  Date:  12/17/2012   Name:  Cynthia Cameron   DOB:  [birth year]   MRN:  [id number]  PCP:  Abbe Amsterdam, MD    Chief Complaint: Follow-up   History of Present Illness:  Cynthia Cameron is a 52 y.o. very pleasant female patient who presents with the following:  Here to recheck from recent hospitalization.  She was admitted from 3/24 to 4/3 (readmitted, had been previously admitted from 3/9 to 11/13/12) with PE, pleural effusion, recent left DVT, possible pneumonia, HTN, CKD, anxiety and DM II.  She had 900 ml of exudative fluid removed from her chest, with negative studies.  She was treated with IV rocephin, oral prednisone and lasix.  She is now off of prednisone and lasix.  Her cardiologist is Dr. Willy Eddy, at the [facility].  He has started her on something new for her blood sugar- tradjenta.  She is taking this until her renal function recovers.    She states that she is doing well.  Her CP is now resolved and her breathing is much better.  Overall she is very pleased with her progress and hopes she will be able to go back to work next month.   She had a repeat CXR per Dr Craige Cotta, her pulmonologist on 4/7 which showed that her pleural effusion has resolved CHEST - 2 VIEW  Comparison: 11/27/2012  Findings: Pulmonary infiltrates and pleural effusions have  completely resolved. The lungs are normally aerated. The heart  size is normal. Stable mild degenerative changes are present in  the thoracic spine.  IMPRESSION:  Complete resolution of infiltrates and effusions. No acute  findings.   She has noted some swelling in her left leg for the last 2 days.  It is mildly tender.  This is the same leg where she was found to have a DVT last month. She is taking xarelto currently. Per her cardiologist she will be on this for about 6 months.    She had a very high ferritin level on 11/28/12- ? If reactive.   Will recheck this for her today.  HG on 4/3 was 7.5, and Creat was 1.27 She has never seen a hematologist  Patient Active Problem List  Diagnosis  . Diabetes mellitus without complication  . Hypertension  . Depression  . HAP (hospital-acquired pneumonia)  . Pleural effusion  . Pleuritis  . Pericardial effusion  . Dyspnea  . Pulmonary embolism  . DVT (deep venous thrombosis)    Past Medical History  Diagnosis Date  . Allergy   . Depression   . Diabetes mellitus without complication   . Hypertension   . Pneumonia 10/2012  . Shortness of breath     Past Surgical History  Procedure Laterality Date  . Cesarean section    . Cardiac catheterization  2014    History  Substance Use Topics  . Smoking status: Never Smoker   . Smokeless tobacco: Never Used  . Alcohol Use: Yes     Comment: social    Family History  Problem Relation Age of Onset  . Hypertension Father   . Diabetes Mother   . Atrial fibrillation Mother     No Known Allergies  Medication list has been reviewed and updated.  Current Outpatient Prescriptions on File Prior to Visit  Medication Sig Dispense Refill  . buPROPion (WELLBUTRIN) 100 MG tablet Take 50 mg by mouth daily.       Marland Kitchen  cloNIDine (CATAPRES) 0.2 MG tablet Take 0.2 mg by mouth at bedtime.      Marland Kitchen lisinopril (PRINIVIL,ZESTRIL) 20 MG tablet Take 20 mg by mouth daily.      . Multiple Vitamin (MULTIVITAMIN WITH MINERALS) TABS Take 1 tablet by mouth daily.      . nebivolol (BYSTOLIC) 10 MG tablet Take 10 mg by mouth daily.      Marland Kitchen OLANZapine (ZYPREXA) 5 MG tablet Take 5 mg by mouth at bedtime.      . potassium chloride (K-DUR,KLOR-CON) 10 MEQ tablet Take 20 mEq by mouth 2 (two) times daily.      . Rivaroxaban (XARELTO) 20 MG TABS Take 1 tablet (20 mg total) by mouth daily.  30 tablet  0  . vitamin B-12 (CYANOCOBALAMIN) 1000 MCG tablet Take 1,000 mcg by mouth daily as needed (for energy).      . chlorpheniramine-HYDROcodone (TUSSIONEX) 10-8 MG/5ML  LQCR TAKE 5ML'S BY MOUTH EVERY 12 HOURS AS NEEDED  140 mL  0  . docusate sodium (COLACE) 100 MG capsule Take 100 mg by mouth daily as needed for constipation.      . furosemide (LASIX) 40 MG tablet Take 1 tablet (40 mg total) by mouth daily.  30 tablet  1  . pravastatin (PRAVACHOL) 40 MG tablet Take 40 mg by mouth daily.      . predniSONE (DELTASONE) 20 MG tablet Take 1 tablet (20 mg total) by mouth daily with breakfast.  30 tablet  0   No current facility-administered medications on file prior to visit.    Review of Systems:  As per HPI- otherwise negative.   Physical Examination: Filed Vitals:   12/17/12 1124  BP: 136/98  Pulse: 68  Temp: 97.8 F (36.6 C)  Resp: 16   Filed Vitals:   12/17/12 1124  Height: 5' 2.5" (1.588 m)  Weight: 166 lb (75.297 kg)   Body mass index is 29.86 kg/(m^2). Ideal Body Weight: Weight in (lb) to have BMI = 25: 138.6  GEN: WDWN, NAD, Non-toxic, A & O x 3, looks much better.   HEENT: Atraumatic, Normocephalic. Neck supple. No masses, No LAD. Ears and Nose: No external deformity. CV: RRR, No M/G/R. No JVD. No thrill. No extra heart sounds. PULM: CTA B, no wheezes, crackles, rhonchi. No retractions. No resp. distress. No accessory muscle use. ABD: S, NT, ND. No rebound. No HSM. EXTR: No c/c.  Minimal edema in the left ankle and foot. She does not have any tenderness, calf pain or cords.   NEURO Normal gait.  PSYCH: Normally interactive. Conversant. Not depressed or anxious appearing.  Calm demeanor.   Results for orders placed in visit on 12/17/12  POCT CBC      Result Value Range   WBC 8.9  4.6 - 10.2 K/uL   Lymph, poc 1.2  0.6 - 3.4   POC LYMPH PERCENT 13.0  10 - 50 %L   MID (cbc) 0.3  0 - 0.9   POC MID % 3.9  0 - 12 %M   POC Granulocyte 7.4 (*) 2 - 6.9   Granulocyte percent 83.1 (*) 37 - 80 %G   RBC 4.41  4.04 - 5.48 M/uL   Hemoglobin 11.2 (*) 12.2 - 16.2 g/dL   HCT, POC 16.1 (*) 09.6 - 47.9 %   MCV 82.6  80 - 97 fL   MCH, POC 25.4  (*) 27 - 31.2 pg   MCHC 30.8 (*) 31.8 - 35.4 g/dL   RDW, POC 19.3  Platelet Count, POC 335  142 - 424 K/uL   MPV 7.6  0 - 99.8 fL  POCT GLYCOSYLATED HEMOGLOBIN (HGB A1C)      Result Value Range   Hemoglobin A1C 7.0      Assessment and Plan: Anemia - Plan: POCT CBC, Ferritin  HTN (hypertension) - Plan: Comprehensive metabolic panel  Pleural effusion associated with pulmonary infection  DVT (deep venous thrombosis), left - Plan: Lower Extremity Venous Duplex Left  Type II or unspecified type diabetes mellitus without mention of complication, not stated as uncontrolled - Plan: POCT glycosylated hemoglobin (Hb A1C)  Hospital follow-up.  Ghislaine has made remarkable progress and is doing much better.  Her A1c shows acceptable control of her DM.  Await other labs today, as above.   Anemia is much better Refer for repeat doppler today to evaluate increased swelling left leg.  She is being treated with xarelto, but want to be sure clot is not extending.    Signed Abbe Amsterdam, MD  Received doppler report: per techs verbal report one clot is gone and the other is resolving, shows some flow through area  - Findings consistent with subacute deep vein thrombosis involving the left lower extremity. - The previous DVT in the PTV appears resolved. The peroneal veins demonstrate reconstitution.  Of her two DVT's one is now resolved and the other is improved.  Pt called and alerted.  Will follow- up pending the rest of her labs.

## 2012-12-17 NOTE — Patient Instructions (Addendum)
I will be in touch with the rest of your labs.   You have an ultrasound appointment at 2pm today at [facility]- the [place], section A (the new glass building).  If you pull to the curb they will valet park your car for you.

## 2012-12-17 NOTE — Progress Notes (Signed)
VASCULAR LAB PRELIMINARY  PRELIMINARY  PRELIMINARY  PRELIMINARY  Left lower extremity venous duplex completed.    Preliminary report:  No evidence of acute DVT.  Previous DVT in the PTV appears resolved.  Previous DVT in the peroneal vein appears resolving.  There is flow noted.  Cynthia Cameron, RVT 12/17/2012, 4:21 PM

## 2012-12-18 ENCOUNTER — Telehealth: Payer: Self-pay

## 2012-12-18 ENCOUNTER — Encounter: Payer: Self-pay | Admitting: Family Medicine

## 2012-12-18 LAB — FERRITIN: Ferritin: 213 ng/mL (ref 10–291)

## 2012-12-18 NOTE — Telephone Encounter (Signed)
Called her back- one thrombus is resolved, the other is resolving.  No need for anything further at this time.  Continue to follow- up as planned

## 2012-12-18 NOTE — Telephone Encounter (Signed)
Pt was seen yesterday by Dr. Patsy Lager and she is wanting to know the results from her ultrasound Call back number is [phone]

## 2012-12-23 ENCOUNTER — Ambulatory Visit
Admission: RE | Admit: 2012-12-23 | Discharge: 2012-12-23 | Disposition: A | Discharge status: Home / Self Care | Payer: 59 | Source: Ambulatory Visit | Attending: Cardiovascular Disease | Admitting: Cardiovascular Disease

## 2012-12-23 ENCOUNTER — Other Ambulatory Visit: Payer: Self-pay | Admitting: Cardiovascular Disease

## 2012-12-23 DIAGNOSIS — R079 Chest pain, unspecified: Secondary | ICD-10-CM

## 2012-12-24 ENCOUNTER — Encounter (HOSPITAL_COMMUNITY): Payer: Self-pay | Admitting: General Practice

## 2012-12-24 ENCOUNTER — Observation Stay (HOSPITAL_COMMUNITY)
Admission: AD | Admit: 2012-12-24 | Discharge: 2012-12-25 | Disposition: A | Discharge status: Home / Self Care | Payer: 59 | Source: Ambulatory Visit | Attending: Cardiovascular Disease | Admitting: Cardiovascular Disease

## 2012-12-24 ENCOUNTER — Observation Stay (HOSPITAL_COMMUNITY): Payer: 59

## 2012-12-24 DIAGNOSIS — Z86718 Personal history of other venous thrombosis and embolism: Secondary | ICD-10-CM | POA: Insufficient documentation

## 2012-12-24 DIAGNOSIS — I129 Hypertensive chronic kidney disease with stage 1 through stage 4 chronic kidney disease, or unspecified chronic kidney disease: Secondary | ICD-10-CM | POA: Insufficient documentation

## 2012-12-24 DIAGNOSIS — E119 Type 2 diabetes mellitus without complications: Secondary | ICD-10-CM | POA: Insufficient documentation

## 2012-12-24 DIAGNOSIS — F411 Generalized anxiety disorder: Secondary | ICD-10-CM | POA: Insufficient documentation

## 2012-12-24 DIAGNOSIS — Z86711 Personal history of pulmonary embolism: Secondary | ICD-10-CM | POA: Insufficient documentation

## 2012-12-24 DIAGNOSIS — N182 Chronic kidney disease, stage 2 (mild): Secondary | ICD-10-CM | POA: Insufficient documentation

## 2012-12-24 DIAGNOSIS — R071 Chest pain on breathing: Principal | ICD-10-CM | POA: Insufficient documentation

## 2012-12-24 HISTORY — DX: Type 2 diabetes mellitus without complications: E11.9

## 2012-12-24 HISTORY — DX: Acute embolism and thrombosis of unspecified deep veins of unspecified lower extremity: I82.409

## 2012-12-24 HISTORY — DX: Bipolar disorder, unspecified: F31.9

## 2012-12-24 HISTORY — DX: Other pulmonary embolism without acute cor pulmonale: I26.99

## 2012-12-24 HISTORY — DX: Pure hypercholesterolemia, unspecified: E78.00

## 2012-12-24 HISTORY — DX: Chest pain, unspecified: R07.9

## 2012-12-24 HISTORY — DX: Iron deficiency anemia, unspecified: D50.9

## 2012-12-24 LAB — CBC WITH DIFFERENTIAL/PLATELET
Eosinophils Absolute: 0.1 10*3/uL (ref 0.0–0.7)
Hemoglobin: 12.1 g/dL (ref 12.0–15.0)
Lymphocytes Relative: 12 % (ref 12–46)
Lymphs Abs: 1.8 10*3/uL (ref 0.7–4.0)
MCH: 26 pg (ref 26.0–34.0)
MCV: 78.5 fL (ref 78.0–100.0)
Monocytes Relative: 5 % (ref 3–12)
Neutrophils Relative %: 83 % — ABNORMAL HIGH (ref 43–77)
Platelets: 257 10*3/uL (ref 150–400)
RBC: 4.65 MIL/uL (ref 3.87–5.11)
WBC: 15.9 10*3/uL — ABNORMAL HIGH (ref 4.0–10.5)

## 2012-12-24 LAB — BASIC METABOLIC PANEL
CO2: 32 mEq/L (ref 19–32)
GFR calc non Af Amer: 37 mL/min — ABNORMAL LOW (ref >90)
Glucose, Bld: 348 mg/dL — ABNORMAL HIGH (ref 70–99)
Potassium: 4.2 mEq/L (ref 3.5–5.1)
Sodium: 135 mEq/L (ref 135–145)

## 2012-12-24 LAB — GLUCOSE, CAPILLARY: Glucose-Capillary: 437 mg/dL — ABNORMAL HIGH (ref 70–99)

## 2012-12-24 LAB — HEMOGLOBIN A1C
Hgb A1c MFr Bld: 7.7 % — ABNORMAL HIGH (ref <5.7)
Mean Plasma Glucose: 174 mg/dL — ABNORMAL HIGH (ref <117)

## 2012-12-24 MED ORDER — DOCUSATE SODIUM 100 MG PO CAPS
100.0000 mg | ORAL_CAPSULE | Freq: Every day | ORAL | Status: DC | PRN
  Filled 2012-12-24: qty 1

## 2012-12-24 MED ORDER — POTASSIUM CHLORIDE CRYS ER 20 MEQ PO TBCR
20.0000 meq | EXTENDED_RELEASE_TABLET | Freq: Two times a day (BID) | ORAL | Status: DC
  Administered 2012-12-24: 20 meq via ORAL
  Filled 2012-12-24 (×3): qty 1

## 2012-12-24 MED ORDER — SODIUM CHLORIDE 0.9 % IJ SOLN: 3.0000 mL | Freq: Two times a day (BID) | INTRAMUSCULAR | Status: DC

## 2012-12-24 MED ORDER — BUPROPION HCL 100 MG PO TABS
50.0000 mg | ORAL_TABLET | Freq: Every day | ORAL | Status: DC
  Administered 2012-12-24 – 2012-12-25 (×2): 50 mg via ORAL
  Filled 2012-12-24 (×2): qty 0.5

## 2012-12-24 MED ORDER — INSULIN GLARGINE 100 UNIT/ML ~~LOC~~ SOLN
10.0000 [IU] | Freq: Every day | SUBCUTANEOUS | Status: DC
  Filled 2012-12-24: qty 0.1

## 2012-12-24 MED ORDER — OXYCODONE-ACETAMINOPHEN 5-325 MG PO TABS
1.0000 | ORAL_TABLET | ORAL | Status: DC | PRN
  Administered 2012-12-24 – 2012-12-25 (×4): 1 via ORAL
  Filled 2012-12-24 (×4): qty 1

## 2012-12-24 MED ORDER — INSULIN ASPART 100 UNIT/ML ~~LOC~~ SOLN
9.0000 [IU] | Freq: Once | SUBCUTANEOUS | Status: AC
  Administered 2012-12-24: 9 [IU] via SUBCUTANEOUS

## 2012-12-24 MED ORDER — GUAIFENESIN-DM 100-10 MG/5ML PO SYRP: 5.0000 mL | ORAL_SOLUTION | ORAL | Status: DC | PRN

## 2012-12-24 MED ORDER — CLONIDINE HCL 0.2 MG PO TABS
0.2000 mg | ORAL_TABLET | Freq: Every day | ORAL | Status: DC
  Administered 2012-12-24: 0.2 mg via ORAL
  Filled 2012-12-24 (×2): qty 1

## 2012-12-24 MED ORDER — LISINOPRIL 20 MG PO TABS
20.0000 mg | ORAL_TABLET | Freq: Every day | ORAL | Status: DC
  Filled 2012-12-24 (×2): qty 1

## 2012-12-24 MED ORDER — NEBIVOLOL HCL 10 MG PO TABS
10.0000 mg | ORAL_TABLET | Freq: Every day | ORAL | Status: DC
  Administered 2012-12-24 – 2012-12-25 (×2): 10 mg via ORAL
  Filled 2012-12-24 (×2): qty 1

## 2012-12-24 MED ORDER — PREDNISONE 20 MG PO TABS
20.0000 mg | ORAL_TABLET | Freq: Every day | ORAL | Status: DC
  Administered 2012-12-25: 20 mg via ORAL
  Filled 2012-12-24 (×2): qty 1

## 2012-12-24 MED ORDER — FUROSEMIDE 40 MG PO TABS
40.0000 mg | ORAL_TABLET | Freq: Every day | ORAL | Status: DC
  Administered 2012-12-24: 40 mg via ORAL
  Filled 2012-12-24 (×2): qty 1

## 2012-12-24 MED ORDER — INSULIN ASPART 100 UNIT/ML ~~LOC~~ SOLN
0.0000 [IU] | Freq: Three times a day (TID) | SUBCUTANEOUS | Status: DC
  Administered 2012-12-24: 15 [IU] via SUBCUTANEOUS
  Administered 2012-12-25: 8 [IU] via SUBCUTANEOUS
  Administered 2012-12-25: 15 [IU] via SUBCUTANEOUS
  Administered 2012-12-25: 2 [IU] via SUBCUTANEOUS

## 2012-12-24 MED ORDER — LINAGLIPTIN 5 MG PO TABS
5.0000 mg | ORAL_TABLET | Freq: Every day | ORAL | Status: DC
  Administered 2012-12-24 – 2012-12-25 (×2): 5 mg via ORAL
  Filled 2012-12-24 (×2): qty 1

## 2012-12-24 MED ORDER — RIVAROXABAN 20 MG PO TABS
20.0000 mg | ORAL_TABLET | Freq: Every day | ORAL | Status: DC
  Administered 2012-12-24 – 2012-12-25 (×2): 20 mg via ORAL
  Filled 2012-12-24 (×2): qty 1

## 2012-12-24 MED ORDER — INSULIN GLARGINE 100 UNIT/ML ~~LOC~~ SOLN
20.0000 [IU] | Freq: Every day | SUBCUTANEOUS | Status: DC
  Administered 2012-12-24: 20 [IU] via SUBCUTANEOUS
  Filled 2012-12-24 (×2): qty 0.2

## 2012-12-24 MED ORDER — INSULIN ASPART 100 UNIT/ML ~~LOC~~ SOLN
3.0000 [IU] | Freq: Three times a day (TID) | SUBCUTANEOUS | Status: DC
  Administered 2012-12-24 – 2012-12-25 (×4): 3 [IU] via SUBCUTANEOUS

## 2012-12-24 MED ORDER — VITAMIN B-12 1000 MCG PO TABS
1000.0000 ug | ORAL_TABLET | Freq: Every day | ORAL | Status: DC | PRN
  Filled 2012-12-24: qty 1

## 2012-12-24 MED ORDER — SIMVASTATIN 10 MG PO TABS
10.0000 mg | ORAL_TABLET | Freq: Every day | ORAL | Status: DC
  Administered 2012-12-24 – 2012-12-25 (×2): 10 mg via ORAL
  Filled 2012-12-24 (×2): qty 1

## 2012-12-24 MED ORDER — ALBUTEROL SULFATE (5 MG/ML) 0.5% IN NEBU
2.5000 mg | INHALATION_SOLUTION | Freq: Four times a day (QID) | RESPIRATORY_TRACT | Status: DC
  Administered 2012-12-25 (×3): 2.5 mg via RESPIRATORY_TRACT
  Filled 2012-12-24 (×4): qty 0.5

## 2012-12-24 MED ORDER — OLANZAPINE 5 MG PO TABS
5.0000 mg | ORAL_TABLET | Freq: Every day | ORAL | Status: DC
  Administered 2012-12-24: 5 mg via ORAL
  Filled 2012-12-24 (×2): qty 1

## 2012-12-24 NOTE — Progress Notes (Signed)
Utilization review completed.  

## 2012-12-24 NOTE — H&P (Signed)
Cynthia Cameron is an 52 y.o. female.   Chief Complaint: Left sided chest pain and shortness of breath. Also elevated blood sugar per patient.  HPI: 52 years old female with recent prolonged hospitalization from pneumonia, PE, DVT has recurrent left sided chest pain. Some cough. No fever. A;so has blood sugars running high.   Past Medical History  Diagnosis Date  . Allergy   . Depression   . Diabetes mellitus without complication   . Hypertension   . Pneumonia 10/2012  . Shortness of breath       Past Surgical History  Procedure Laterality Date  . Cesarean section    . Cardiac catheterization  2014    Family History  Problem Relation Age of Onset  . Hypertension Father   . Diabetes Mother   . Atrial fibrillation Mother    Social History:  reports that she has never smoked. She has never used smokeless tobacco. She reports that  drinks alcohol. She reports that she does not use illicit drugs.  Allergies: No Known Allergies  Medications Prior to Admission  Medication Sig Dispense Refill  . buPROPion (WELLBUTRIN) 100 MG tablet Take 50 mg by mouth daily.       . chlorpheniramine-HYDROcodone (TUSSIONEX) 10-8 MG/5ML LQCR TAKE 5ML'S BY MOUTH EVERY 12 HOURS AS NEEDED  140 mL  0  . cloNIDine (CATAPRES) 0.2 MG tablet Take 0.2 mg by mouth at bedtime.      . docusate sodium (COLACE) 100 MG capsule Take 100 mg by mouth daily as needed for constipation.      . furosemide (LASIX) 40 MG tablet Take 1 tablet (40 mg total) by mouth daily.  30 tablet  1  . linagliptin (TRADJENTA) 5 MG TABS tablet Take 5 mg by mouth daily.      Marland Kitchen lisinopril (PRINIVIL,ZESTRIL) 20 MG tablet Take 20 mg by mouth daily.      . Multiple Vitamin (MULTIVITAMIN WITH MINERALS) TABS Take 1 tablet by mouth daily.      . nebivolol (BYSTOLIC) 10 MG tablet Take 10 mg by mouth daily.      Marland Kitchen OLANZapine (ZYPREXA) 5 MG tablet Take 5 mg by mouth at bedtime.      . potassium chloride (K-DUR,KLOR-CON) 10 MEQ tablet Take 20 mEq  by mouth 2 (two) times daily.      . pravastatin (PRAVACHOL) 40 MG tablet Take 40 mg by mouth daily.      . predniSONE (DELTASONE) 20 MG tablet Take 1 tablet (20 mg total) by mouth daily with breakfast.  30 tablet  0  . Rivaroxaban (XARELTO) 20 MG TABS Take 1 tablet (20 mg total) by mouth daily.  30 tablet  0  . vitamin B-12 (CYANOCOBALAMIN) 1000 MCG tablet Take 1,000 mcg by mouth daily as needed (for energy).        No results found for this or any previous visit (from the past 48 hour(s)). Dg Chest 2 View  12/23/2012  *RADIOLOGY REPORT*  Clinical Data: Chest pain.  History of pleural effusion.  Shortness of breath.  CHEST - 2 VIEW  Comparison: 12/02/2012.  Findings: Trachea is midline.  Heart size is accentuated by low lung volumes.  There is a small left pleural effusion with left basilar collapse/consolidation.  Minimal right basilar atelectasis.  IMPRESSION: Left basilar collapse/consolidation with small amount of associated left pleural fluid.   Original Report Authenticated By: Leanna Battles, M.D.     @ROS @ Constitutional: Negative for fever and chills.  HENT: Negative.  Respiratory: Negative for cough, chest tightness, + shortness of breath and wheezing.  Cardiovascular: Positive for chest pain. Negative for palpitations.  Gastrointestinal: Negative for nausea, vomiting and abdominal pain.  Musculoskeletal: Negative for back pain and arthralgias.  Skin: Negative.  Neurological: Negative for dizziness, syncope, light-headedness and headaches.  Blood pressure 125/91, pulse 85, temperature 97.8 F (36.6 C), temperature source Oral, resp. rate 18, height 5\' 2"  (1.575 m), weight 75.025 kg (165 lb 6.4 oz), last menstrual period 09/16/2010, SpO2 96.00%. HENT: Head: Normocephalic and atraumatic. Eyes: Manson Passey, Conjunctivae are normal. No scleral icterus.  Neck: supple. FROM.  Cardiovascular: Normal rate, regular rhythm and normal heart sounds. Exam reveals no gallop and no friction rub.  II/VI systolic murmur heard.  Pulmonary/Chest: Decreased air entry at bases. She has mild wheezes.  Abdominal: Soft. Bowel sounds are normal. There is no tenderness.  Neurological: She is alert and oriented to person, place, and time. Moves all 4 extremities.  Skin: Skin is warm and dry.  Psychiatric: She appears anxious.    Assessment/Plan Chest pain, pleuritic.  Recent Pulmonary embolism  Bilateral Pleural effusion, Left larger than before.  Recent Left leg DVT  Possible pneumonia.  Hypertension  CKD, II  Anxiety  DM, II  Place in observation. Control Sugar and chest pain. Home medications.  Mathhew Buysse S 12/24/2012, 2:28 PM

## 2012-12-25 ENCOUNTER — Other Ambulatory Visit: Payer: Self-pay | Admitting: Family Medicine

## 2012-12-25 LAB — CBC
HCT: 32.7 % — ABNORMAL LOW (ref 36.0–46.0)
Hemoglobin: 10.9 g/dL — ABNORMAL LOW (ref 12.0–15.0)
MCV: 77.9 fL — ABNORMAL LOW (ref 78.0–100.0)
RBC: 4.2 MIL/uL (ref 3.87–5.11)
WBC: 14 10*3/uL — ABNORMAL HIGH (ref 4.0–10.5)

## 2012-12-25 LAB — BASIC METABOLIC PANEL
CO2: 31 mEq/L (ref 19–32)
Chloride: 96 mEq/L (ref 96–112)
Creatinine, Ser: 1.75 mg/dL — ABNORMAL HIGH (ref 0.50–1.10)
GFR calc Af Amer: 37 mL/min — ABNORMAL LOW (ref >90)
Potassium: 3.9 mEq/L (ref 3.5–5.1)

## 2012-12-25 LAB — GLUCOSE, CAPILLARY: Glucose-Capillary: 146 mg/dL — ABNORMAL HIGH (ref 70–99)

## 2012-12-25 MED ORDER — INSULIN SYRINGES (DISPOSABLE) U-100 0.5 ML MISC: 3.0000 [IU] | Freq: Three times a day (TID) | Status: AC

## 2012-12-25 MED ORDER — INSULIN GLARGINE 100 UNIT/ML ~~LOC~~ SOLN: 20.0000 [IU] | Freq: Every day | SUBCUTANEOUS | Status: DC

## 2012-12-25 MED ORDER — PREDNISONE 20 MG PO TABS
40.0000 mg | ORAL_TABLET | Freq: Every day | ORAL | Status: DC
  Filled 2012-12-25: qty 2

## 2012-12-25 MED ORDER — ALCOHOL SWABS 70 % PADS: 1.0000 | MEDICATED_PAD | Freq: Three times a day (TID) | Status: DC

## 2012-12-25 MED ORDER — GUAIFENESIN-DM 100-10 MG/5ML PO SYRP: 5.0000 mL | ORAL_SOLUTION | ORAL | Status: DC | PRN

## 2012-12-25 MED ORDER — INSULIN ASPART 100 UNIT/ML ~~LOC~~ SOLN: 0.0000 [IU] | Freq: Three times a day (TID) | SUBCUTANEOUS | Status: DC

## 2012-12-25 MED ORDER — OXYCODONE-ACETAMINOPHEN 5-325 MG PO TABS: 1.0000 | ORAL_TABLET | Freq: Two times a day (BID) | ORAL | Status: DC

## 2012-12-25 NOTE — Discharge Summary (Signed)
Physician Discharge Summary  Patient ID: Cynthia Cameron MRN: [id number] DOB/AGE: [birth year] 52 y.o.  Admit date: 12/24/2012 Discharge date: 12/25/2012  Admission Diagnoses: Chest pain, pleuritic.  Recent Pulmonary embolism  Recent Left leg DVT   Hypertension  CKD, II  Anxiety  DM, II  Discharge Diagnoses:  Principle Problem: * Chest pain, pleuritic*.  Recent Pulmonary embolism  Left lower lung collapse and small effusion  Recent Left leg DVT   Hypertension  CKD, II  Anxiety  DM, II, worsened by prednisone use Renal insufficiency, recent  Discharged Condition: fair  Hospital Course: 52 years old female with recent prolonged hospitalization from pneumonia, PE, DVT has recurrent left sided pleuritic, chest pain without fever. Her high blood sugars were controlled with long acting insulin and short acting insulin using sliding scale. Her prednisone dose was increased back to 20 mg. daily.  She had low oxygen saturation at rest that improved with activity. Patient was advised to use incentive spirometry every hour while awake and see lung doctor as arranged.  Consults: None  Significant Diagnostic Studies: labs: Elevated WBC count to 15.9K, normal Hgb and HCt and platelets count. Near normal electrolytes with mildly elevated BUN/Cr.  Treatments: analgesia: Percocet 5/325 mg. one every 4 hour as needed in hospital and twice daily as needed at home.    Discharge Exam: Blood pressure 103/70, pulse 84, temperature 98 F (36.7 C), temperature source Oral, resp. rate 18, height 5\' 2"  (1.575 m), weight 75.025 kg (165 lb 6.4 oz), last menstrual period 09/16/2010, SpO2 93.00%. HENT: Head: Normocephalic and atraumatic. Eyes: Manson Passey, Conjunctivae are normal. No scleral icterus.  Neck: supple. FROM.  Cardiovascular: Normal rate, regular rhythm and normal heart sounds. Exam reveals no gallop and no friction rub. II/VI systolic murmur heard.  Pulmonary/Chest: Decreased air entry at left  lung base. No wheezing  Abdominal: Soft. Bowel sounds are normal. There is no tenderness.  Neurological: She is alert and oriented to person, place, and time. Moves all 4 extremities.  Skin: Skin is warm and dry.  Psychiatric: She appears anxious.    Disposition: 01-Home or Self Care   Future Appointments Provider Department Dept Phone   01/07/2013 11:45 AM Coralyn Helling, MD [facility] [phone]       Medication List    STOP taking these medications       lisinopril 20 MG tablet  Commonly known as:  PRINIVIL,ZESTRIL     potassium chloride 10 MEQ tablet  Commonly known as:  K-DUR,KLOR-CON      TAKE these medications       Alcohol Swabs 70 % Pads  1 packet by Does not apply route 4 (four) times daily -  with meals and at bedtime.     buPROPion 100 MG tablet  Commonly known as:  WELLBUTRIN  Take 50 mg by mouth daily.     chlorpheniramine-HYDROcodone 10-8 MG/5ML Lqcr  Commonly known as:  TUSSIONEX  Take 5 mLs by mouth every 12 (twelve) hours as needed (for cough/pain).     cloNIDine 0.2 MG tablet  Commonly known as:  CATAPRES  Take 0.2 mg by mouth at bedtime.     ferrous sulfate 325 (65 FE) MG EC tablet  Take 325 mg by mouth daily.     furosemide 40 MG tablet  Commonly known as:  LASIX  Take 1 tablet (40 mg total) by mouth daily.     guaiFENesin-dextromethorphan 100-10 MG/5ML syrup  Commonly known as:  ROBITUSSIN DM  Take 5 mLs by  mouth every 4 (four) hours as needed for cough.     insulin aspart 100 UNIT/ML injection  Commonly known as:  novoLOG  Inject 0-15 Units into the skin 3 (three) times daily with meals.     insulin glargine 100 UNIT/ML injection  Commonly known as:  LANTUS  Inject 0.2 mLs (20 Units total) into the skin at bedtime.     Insulin Syringes (Disposable) U-100 0.5 ML Misc  3-20 Units by Does not apply route 4 (four) times daily -  before meals and at bedtime.     linagliptin 5 MG Tabs tablet  Commonly known as:  TRADJENTA   Take 5 mg by mouth daily.     multivitamin with minerals Tabs  Take 1 tablet by mouth daily.     MUSCLE RUB 10-15 % Crea  Apply 1 application topically 2 (two) times daily.     nebivolol 10 MG tablet  Commonly known as:  BYSTOLIC  Take 10 mg by mouth daily.     OLANZapine 5 MG tablet  Commonly known as:  ZYPREXA  Take 5 mg by mouth at bedtime.     oxyCODONE-acetaminophen 5-325 MG per tablet  Commonly known as:  PERCOCET/ROXICET  Take 1 tablet by mouth 2 (two) times daily.     pravastatin 40 MG tablet  Commonly known as:  PRAVACHOL  Take 40 mg by mouth daily.     predniSONE 20 MG tablet  Commonly known as:  DELTASONE  Take 1 tablet (20 mg total) by mouth daily with breakfast.     Rivaroxaban 20 MG Tabs  Commonly known as:  XARELTO  Take 1 tablet (20 mg total) by mouth daily.           Follow-up Information   Follow up with COPLAND,JESSICA, MD. Schedule an appointment as soon as possible for a visit in 1 month.   Contact information:   [address] [place] [address] [phone]       Follow up with [facility], MD. Schedule an appointment as soon as possible for a visit in 2 weeks.   Contact information:   [address] [place] [address] [phone]       Signed: Ricki Rodriguez 12/25/2012, 5:17 PM

## 2012-12-25 NOTE — Progress Notes (Signed)
SATURATION QUALIFICATIONS: (This note is used to comply with regulatory documentation for home oxygen)  Patient Saturations on Room Air at Rest = 88%  Patient Saturations on Room Air while Ambulating = 90%  Patient Saturations on 2 Liters of oxygen while Ambulating = 95%  Please briefly explain why patient needs home oxygen: While sleeping, patient desats to 86%. Patient has had recurrent pleural effusions. Recent Chest x-ray shows left lower lobe is collapsed

## 2012-12-25 NOTE — Progress Notes (Signed)
Patient desats to 88% room air at rest.  Informed Dr. Algie Coffer and case management of requirement of home Oxygen if patient were to be discharged today.

## 2012-12-25 NOTE — Progress Notes (Signed)
Patient demonstrated drawing up insulin properly and gave self-injection. Will continue to educate patient.

## 2012-12-25 NOTE — Progress Notes (Signed)
Pt blood sugar is 437. Dr Algie Coffer notified and new orders received.

## 2012-12-25 NOTE — Progress Notes (Addendum)
Inpatient Diabetes Program Recommendations  AACE/ADA: New Consensus Statement on Inpatient Glycemic Control (2013)  Target Ranges:  Prepandial:   less than 140 mg/dL      Peak postprandial:   less than 180 mg/dL (1-2 hours)      Critically ill patients:  140 - 180 mg/dL     Results for CYBIL, SENEGAL (MRN [id number]) as of 12/25/2012 12:07  Ref. Range 12/25/2012 07:19 12/25/2012 11:24  Glucose-Capillary Latest Range: 70-99 mg/dL 409 (H) 811 (H)    Patient getting PO Prednisone.  Having elevated postprandial CBGs.  Inpatient Diabetes Program Recommendations Insulin - Meal Coverage: Please increase Novolog meal coverage to 5 units tid with meals while on PO Prednisone (currently getting 3 units tid with meals)  Per RN Victorino Dike, patient will be d/c'd home on insulin per Dr. Algie Coffer.  RN has instructed patient on how to draw up and give an insulin injection.    Spoke with patient about the possibility that she may go home on insulin.  Discussed how a correction scale (SSI) works and how to dose this insulin.  Per RN, patient only to be on insulin temporarily while she is on steroids.  Patient told me she sees an Actor here in town (Dr. Juleen China).  Has a follow-up appointment with Dr. Juleen China in May.  Encouraged patient to tell Dr. Juleen China that Dr. Algie Coffer has recently changed her oral meds to Tradjenta (from [facility] and Metformin) and that he will be sending her home on insulin temporarily.  Will follow. Ambrose Finland RN, MSN, CDE Diabetes Coordinator Inpatient Diabetes Program [phone]

## 2012-12-25 NOTE — Progress Notes (Signed)
Discharge Note: Pt is alert and oriented, VS are stable, denies CP.  Telemetry & IV discontinued.  Discharge instructions reviewed with patient, pt verbalizes understanding.  Wheelchair transportation provided, all belongings with patient  

## 2012-12-25 NOTE — Progress Notes (Signed)
Pt O2 sat was 85 on room air. Placed patient on 4L nasal cannula. Current O2 sat 94%. RN notified.

## 2012-12-26 ENCOUNTER — Telehealth: Payer: Self-pay

## 2012-12-26 ENCOUNTER — Other Ambulatory Visit (HOSPITAL_COMMUNITY): Payer: Self-pay | Admitting: Family Medicine

## 2012-12-26 DIAGNOSIS — I2782 Chronic pulmonary embolism: Secondary | ICD-10-CM

## 2012-12-26 NOTE — Telephone Encounter (Signed)
Pt wants Dr. Patsy Lager to give her a call back Call back number is [phone]

## 2012-12-26 NOTE — Telephone Encounter (Signed)
Called pharmacy and asked them to please send request along to Dr. Craige Cotta, her pulmonologist

## 2012-12-26 NOTE — Telephone Encounter (Signed)
Called [place] back as I saw that she had called me.   Let her know that I sent her xarelto RF request to Dr. Craige Cotta as I suspect he will want to manage her PE.  However, if she has not heard back by tomorrow and is worried about running out I can certainly RF this.  She still has 5 pills left.  Will call her tomorrow

## 2012-12-26 NOTE — Telephone Encounter (Signed)
Left message for patient to call me back. 

## 2012-12-27 NOTE — Telephone Encounter (Signed)
Received message from Dr. Craige Cotta- he does not plan to follow her PE, he thinks Dr. Algie Coffer is doing this. I went ahead and refilled her xarelto, and sent a message to Dr. Algie Coffer: does he need me to do anything else in regard to her PE and how long do we plan to treat her for PE?

## 2012-12-30 ENCOUNTER — Other Ambulatory Visit: Payer: Self-pay | Admitting: Family Medicine

## 2012-12-30 ENCOUNTER — Telehealth: Payer: Self-pay

## 2012-12-30 MED ORDER — GLUCOSE BLOOD VI STRP: ORAL_STRIP | Status: AC

## 2012-12-30 NOTE — Telephone Encounter (Signed)
PT STATES SHE HAVE BEEN TRYING TO GET HER FREE STYLE SCRIPTS AND WAS TOLD IT NEEDED TO BE PRE APPROVED BY THE DR Wynelle Link CALL [phone]   [facility] ON [address]

## 2012-12-30 NOTE — Telephone Encounter (Signed)
Patient was calling about the freestyle test strips, they are resent with dx code on the Rx.

## 2012-12-30 NOTE — Telephone Encounter (Signed)
Resent with dx/ code on the Rx this is what the pharmacy needed.

## 2013-01-02 ENCOUNTER — Other Ambulatory Visit: Payer: Self-pay | Admitting: Physician Assistant

## 2013-01-03 LAB — AFB CULTURE WITH SMEAR (NOT AT ARMC)

## 2013-01-07 ENCOUNTER — Ambulatory Visit (INDEPENDENT_AMBULATORY_CARE_PROVIDER_SITE_OTHER): Payer: 59 | Admitting: Pulmonary Disease

## 2013-01-07 ENCOUNTER — Encounter: Payer: Self-pay | Admitting: Pulmonary Disease

## 2013-01-07 ENCOUNTER — Other Ambulatory Visit (INDEPENDENT_AMBULATORY_CARE_PROVIDER_SITE_OTHER): Payer: 59

## 2013-01-07 VITALS — BP 124/78 | HR 98 | Ht 62.0 in | Wt 165.0 lb

## 2013-01-07 DIAGNOSIS — I2699 Other pulmonary embolism without acute cor pulmonale: Secondary | ICD-10-CM

## 2013-01-07 DIAGNOSIS — I82409 Acute embolism and thrombosis of unspecified deep veins of unspecified lower extremity: Secondary | ICD-10-CM

## 2013-01-07 DIAGNOSIS — J90 Pleural effusion, not elsewhere classified: Secondary | ICD-10-CM

## 2013-01-07 LAB — BASIC METABOLIC PANEL
CO2: 35 mEq/L — ABNORMAL HIGH (ref 19–32)
Chloride: 95 mEq/L — ABNORMAL LOW (ref 96–112)
Creatinine, Ser: 1.5 mg/dL — ABNORMAL HIGH (ref 0.4–1.2)
Potassium: 3.1 mEq/L — ABNORMAL LOW (ref 3.5–5.1)

## 2013-01-07 NOTE — Assessment & Plan Note (Signed)
Will arrange for doppler legs to further assess leg swelling with hx of DVT.

## 2013-01-07 NOTE — Assessment & Plan Note (Addendum)
She is to continue xarelto per Dr. Algie Coffer.  Lupus anti-coagulant from 11/25/12 positive.

## 2013-01-07 NOTE — Patient Instructions (Signed)
Will schedule CT chest and doppler legs and call with results Follow up in 2 months

## 2013-01-07 NOTE — Assessment & Plan Note (Addendum)
Her recent CXR showed persistent effusion and infiltrate on Lt.  Bio-markers from pleural fluid analysis in March 2014 are concerning for possible malignant mesothelioma.  Will repeat CT chest with contrast to further assess.  She is to continue prednisone 20 mg daily until CT chest reviewed.  Depending on CT chest results she may need additional pleural space and/or lung tissue sampling.

## 2013-01-07 NOTE — Progress Notes (Signed)
Chief Complaint  Patient presents with  . Follow-up    Pt was back in the hospital 12/24/12. Pt states she is feeling better since being back out. Breathing is doing ok. Pt is wanting to do a CXR today and would like to do a scan of left leg where she had a blood clot in her leg.     History of Present Illness: Cynthia Cameron is a 52 y.o. female never smoker with pleurisy from Lt lower lobe PNA March 2013 and Lt pleural effusion.  She was back in hospital April 29 to April 30 for chest pain.  She remains on 20 mg prednisone daily.  She is not having chest pain, and her breathing is better since she was in hospital.  She still has occasional cough, but not bringing up sputum.  She remains on xarelto for hx of PE and DVT.  This is managed by Dr. Algie Coffer.  She notes still having leg swelling.  TESTS: CT chest 11/04/12 >> moderate pericardial fluid, borderline LAN, mod Lt effusion with passive ATX LLL  Lt thoracentesis 11/05/12 >> small volume removed, not enough for analysis  Doppler legs 11/06/12 >> DVT Lt posterior tibial and peroneal vein  CT chest 11/08/12 >> small/mod pericardial effusion, Lt > Rt pleural effusions, Rt pulmonary artery PE, ATX LLL  Echo 11/19/12 >> EF 50 to 55%  Lt thoracentesis 11/20/12 >>900 cc turbid fluid, protein 5.1, LDH 149, WBC 2948 (56N, 37L, 51M); Cytology >> mesothelial cells with calretinin, WT-1, cytokeratin 5/6 positive CT chest 11/25/12 >> small Lt > Rt pleural effusions, basilar ATX/consolidation, decreased pericardial effusion   Cynthia Cameron  has a past medical history of Allergy; Hypertension; Shortness of breath; High cholesterol; Pulmonary embolism on right (09/2012); DVT, lower extremity (10/2012); Chest pain; Type II diabetes mellitus; Iron deficiency anemia; Pneumonia (09/2012); Depression; and Bipolar depression.  Cynthia Cameron  has past surgical history that includes Cesarean section (1994; 1996) and Cardiac catheterization (09/2012).  Prior  to Admission medications   Medication Sig Start Date End Date Taking? Authorizing Provider  amLODipine (NORVASC) 5 MG tablet TAKE 1 TABLET BY MOUTH EVERY DAY 01/02/13  Yes Gwenlyn Found Copland, MD  buPROPion (WELLBUTRIN) 100 MG tablet Take 50 mg by mouth daily.    Yes Historical Provider, MD  chlorpheniramine-HYDROcodone (TUSSIONEX) 10-8 MG/5ML LQCR Take 5 mLs by mouth every 12 (twelve) hours as needed (for cough/pain).   Yes Historical Provider, MD  cloNIDine (CATAPRES) 0.2 MG tablet Take 0.2 mg by mouth at bedtime.   Yes Historical Provider, MD  ferrous sulfate 325 (65 FE) MG EC tablet Take 325 mg by mouth daily.   Yes Historical Provider, MD  furosemide (LASIX) 40 MG tablet Take 1 tablet (40 mg total) by mouth daily. 11/28/12  Yes Ricki Rodriguez, MD  glucose blood (FREESTYLE LITE) test strip To check blood sugars tid dx code 250.00 12/30/12  Yes Gwenlyn Found Copland, MD  guaiFENesin-dextromethorphan (ROBITUSSIN DM) 100-10 MG/5ML syrup Take 5 mLs by mouth every 4 (four) hours as needed for cough. 12/25/12  Yes Ricki Rodriguez, MD  insulin aspart (NOVOLOG) 100 UNIT/ML injection Inject 0-15 Units into the skin 3 (three) times daily with meals. 12/25/12  Yes Ricki Rodriguez, MD  insulin glargine (LANTUS) 100 UNIT/ML injection Inject 0.2 mLs (20 Units total) into the skin at bedtime. 12/25/12  Yes Ricki Rodriguez, MD  Insulin Syringes, Disposable, U-100 0.5 ML MISC 3-20 Units by Does not apply route 4 (four) times  daily -  before meals and at bedtime. 12/25/12  Yes Ricki Rodriguez, MD  linagliptin (TRADJENTA) 5 MG TABS tablet Take 5 mg by mouth daily.   Yes Historical Provider, MD  lisinopril (PRINIVIL,ZESTRIL) 20 MG tablet TAKE 1 TABLET (20 MG TOTAL) BY MOUTH DAILY. 12/25/12  Yes Heather M Marte, PA-C  Menthol-Methyl Salicylate (MUSCLE RUB) 10-15 % CREA Apply 1 application topically 2 (two) times daily.   Yes Historical Provider, MD  Multiple Vitamin (MULTIVITAMIN WITH MINERALS) TABS Take 1 tablet by mouth daily.   Yes  Historical Provider, MD  nebivolol (BYSTOLIC) 10 MG tablet Take 10 mg by mouth daily.   Yes Historical Provider, MD  OLANZapine (ZYPREXA) 5 MG tablet Take 5 mg by mouth at bedtime.   Yes Historical Provider, MD  oxyCODONE-acetaminophen (PERCOCET/ROXICET) 5-325 MG per tablet Take 1 tablet by mouth as needed. 12/25/12  Yes Ricki Rodriguez, MD  pravastatin (PRAVACHOL) 40 MG tablet TAKE 1 TABLET (40 MG TOTAL) BY MOUTH DAILY. 12/25/12  Yes Heather M Marte, PA-C  predniSONE (DELTASONE) 20 MG tablet Take 1 tablet (20 mg total) by mouth daily with breakfast. 11/28/12  Yes Ricki Rodriguez, MD  Rivaroxaban (XARELTO) 20 MG TABS TAKE 1 TABLET (20 MG TOTAL) BY MOUTH DAILY**BEGIN AFTER FINISHING 15 MG** 12/26/12  Yes Pearline Cables, MD    No Known Allergies   Physical Exam:  General - No distress ENT - No sinus tenderness, no oral exudate, no LAN Cardiac - s1s2 regular, no murmur Chest - Decreased breath sounds Lt base, no wheeze, no pain on palpation Back - No focal tenderness Abd - Soft, non-tender Ext - Lt > Rt lower ext ankle edema Neuro - Normal strength Skin - No rashes Psych - normal mood, and behavior   Assessment/Plan:  Coralyn Helling, MD [facility] Pager:  [phone]

## 2013-01-09 ENCOUNTER — Ambulatory Visit (INDEPENDENT_AMBULATORY_CARE_PROVIDER_SITE_OTHER)
Admission: RE | Admit: 2013-01-09 | Discharge: 2013-01-09 | Disposition: A | Discharge status: Home / Self Care | Payer: 59 | Source: Ambulatory Visit | Attending: Pulmonary Disease | Admitting: Pulmonary Disease

## 2013-01-09 DIAGNOSIS — J90 Pleural effusion, not elsewhere classified: Secondary | ICD-10-CM

## 2013-01-09 MED ORDER — IOHEXOL 300 MG/ML  SOLN
65.0000 mL | Freq: Once | INTRAMUSCULAR | Status: AC | PRN
  Administered 2013-01-09: 65 mL via INTRAVENOUS

## 2013-01-10 ENCOUNTER — Encounter (INDEPENDENT_AMBULATORY_CARE_PROVIDER_SITE_OTHER): Payer: 59

## 2013-01-10 ENCOUNTER — Telehealth: Payer: Self-pay | Admitting: Pulmonary Disease

## 2013-01-10 DIAGNOSIS — I82409 Acute embolism and thrombosis of unspecified deep veins of unspecified lower extremity: Secondary | ICD-10-CM

## 2013-01-10 DIAGNOSIS — I87009 Postthrombotic syndrome without complications of unspecified extremity: Secondary | ICD-10-CM

## 2013-01-10 NOTE — Telephone Encounter (Signed)
Ct Chest W Contrast  01/09/2013   *RADIOLOGY REPORT*  Clinical Data: Recent hospitalization for pneumonia and pulmonary emboli.  Persistent left lower lobe infiltrate and effusion.  CT CHEST WITH CONTRAST  Technique:  Multidetector CT imaging of the chest was performed following the standard protocol during bolus administration of intravenous contrast.  Contrast: 65mL OMNIPAQUE IOHEXOL 300 MG/ML  SOLN  Comparison: Chest radiograph 12/24/2012 and CT chest 11/25/2012.  Findings: No pathologically enlarged mediastinal, hilar or axillary lymph nodes.  Heart size normal.  No pericardial effusion. Probable 1.2 cm sebaceous cyst in the posterior soft tissues on the right.  Minimal scattered scarring.  Lungs are otherwise clear.  No pleural fluid.  Airway is unremarkable.  No worrisome lytic or sclerotic lesions.  IMPRESSION: No acute findings.   Original Report Authenticated By: Leanna Battles, M.D.    Results of CT chest reviewed with pt.  Explained that CT chest was completely normal.  Explained that her pleural fluid analysis from March 2014 showed positive markers for mesothelioma, but this is of uncertain significance with normal appearing CT chest.  Will monitor clinically for now, and then decide if/when to repeat chest imaging studies.

## 2013-01-17 ENCOUNTER — Ambulatory Visit (INDEPENDENT_AMBULATORY_CARE_PROVIDER_SITE_OTHER): Payer: 59 | Admitting: Family Medicine

## 2013-01-17 VITALS — BP 128/82 | HR 84 | Temp 98.4°F | Resp 17 | Ht 63.0 in | Wt 167.0 lb

## 2013-01-17 DIAGNOSIS — E876 Hypokalemia: Secondary | ICD-10-CM

## 2013-01-17 DIAGNOSIS — I2782 Chronic pulmonary embolism: Secondary | ICD-10-CM

## 2013-01-17 LAB — BASIC METABOLIC PANEL
BUN: 16 mg/dL (ref 6–23)
CO2: 28 mEq/L (ref 19–32)
Glucose, Bld: 174 mg/dL — ABNORMAL HIGH (ref 70–99)
Potassium: 3.4 mEq/L — ABNORMAL LOW (ref 3.5–5.3)

## 2013-01-17 NOTE — Progress Notes (Addendum)
[facility] [address], [place] [address] [phone]  Date:  01/17/2013   Name:  Cynthia Cameron   DOB:  Jun 26, 1961   MRN:  [id number]  PCP:  Abbe Amsterdam, MD    Chief Complaint: Follow-up   History of Present Illness:  Cynthia Cameron is a 52 y.o. very pleasant female patient who presents with the following:  She is here today for follow-up.  She was re hospitalized at the end of April with a DVT/ PE and pleural effusion.  She is now doing better again, is doing spirometry and walking several times a day to keep her lungs open.  Dr. Craige Cotta is following her from a pulmonary standpoint.   She is taking the xarelto.  She is not sure how long she is supposed to be on this.  She will see Dr. Algie Coffer in about 2 weeks and will ask him about her xarelto use- ?when should she stop this. I refilled this for her the last time it was due.    She is just here for a follow-up today; she was instructed to follow-up a month after hospital discharge.  She feels that she is doing well, she can walk 1 or 2 miles without difficulty.    She is taking prednisone right now which is causing her glucose to run into the 200s.  Dr. Juleen China has increased her SSI, and he takes lantus once a day as well.  She is taking 20 mg of prednisone today.  Her K was recently incrased from 20 to 40 because she was low at her last blood draw.  (Her K was 3.1 on 01/07/13)  Her legs feel ok, she does have some swelling in her ankles but thinks this is due to taking in too much salt recently  Patient Active Problem List   Diagnosis Date Noted  . Pulmonary embolism 11/07/2012  . DVT (deep venous thrombosis) 11/07/2012  . Pericardial effusion 11/06/2012  . Pleural effusion 11/05/2012  . Diabetes mellitus without complication   . Hypertension   . Depression     Past Medical History  Diagnosis Date  . Allergy   . Hypertension   . Shortness of breath   . High cholesterol   . Pulmonary  embolism on right 09/2012  . DVT, lower extremity 10/2012    "2" (12/24/2012)  . Chest pain     "since 09/2012; hurts all the time" (12/24/2012)  . Type II diabetes mellitus   . Iron deficiency anemia   . Pneumonia 09/2012  . Depression   . Bipolar depression     Past Surgical History  Procedure Laterality Date  . Cesarean section  1994; 1996  . Cardiac catheterization  09/2012    History  Substance Use Topics  . Smoking status: Never Smoker   . Smokeless tobacco: Never Used  . Alcohol Use: Yes     Comment: 12/24/2012 "mixed drink maybe q 6 months or more"    Family History  Problem Relation Age of Onset  . Hypertension Father   . Diabetes Mother   . Atrial fibrillation Mother     No Known Allergies  Medication list has been reviewed and updated.  Current Outpatient Prescriptions on File Prior to Visit  Medication Sig Dispense Refill  . amLODipine (NORVASC) 5 MG tablet TAKE 1 TABLET BY MOUTH EVERY DAY  30 tablet  4  . buPROPion (WELLBUTRIN) 100 MG tablet Take 50 mg by mouth daily.       Marland Kitchen  chlorpheniramine-HYDROcodone (TUSSIONEX) 10-8 MG/5ML LQCR Take 5 mLs by mouth every 12 (twelve) hours as needed (for cough/pain).      . cloNIDine (CATAPRES) 0.2 MG tablet Take 0.2 mg by mouth at bedtime.      . ferrous sulfate 325 (65 FE) MG EC tablet Take 325 mg by mouth daily.      . furosemide (LASIX) 40 MG tablet Take 1 tablet (40 mg total) by mouth daily.  30 tablet  1  . glucose blood (FREESTYLE LITE) test strip To check blood sugars tid dx code 250.00  100 each  3  . guaiFENesin-dextromethorphan (ROBITUSSIN DM) 100-10 MG/5ML syrup Take 5 mLs by mouth every 4 (four) hours as needed for cough.  118 mL  1  . insulin glargine (LANTUS) 100 UNIT/ML injection Inject 0.2 mLs (20 Units total) into the skin at bedtime.  10 mL  1  . Insulin Syringes, Disposable, U-100 0.5 ML MISC 3-20 Units by Does not apply route 4 (four) times daily -  before meals and at bedtime.  120 each  1  . lisinopril  (PRINIVIL,ZESTRIL) 20 MG tablet TAKE 1 TABLET (20 MG TOTAL) BY MOUTH DAILY.  90 tablet  1  . Menthol-Methyl Salicylate (MUSCLE RUB) 10-15 % CREA Apply 1 application topically 2 (two) times daily.      . Multiple Vitamin (MULTIVITAMIN WITH MINERALS) TABS Take 1 tablet by mouth daily.      . nebivolol (BYSTOLIC) 10 MG tablet Take 10 mg by mouth daily.      Marland Kitchen OLANZapine (ZYPREXA) 5 MG tablet Take 5 mg by mouth at bedtime.      Marland Kitchen oxyCODONE-acetaminophen (PERCOCET/ROXICET) 5-325 MG per tablet Take 1 tablet by mouth as needed.      . pravastatin (PRAVACHOL) 40 MG tablet TAKE 1 TABLET (40 MG TOTAL) BY MOUTH DAILY.  90 tablet  1  . predniSONE (DELTASONE) 20 MG tablet Take 1 tablet (20 mg total) by mouth daily with breakfast.  30 tablet  0  . Rivaroxaban (XARELTO) 20 MG TABS TAKE 1 TABLET (20 MG TOTAL) BY MOUTH DAILY**BEGIN AFTER FINISHING 15 MG**  30 tablet  0  . insulin aspart (NOVOLOG) 100 UNIT/ML injection Inject 0-15 Units into the skin 3 (three) times daily with meals.  1 vial  1  . linagliptin (TRADJENTA) 5 MG TABS tablet Take 5 mg by mouth daily.       No current facility-administered medications on file prior to visit.    Review of Systems:  As per HPI- otherwise negative.   Physical Examination: Filed Vitals:   01/17/13 1053  BP: 128/82  Pulse: 84  Temp: 98.4 F (36.9 C)  Resp: 17   Filed Vitals:   01/17/13 1053  Height: 5\' 3"  (1.6 m)  Weight: 167 lb (75.751 kg)   Body mass index is 29.59 kg/(m^2). Ideal Body Weight: Weight in (lb) to have BMI = 25: 140.8  GEN: WDWN, NAD, Non-toxic, A & O x 3, looks well HEENT: Atraumatic, Normocephalic. Neck supple. No masses, No LAD. Ears and Nose: No external deformity. CV: RRR, No M/G/R. No JVD. No thrill. No extra heart sounds. PULM: CTA B, good air movement, no wheezes, crackles, rhonchi. No retractions. No resp. distress. No accessory muscle use. EXTR: No c/c.  Trace edema bilaterally, no calf tenderness or pain NEURO Normal gait.   PSYCH: Normally interactive. Conversant. Not depressed or anxious appearing.  Calm demeanor.    Assessment and Plan: Chronic pulmonary embolism  Hypokalemia - Plan: Basic  metabolic panel  Recheck K today and follow-up with her.  PE is resolved as per her recent CT scan. She will continue to see Drs Craige Cotta and Algie Coffer.    Signed Abbe Amsterdam, MD  Called to go over her labs.  K is now nearly normal, creatinine is better.  She is having labs again on Wednesday and I asked her to have her K checked again. She will let us know if there is anything we can do to help and will continue to follow- up with her specialists.

## 2013-01-17 NOTE — Patient Instructions (Addendum)
I will be in touch when your labs come in.   Please ask Dr. Algie Coffer about your xarelto- ? How long are you going to take it.  Let me know the answer so I will know how long to continue to prescribe this for you.

## 2013-01-21 ENCOUNTER — Telehealth: Payer: Self-pay | Admitting: Pulmonary Disease

## 2013-01-21 NOTE — Telephone Encounter (Signed)
Doppler legs 01/13/13 >> no DVT Rt leg.  Small residual area of thrombus in Lt mid peroneal vein, no other clots Lt leg.  Results d/w pt over phone.  She is to continue with xarelto.

## 2013-01-29 ENCOUNTER — Other Ambulatory Visit (HOSPITAL_COMMUNITY): Payer: Self-pay | Admitting: Family Medicine

## 2013-02-18 ENCOUNTER — Telehealth: Payer: Self-pay

## 2013-02-18 NOTE — Telephone Encounter (Signed)
Pt is having routine dental cleaning July 14,will she need to discontinue blood thinner a few days before?   Best phone [phone] or [phone]

## 2013-02-18 NOTE — Telephone Encounter (Signed)
Who is controlling the zarelto , you refilled once, but do not appear to be managing this, I will have her call there.

## 2013-02-19 NOTE — Telephone Encounter (Signed)
Called and LMOM- I would think she could keep taking it for just a cleaning, but please check with her heart doctor Dr. Algie Coffer.    Thanks! JC

## 2013-03-01 ENCOUNTER — Other Ambulatory Visit (HOSPITAL_COMMUNITY): Payer: Self-pay | Admitting: Family Medicine

## 2013-03-02 NOTE — Telephone Encounter (Signed)
Please call Dr. Roseanne Kaufman office and find out when she is to stop taking the Xarelto and who is to be managing/refilling this medication? We filled it for her last time. It was documented that she was to have follow up with Dr. Algie Coffer 2 weeks after that appointment with Dr. Patsy Lager but I do not see that that appointment occurred.

## 2013-03-03 ENCOUNTER — Telehealth: Payer: Self-pay

## 2013-03-03 NOTE — Telephone Encounter (Signed)
Sent this to pharmacy to contact Dr Algie Coffer, called patient. Left message for patient to call me back about this.

## 2013-03-03 NOTE — Telephone Encounter (Signed)
Patient is returning a call to our office, [phone]

## 2013-03-05 NOTE — Telephone Encounter (Signed)
Yes, I called her about the Zarelto. Left message for her, advising if she did not get this medication from Dr Algie Coffer, she is to call me back.

## 2013-03-11 ENCOUNTER — Ambulatory Visit: Payer: 59 | Admitting: Pulmonary Disease

## 2013-03-18 ENCOUNTER — Other Ambulatory Visit: Payer: Self-pay | Admitting: Cardiovascular Disease

## 2013-03-18 ENCOUNTER — Ambulatory Visit
Admission: RE | Admit: 2013-03-18 | Discharge: 2013-03-18 | Disposition: A | Discharge status: Home / Self Care | Payer: 59 | Source: Ambulatory Visit | Attending: Cardiovascular Disease | Admitting: Cardiovascular Disease

## 2013-03-18 DIAGNOSIS — R52 Pain, unspecified: Secondary | ICD-10-CM

## 2013-03-18 DIAGNOSIS — R079 Chest pain, unspecified: Secondary | ICD-10-CM

## 2013-05-01 ENCOUNTER — Other Ambulatory Visit: Payer: Self-pay | Admitting: Cardiovascular Disease

## 2013-05-01 ENCOUNTER — Ambulatory Visit
Admission: RE | Admit: 2013-05-01 | Discharge: 2013-05-01 | Disposition: A | Discharge status: Home / Self Care | Payer: 59 | Source: Ambulatory Visit | Attending: Cardiovascular Disease | Admitting: Cardiovascular Disease

## 2013-05-01 DIAGNOSIS — R079 Chest pain, unspecified: Secondary | ICD-10-CM

## 2013-05-01 DIAGNOSIS — R0789 Other chest pain: Secondary | ICD-10-CM

## 2013-05-01 DIAGNOSIS — R0602 Shortness of breath: Secondary | ICD-10-CM

## 2013-05-13 ENCOUNTER — Encounter: Payer: Self-pay | Admitting: Critical Care Medicine

## 2013-05-13 ENCOUNTER — Other Ambulatory Visit (INDEPENDENT_AMBULATORY_CARE_PROVIDER_SITE_OTHER): Payer: 59

## 2013-05-13 ENCOUNTER — Ambulatory Visit (INDEPENDENT_AMBULATORY_CARE_PROVIDER_SITE_OTHER): Payer: 59 | Admitting: Critical Care Medicine

## 2013-05-13 VITALS — BP 136/94 | HR 98 | Temp 99.0°F | Ht 63.0 in | Wt 184.8 lb

## 2013-05-13 DIAGNOSIS — R079 Chest pain, unspecified: Secondary | ICD-10-CM

## 2013-05-13 DIAGNOSIS — I82409 Acute embolism and thrombosis of unspecified deep veins of unspecified lower extremity: Secondary | ICD-10-CM

## 2013-05-13 DIAGNOSIS — I319 Disease of pericardium, unspecified: Secondary | ICD-10-CM

## 2013-05-13 DIAGNOSIS — I3139 Other pericardial effusion (noninflammatory): Secondary | ICD-10-CM

## 2013-05-13 DIAGNOSIS — I82403 Acute embolism and thrombosis of unspecified deep veins of lower extremity, bilateral: Secondary | ICD-10-CM

## 2013-05-13 DIAGNOSIS — I313 Pericardial effusion (noninflammatory): Secondary | ICD-10-CM

## 2013-05-13 LAB — BASIC METABOLIC PANEL
BUN: 13 mg/dL (ref 6–23)
Calcium: 9.4 mg/dL (ref 8.4–10.5)
Chloride: 105 mEq/L (ref 96–112)
Creatinine, Ser: 1.4 mg/dL — ABNORMAL HIGH (ref 0.4–1.2)
GFR: 51.79 mL/min — ABNORMAL LOW (ref >60.00)

## 2013-05-13 NOTE — Progress Notes (Signed)
Subjective:    Patient ID: Cynthia Cameron, female    DOB: [birth year], 52 y.o.   MRN: [id number]  HPI Chief Complaint  Patient presents with  . Follow-up    Pt was back in the hospital 12/24/12. Pt states she is feeling better since being back out. Breathing is doing ok. Pt is wanting to do a CXR today and would like to do a scan of left leg where she had a blood clot in her leg.     History of Present Illness: Cynthia Cameron is a 52 y.o. female never smoker with pleurisy from Lt lower lobe PNA March 2013 and Lt pleural effusion.  She was back in hospital April 29 to April 30 for chest pain.  She remains on 20 mg prednisone daily.  She is not having chest pain, and her breathing is better since she was in hospital.  She still has occasional cough, but not bringing up sputum.  She remains on xarelto for hx of PE and DVT.  This is managed by Dr. Algie Coffer.  She notes still having leg swelling.   05/13/2013 Chief Complaint  Patient presents with  . Acute Visit    VS pt.  c/o chest paint/tightness and SOB x 2 wks.  Very little cough.  No f/c/s.  Pt notes chest pain for two weeks and goes into the neck.  Hx of PE and clots in L leg and R lung. Pt is still on xarelto. Prior hx of pna No real cough. Pain is tightness, hurts if lay flat. Legs do not swell.,  No f/c/s. No mucus. No pcp, sees [place] Pain is worse if inhales.      TESTS: CT chest 11/04/12 >> moderate pericardial fluid, borderline LAN, mod Lt effusion with passive ATX LLL  Lt thoracentesis 11/05/12 >> small volume removed, not enough for analysis  Doppler legs 11/06/12 >> DVT Lt posterior tibial and peroneal vein  CT chest 11/08/12 >> small/mod pericardial effusion, Lt > Rt pleural effusions, Rt pulmonary artery PE, ATX LLL  Echo 11/19/12 >> EF 50 to 55%  Lt thoracentesis 11/20/12 >>900 cc turbid fluid, protein 5.1, LDH 149, WBC 2948 (56N, 37L, 52M); Cytology >> mesothelial cells with calretinin, WT-1, cytokeratin 5/6  positive CT chest 11/25/12 >> small Lt > Rt pleural effusions, basilar ATX/consolidation, decreased pericardial effusion     Review of Systems     Objective:   Physical Exam Filed Vitals:   05/13/13 1505  BP: 136/94  Pulse: 98  Temp: 99 F (37.2 C)  TempSrc: Oral  Height: 5\' 3"  (1.6 m)  Weight: 83.825 kg (184 lb 12.8 oz)  SpO2: 95%    Gen: Pleasant, well-nourished, in no distress,  normal affect  ENT: No lesions,  mouth clear,  oropharynx clear, no postnasal drip  Neck: No JVD, no TMG, no carotid bruits  Lungs: No use of accessory muscles, no dullness to percussion, clear without rales or rhonchi  Cardiovascular: RRR, heart sounds normal, no murmur or gallops, no peripheral edema  Abdomen: soft and NT, no HSM,  BS normal  Musculoskeletal: No deformities, no cyanosis or clubbing  Neuro: alert, non focal  Skin: Warm, no lesions or rashes  Ct Angio Chest Pe W/cm &/or Wo Cm  05/14/2013   *RADIOLOGY REPORT*  Clinical Data: Chest pain.  History of recent pulmonary emboli.  CT ANGIOGRAPHY CHEST  Technique:  Multidetector CT imaging of the chest using the standard protocol during bolus administration of intravenous contrast. Multiplanar reconstructed images  including MIPs were obtained and reviewed to evaluate the vascular anatomy.  Contrast: 80mL OMNIPAQUE IOHEXOL 350 MG/ML SOLN  Comparison: CT scans dated 05/15 and 11/25/2012  Findings: There are no pulmonary emboli, infiltrates, or effusions. Heart size and pulmonary vascularity are normal.  No acute osseous abnormality.  There is slight thickening of the fissures on the left and minimal linear scarring or atelectasis in the right middle lobe.  The visualized portion of the upper abdomen is normal.  IMPRESSION: No pulmonary emboli or other significant abnormalities.   Original Report Authenticated By: Francene Boyers, M.D.          Assessment & Plan:   Chest pain Chest pain syndrome,  Doubt PE and CT angio neg for  PE Doubt acute medical issue. Normal exam Plan Venous doppler u/s, echo Note CT angio Neg   Updated Medication List Outpatient Encounter Prescriptions as of 05/13/2013  Medication Sig Dispense Refill  . amLODipine (NORVASC) 5 MG tablet TAKE 1 TABLET BY MOUTH EVERY DAY  30 tablet  4  . buPROPion (WELLBUTRIN) 100 MG tablet Take 50 mg by mouth daily.       . chlorpheniramine-HYDROcodone (TUSSIONEX) 10-8 MG/5ML LQCR Take 5 mLs by mouth every 12 (twelve) hours as needed (for cough/pain).      . cloNIDine (CATAPRES) 0.2 MG tablet Take 0.2 mg by mouth at bedtime.      . ferrous sulfate 325 (65 FE) MG EC tablet Take 325 mg by mouth daily.      . furosemide (LASIX) 40 MG tablet Take 1 tablet (40 mg total) by mouth daily.  30 tablet  1  . glucose blood (FREESTYLE LITE) test strip To check blood sugars tid dx code 250.00  100 each  3  . guaiFENesin-dextromethorphan (ROBITUSSIN DM) 100-10 MG/5ML syrup Take 5 mLs by mouth every 4 (four) hours as needed for cough.  118 mL  1  . insulin aspart (NOVOLOG) 100 UNIT/ML injection Inject 0-15 Units into the skin 3 (three) times daily with meals.  1 vial  1  . insulin glargine (LANTUS) 100 UNIT/ML injection Inject 0.2 mLs (20 Units total) into the skin at bedtime.  10 mL  1  . Insulin Syringes, Disposable, U-100 0.5 ML MISC 3-20 Units by Does not apply route 4 (four) times daily -  before meals and at bedtime.  120 each  1  . linagliptin (TRADJENTA) 5 MG TABS tablet Take 5 mg by mouth daily.      . Liraglutide (VICTOZA [place]) Inject 0.6 Units into the skin daily.      Marland Kitchen lisinopril (PRINIVIL,ZESTRIL) 20 MG tablet TAKE 1 TABLET (20 MG TOTAL) BY MOUTH DAILY.  90 tablet  1  . Menthol-Methyl Salicylate (MUSCLE RUB) 10-15 % CREA Apply 1 application topically as needed.       . Multiple Vitamin (MULTIVITAMIN WITH MINERALS) TABS Take 1 tablet by mouth daily.      . nebivolol (BYSTOLIC) 10 MG tablet Take 10 mg by mouth daily.      Marland Kitchen OLANZapine (ZYPREXA) 5 MG tablet Take 5  mg by mouth at bedtime.      Marland Kitchen oxyCODONE-acetaminophen (PERCOCET/ROXICET) 5-325 MG per tablet Take 1 tablet by mouth as needed.      . pravastatin (PRAVACHOL) 40 MG tablet TAKE 1 TABLET (40 MG TOTAL) BY MOUTH DAILY.  90 tablet  1  . XARELTO 20 MG TABS TAKE 1 TABLET (20 MG TOTAL) BY MOUTH DAILY**BEGIN AFTER FINISHING 15 MG**  30 tablet  0  . [  DISCONTINUED] predniSONE (DELTASONE) 20 MG tablet Take 1 tablet (20 mg total) by mouth daily with breakfast.  30 tablet  0   No facility-administered encounter medications on file as of 05/13/2013.

## 2013-05-13 NOTE — Patient Instructions (Addendum)
A CT scan of chest, echo cardiogram and venous doppler ultrasound will be obtained No change in medication Return Dr Craige Cotta when above completed

## 2013-05-14 ENCOUNTER — Ambulatory Visit (INDEPENDENT_AMBULATORY_CARE_PROVIDER_SITE_OTHER)
Admission: RE | Admit: 2013-05-14 | Discharge: 2013-05-14 | Disposition: A | Discharge status: Home / Self Care | Payer: 59 | Source: Ambulatory Visit | Attending: Critical Care Medicine | Admitting: Critical Care Medicine

## 2013-05-14 DIAGNOSIS — R079 Chest pain, unspecified: Secondary | ICD-10-CM | POA: Insufficient documentation

## 2013-05-14 MED ORDER — IOHEXOL 350 MG/ML SOLN
80.0000 mL | Freq: Once | INTRAVENOUS | Status: AC | PRN
  Administered 2013-05-14: 80 mL via INTRAVENOUS

## 2013-05-14 NOTE — Progress Notes (Signed)
Quick Note:  Called, spoke with family member. Was advised pt is at work. He will ask pt to call office back. I have also lmomtcb on her cell #. ______

## 2013-05-14 NOTE — Assessment & Plan Note (Signed)
Chest pain syndrome,  Doubt PE and CT angio neg for PE Doubt acute medical issue. Normal exam Plan Venous doppler u/s, echo Note CT angio Neg

## 2013-05-15 ENCOUNTER — Telehealth: Payer: Self-pay | Admitting: Pulmonary Disease

## 2013-05-15 NOTE — Telephone Encounter (Signed)
Notes Recorded by Raford Pitcher, RN on 05/14/2013 at 4:58 PM Called, spoke with family member. Was advised pt is at work. He will ask pt to call office back. I have also lmomtcb on her cell #. ------  Notes Recorded by Storm Frisk, MD on 05/14/2013 at 2:25 PM Call pt and tell CT scan NORMAL. No pulmonary embolii or any cause of her chest pain seen Pt advised. Carron Curie, CMA

## 2013-05-16 ENCOUNTER — Encounter (INDEPENDENT_AMBULATORY_CARE_PROVIDER_SITE_OTHER): Payer: 59

## 2013-05-16 DIAGNOSIS — I82403 Acute embolism and thrombosis of unspecified deep veins of lower extremity, bilateral: Secondary | ICD-10-CM

## 2013-05-16 DIAGNOSIS — M7989 Other specified soft tissue disorders: Secondary | ICD-10-CM

## 2013-05-16 DIAGNOSIS — M79609 Pain in unspecified limb: Secondary | ICD-10-CM

## 2013-05-20 ENCOUNTER — Ambulatory Visit (INDEPENDENT_AMBULATORY_CARE_PROVIDER_SITE_OTHER)
Admission: RE | Admit: 2013-05-20 | Discharge: 2013-05-20 | Disposition: A | Discharge status: Home / Self Care | Payer: 59 | Source: Ambulatory Visit | Attending: Pulmonary Disease | Admitting: Pulmonary Disease

## 2013-05-20 ENCOUNTER — Telehealth: Payer: Self-pay | Admitting: Pulmonary Disease

## 2013-05-20 ENCOUNTER — Encounter: Payer: Self-pay | Admitting: Pulmonary Disease

## 2013-05-20 ENCOUNTER — Other Ambulatory Visit (INDEPENDENT_AMBULATORY_CARE_PROVIDER_SITE_OTHER): Payer: 59

## 2013-05-20 ENCOUNTER — Ambulatory Visit (INDEPENDENT_AMBULATORY_CARE_PROVIDER_SITE_OTHER): Payer: 59 | Admitting: Pulmonary Disease

## 2013-05-20 VITALS — BP 132/80 | HR 98 | Temp 97.8°F

## 2013-05-20 DIAGNOSIS — R091 Pleurisy: Secondary | ICD-10-CM

## 2013-05-20 DIAGNOSIS — I2699 Other pulmonary embolism without acute cor pulmonale: Secondary | ICD-10-CM

## 2013-05-20 DIAGNOSIS — R0781 Pleurodynia: Secondary | ICD-10-CM

## 2013-05-20 DIAGNOSIS — R071 Chest pain on breathing: Secondary | ICD-10-CM

## 2013-05-20 DIAGNOSIS — J90 Pleural effusion, not elsewhere classified: Secondary | ICD-10-CM

## 2013-05-20 LAB — CBC WITH DIFFERENTIAL/PLATELET
Basophils Relative: 1.1 % (ref 0.0–3.0)
Eosinophils Relative: 1.3 % (ref 0.0–5.0)
HCT: 38.5 % (ref 36.0–46.0)
Hemoglobin: 13 g/dL (ref 12.0–15.0)
MCV: 79.3 fl (ref 78.0–100.0)
Monocytes Absolute: 0.7 10*3/uL (ref 0.1–1.0)
Monocytes Relative: 5.5 % (ref 3.0–12.0)
Neutro Abs: 9.8 10*3/uL — ABNORMAL HIGH (ref 1.4–7.7)
Neutrophils Relative %: 79.3 % — ABNORMAL HIGH (ref 43.0–77.0)
RBC: 4.85 Mil/uL (ref 3.87–5.11)
WBC: 12.4 10*3/uL — ABNORMAL HIGH (ref 4.5–10.5)

## 2013-05-20 LAB — COMPREHENSIVE METABOLIC PANEL
Albumin: 3.5 g/dL (ref 3.5–5.2)
Alkaline Phosphatase: 87 U/L (ref 39–117)
BUN: 18 mg/dL (ref 6–23)
Calcium: 10.1 mg/dL (ref 8.4–10.5)
Chloride: 100 mEq/L (ref 96–112)
Creatinine, Ser: 1.8 mg/dL — ABNORMAL HIGH (ref 0.4–1.2)
Glucose, Bld: 121 mg/dL — ABNORMAL HIGH (ref 70–99)
Potassium: 4.1 mEq/L (ref 3.5–5.1)

## 2013-05-20 LAB — SEDIMENTATION RATE: Sed Rate: 90 mm/hr — ABNORMAL HIGH (ref 0–22)

## 2013-05-20 LAB — RHEUMATOID FACTOR: Rhuematoid fact SerPl-aCnc: 12 IU/mL (ref <=14)

## 2013-05-20 MED ORDER — PREDNISONE (PAK) 10 MG PO TABS: ORAL_TABLET | ORAL | Status: DC

## 2013-05-20 NOTE — Progress Notes (Signed)
Chief Complaint  Patient presents with  . Acute Visit    Reports chest pain x3 weeks. SOB is present. Denies coughing or wheezing. Had Cynthia, CT Angio and venous dopplar done and all were normal    History of Present Illness: Cynthia Cameron is a 52 y.o. female never smoker with pleurisy from Lt lower lobe PNA March 2013 and Lt pleural effusion.  She developed recurrent chest pain about 3 weeks ago.  She was seen by Dr. Delford Field.  She had CT chest which was unremarkable, and doppler legs with was negative.  Her symptoms started shortly after she was taken off prednisone.    She has been feeling short of breath, and her pain starts in her chest and goes around to her back and shoulders.  The pain has been constant.  She has tried hydrocodone w/o relief.  She is not having cough, wheeze, sputum, or fever.  She denies joint swelling or skin rashes.  She denies reflux.    TESTS: CT chest 11/04/12 >> moderate pericardial fluid, borderline LAN, mod Lt effusion with passive ATX LLL  Lt thoracentesis 11/05/12 >> small volume removed, not enough for analysis  Doppler legs 11/06/12 >> DVT Lt posterior tibial and peroneal vein  CT chest 11/08/12 >> small/mod pericardial effusion, Lt > Rt pleural effusions, Rt pulmonary artery PE, ATX LLL  Cynthia 11/19/12 >> EF 50 to 55%  Lt thoracentesis 11/20/12 >>900 cc turbid fluid, protein 5.1, LDH 149, WBC 2948 (56N, 37L, 58M); Cytology >> mesothelial cells with calretinin, WT-1, cytokeratin 5/6 positive CT chest 11/25/12 >> small Lt > Rt pleural effusions, basilar ATX/consolidation, decreased pericardial effusion CT chest 05/15/13 >> ATX RML, no PE Doppler legs b/l 05/16/13 >> no DVT   TONEA LEIPHART  has a past medical history of Allergy; Hypertension; Shortness of breath; High cholesterol; Pulmonary embolism on right (09/2012); DVT, lower extremity (10/2012); Chest pain; Type II diabetes mellitus; Iron deficiency anemia; Pneumonia (09/2012); Depression; and Bipolar  depression.  BONNE WHACK  has past surgical history that includes Cesarean section (1994; 1996) and Cardiac catheterization (09/2012).  Prior to Admission medications   Medication Sig Start Date End Date Taking? Authorizing Provider  amLODipine (NORVASC) 5 MG tablet TAKE 1 TABLET BY MOUTH EVERY DAY 01/02/13  Yes Gwenlyn Found Copland, MD  buPROPion (WELLBUTRIN) 100 MG tablet Take 50 mg by mouth daily.    Yes Historical Provider, MD  chlorpheniramine-HYDROcodone (TUSSIONEX) 10-8 MG/5ML LQCR Take 5 mLs by mouth every 12 (twelve) hours as needed (for cough/pain).   Yes Historical Provider, MD  cloNIDine (CATAPRES) 0.2 MG tablet Take 0.2 mg by mouth at bedtime.   Yes Historical Provider, MD  ferrous sulfate 325 (65 FE) MG EC tablet Take 325 mg by mouth daily.   Yes Historical Provider, MD  furosemide (LASIX) 40 MG tablet Take 1 tablet (40 mg total) by mouth daily. 11/28/12  Yes Ricki Rodriguez, MD  glucose blood (FREESTYLE LITE) test strip To check blood sugars tid dx code 250.00 12/30/12  Yes Gwenlyn Found Copland, MD  guaiFENesin-dextromethorphan (ROBITUSSIN DM) 100-10 MG/5ML syrup Take 5 mLs by mouth every 4 (four) hours as needed for cough. 12/25/12  Yes Ricki Rodriguez, MD  insulin aspart (NOVOLOG) 100 UNIT/ML injection Inject 0-15 Units into the skin 3 (three) times daily with meals. 12/25/12  Yes Ricki Rodriguez, MD  insulin glargine (LANTUS) 100 UNIT/ML injection Inject 0.2 mLs (20 Units total) into the skin at bedtime. 12/25/12  Yes Ajay S  Algie Coffer, MD  Insulin Syringes, Disposable, U-100 0.5 ML MISC 3-20 Units by Does not apply route 4 (four) times daily -  before meals and at bedtime. 12/25/12  Yes Ricki Rodriguez, MD  linagliptin (TRADJENTA) 5 MG TABS tablet Take 5 mg by mouth daily.   Yes Historical Provider, MD  lisinopril (PRINIVIL,ZESTRIL) 20 MG tablet TAKE 1 TABLET (20 MG TOTAL) BY MOUTH DAILY. 12/25/12  Yes Heather M Marte, PA-C  Menthol-Methyl Salicylate (MUSCLE RUB) 10-15 % CREA Apply 1 application  topically 2 (two) times daily.   Yes Historical Provider, MD  Multiple Vitamin (MULTIVITAMIN WITH MINERALS) TABS Take 1 tablet by mouth daily.   Yes Historical Provider, MD  nebivolol (BYSTOLIC) 10 MG tablet Take 10 mg by mouth daily.   Yes Historical Provider, MD  OLANZapine (ZYPREXA) 5 MG tablet Take 5 mg by mouth at bedtime.   Yes Historical Provider, MD  oxyCODONE-acetaminophen (PERCOCET/ROXICET) 5-325 MG per tablet Take 1 tablet by mouth as needed. 12/25/12  Yes Ricki Rodriguez, MD  pravastatin (PRAVACHOL) 40 MG tablet TAKE 1 TABLET (40 MG TOTAL) BY MOUTH DAILY. 12/25/12  Yes Heather M Marte, PA-C  predniSONE (DELTASONE) 20 MG tablet Take 1 tablet (20 mg total) by mouth daily with breakfast. 11/28/12  Yes Ricki Rodriguez, MD  Rivaroxaban (XARELTO) 20 MG TABS TAKE 1 TABLET (20 MG TOTAL) BY MOUTH DAILY**BEGIN AFTER FINISHING 15 MG** 12/26/12  Yes Pearline Cables, MD    No Known Allergies   Physical Exam:  General - No distress ENT - No sinus tenderness, no oral exudate, no LAN Cardiac - s1s2 regular, no murmur Chest - Decreased breath sounds Lt base, no wheeze, no pain on palpation, decreased respiratory excursion due to chest discomfort Back - No focal tenderness Abd - Soft, non-tender Ext - no edema Neuro - Normal strength Skin - No rashes Psych - normal mood, and behavior   Assessment/Plan:  Coralyn Helling, MD [place] [facility] Pager:  [phone]

## 2013-05-20 NOTE — Patient Instructions (Signed)
Prednisone 10 mg pills >> 2 pills daily for 3 days, 1.5 pills daily for 3 days, 1 pill daily for 3 days, then 0.5 pills daily Chest xray and lab tests today Follow up in one week with Dr. Craige Cotta or [facility]

## 2013-05-20 NOTE — Progress Notes (Signed)
Quick Note:  Called, spoke with pt. Informed her of results and recs per Dr. Wright. She verbalized understanding and voiced no further questions or concerns at this time. ______ 

## 2013-05-20 NOTE — Progress Notes (Signed)
Quick Note:  Notify the patient that the venous doppler ultrasound is NORMAL, No blood clots No change in medications are recommended. Continue current meds as prescribed at last office visit ______

## 2013-05-20 NOTE — Telephone Encounter (Signed)
I spoke with the pt and she states she is still having chest pain and SOB. The pain is worse when she takes a deep breath. I spoke with Dr. Craige Cotta and he advised to add the pt onto his schedule. Carron Curie, CMA

## 2013-05-21 LAB — ANA W/REFLEX IF POSITIVE: Anti Nuclear Antibody(ANA): NEGATIVE

## 2013-05-22 ENCOUNTER — Ambulatory Visit: Payer: 59 | Admitting: Pulmonary Disease

## 2013-05-22 ENCOUNTER — Telehealth: Payer: Self-pay | Admitting: Pulmonary Disease

## 2013-05-22 DIAGNOSIS — R0781 Pleurodynia: Secondary | ICD-10-CM | POA: Insufficient documentation

## 2013-05-22 NOTE — Telephone Encounter (Signed)
Pt is aware of results. 

## 2013-05-22 NOTE — Assessment & Plan Note (Signed)
Her symptom recurrence coincides with when she was taken off prednisone.  Will resume prednisone.  Will repeat chest xray today, and check labs for ESR, ANA, RF.

## 2013-05-22 NOTE — Assessment & Plan Note (Signed)
She has hx of positive lupus anti-coagulant.  She remains on xarelto per Dr. Algie Coffer.  Recent chest imaging studies were negative for recurrent PE.

## 2013-05-22 NOTE — Telephone Encounter (Signed)
Dg Chest 2 View  05/20/2013   CLINICAL DATA:  Upper chest pain, back pain and neck pain for 3 weeks, chest pressure and tightness, hypertension, diabetes  EXAM: CHEST  2 VIEW  COMPARISON:  05/01/2013  FINDINGS: Enlargement of cardiac silhouette.  Tortuous aorta.  Pulmonary vascularity normal.  Bibasilar atelectasis.  Upper lungs clear.  No pleural effusion, pneumothorax or acute osseous findings.  IMPRESSION: Enlargement of cardiac silhouette.  Bibasilar atelectasis.   Electronically Signed   By: Ulyses Southward M.D.   On: 05/20/2013 16:44   CBC    Component Value Date/Time   WBC 12.4* 05/20/2013 1525   RBC 4.85 05/20/2013 1525   HGB 13.0 05/20/2013 1525   HCT 38.5 05/20/2013 1525   PLT 439.0* 05/20/2013 1525   MCV 79.3 05/20/2013 1525   MCHC 33.9 05/20/2013 1525   RDW 15.4* 05/20/2013 1525   LYMPHSABS 1.6 05/20/2013 1525   MONOABS 0.7 05/20/2013 1525   EOSABS 0.2 05/20/2013 1525   BASOSABS 0.1 05/20/2013 1525    CMP     Component Value Date/Time   NA 138 05/20/2013 1525   K 4.1 05/20/2013 1525   CL 100 05/20/2013 1525   CO2 31 05/20/2013 1525   GLUCOSE 121* 05/20/2013 1525   BUN 18 05/20/2013 1525   CREATININE 1.8* 05/20/2013 1525   CREATININE 1.35* 01/17/2013 1157   CALCIUM 10.1 05/20/2013 1525   PROT 7.9 05/20/2013 1525   ALBUMIN 3.5 05/20/2013 1525   AST 15 05/20/2013 1525   ALT 17 05/20/2013 1525   ALKPHOS 87 05/20/2013 1525   BILITOT 0.5 05/20/2013 1525   Lab Results  Component Value Date   ESRSEDRATE 90* 05/20/2013   Lab Results  Component Value Date   ANA Negative 05/20/2013   RF 12 05/20/2013    Will have my nurse inform pt that chest xray looked okay.  Lab tests show inflammation which should improve with prednisone.  Will discuss in more detail at next ROV.  No change to current treatment plan.

## 2013-05-22 NOTE — Assessment & Plan Note (Signed)
She had Lt pleural effusion in March 2014 with biomarkers concerning for mesothelioma on fluid analysis >> multiple follow chest imaging studies have been negative for fluid recurrence.

## 2013-05-27 ENCOUNTER — Ambulatory Visit (INDEPENDENT_AMBULATORY_CARE_PROVIDER_SITE_OTHER): Payer: 59 | Admitting: Adult Health

## 2013-05-27 ENCOUNTER — Encounter: Payer: Self-pay | Admitting: Adult Health

## 2013-05-27 VITALS — BP 120/84 | HR 82 | Temp 98.8°F | Ht 63.0 in | Wt 184.8 lb

## 2013-05-27 DIAGNOSIS — R071 Chest pain on breathing: Secondary | ICD-10-CM

## 2013-05-27 DIAGNOSIS — R0781 Pleurodynia: Secondary | ICD-10-CM

## 2013-05-27 NOTE — Patient Instructions (Addendum)
Decrease Prednisone 5mg  daily on 05/29/13 then remain on this dose for 3 weeks then 5mg  every other day until seen back in office  Follow up Dr. Craige Cotta  In 4 weeks and As needed   Get flu shot as planned and As needed

## 2013-05-27 NOTE — Progress Notes (Signed)
Reviewed and agree with assessment/plan. 

## 2013-05-27 NOTE — Progress Notes (Signed)
Subjective:    Patient ID: Cynthia Cameron, female    DOB: February 26, 1961, 52 y.o.   MRN: [id number]  HPI 52 y.o. female never smoker with pleurisy from Lt lower lobe PNA March 2013 and Lt pleural effusion.  05/27/2013 Follow up  Pt returns for follow up for pleurisy.  reports symptoms are much improved since last ov.  currently taking 1 tab daily of prednisone (10mg  daily )  Seen 1 week ago , pleuritic chest pain returned shortly after stopping prednisone. She was started back on steroid taper.  CXR showed no acute issues.  ANA /RA factor neg ,, ESR tr down 132>90  No fever, discolored mucus , hemoptyiss, rash, joint swelling.  She says she is feeling better, chest wall pain has resolved with steroids.      TESTS: CT chest 11/04/12 >> moderate pericardial fluid, borderline LAN, mod Lt effusion with passive ATX LLL  Lt thoracentesis 11/05/12 >> small volume removed, not enough for analysis  Doppler legs 11/06/12 >> DVT Lt posterior tibial and peroneal vein  CT chest 11/08/12 >> small/mod pericardial effusion, Lt > Rt pleural effusions, Rt pulmonary artery PE, ATX LLL  Echo 11/19/12 >> EF 50 to 55%  Lt thoracentesis 11/20/12 >>900 cc turbid fluid, protein 5.1, LDH 149, WBC 2948 (56N, 37L, 5M); Cytology >> mesothelial cells with calretinin, WT-1, cytokeratin 5/6 positive CT chest 11/25/12 >> small Lt > Rt pleural effusions, basilar ATX/consolidation, decreased pericardial effusion CT chest 05/15/13 >> ATX RML, no PE Doppler legs b/l 05/16/13 >> no DVT   Review of Systems Constitutional:   No  weight loss, night sweats,  Fevers, chills, fatigue, or  lassitude.  HEENT:   No headaches,  Difficulty swallowing,  Tooth/dental problems, or  Sore throat,                No sneezing, itching, ear ache, nasal congestion, post nasal drip,   CV:  No   Orthopnea, PND, swelling in lower extremities, anasarca, dizziness, palpitations, syncope.   GI  No heartburn, indigestion, abdominal pain, nausea,  vomiting, diarrhea, change in bowel habits, loss of appetite, bloody stools.   Resp: No excess mucus, no productive cough,  No non-productive cough,  No coughing up of blood.  No change in color of mucus.  No wheezing.  No chest wall deformity  Skin: no rash or lesions.  GU: no dysuria, change in color of urine, no urgency or frequency.  No flank pain, no hematuria   MS:  No joint pain or swelling.  No decreased range of motion.  No back pain.  Psych:  No change in mood or affect. No depression or anxiety.  No memory loss.         Objective:   Physical Exam GEN: A/Ox3; pleasant , NAD, well nourished   HEENT:  [place]/AT,  EACs-clear, TMs-wnl, NOSE-clear, THROAT-clear, no lesions, no postnasal drip or exudate noted.   NECK:  Supple w/ fair ROM; no JVD; normal carotid impulses w/o bruits; no thyromegaly or nodules palpated; no lymphadenopathy.  RESP  Clear  P & A; w/o, wheezes/ rales/ or rhonchi.no accessory muscle use, no dullness to percussion  CARD:  RRR, no m/r/g  , no peripheral edema, pulses intact, no cyanosis or clubbing.  GI:   Soft & nt; nml bowel sounds; no organomegaly or masses detected.  Musco: Warm bil, no deformities or joint swelling noted.   Neuro: alert, no focal deficits noted.    Skin: Warm, no lesions or rashes  Assessment & Plan:

## 2013-05-27 NOTE — Assessment & Plan Note (Addendum)
Pleuritic chest wall pain improved with steroid  ANA /RA factor neg  ESR remains elevated.  Recent CT chest neg for PE, ATX RML , pl effusion resolved.  Remains on Xarelto for hx of PE  Will cont on steroids , wean slow to 5mg .  Every other prior to next ov.   Plan  Decrease Prednisone 5mg  daily on 05/29/13 then remain on this dose for 3 weeks then 5mg  every other day until seen back in office  Follow up Dr. Craige Cotta  In 4 weeks and As needed   Get flu shot as planned and As needed

## 2013-06-12 ENCOUNTER — Other Ambulatory Visit: Payer: Self-pay | Admitting: Physician Assistant

## 2013-06-13 ENCOUNTER — Other Ambulatory Visit: Payer: Self-pay | Admitting: Physician Assistant

## 2013-06-14 ENCOUNTER — Other Ambulatory Visit: Payer: Self-pay | Admitting: Physician Assistant

## 2013-07-03 ENCOUNTER — Other Ambulatory Visit: Payer: Self-pay

## 2013-07-09 IMAGING — CR DG CHEST 2V
2 series · 2 of 2 positions shown · non-contrast
Comparison: 12/02/2012.

CLINICAL DATA: Chest pain.  History of pleural effusion.  Shortness
of breath.

CHEST - 2 VIEW

[w chest lat]
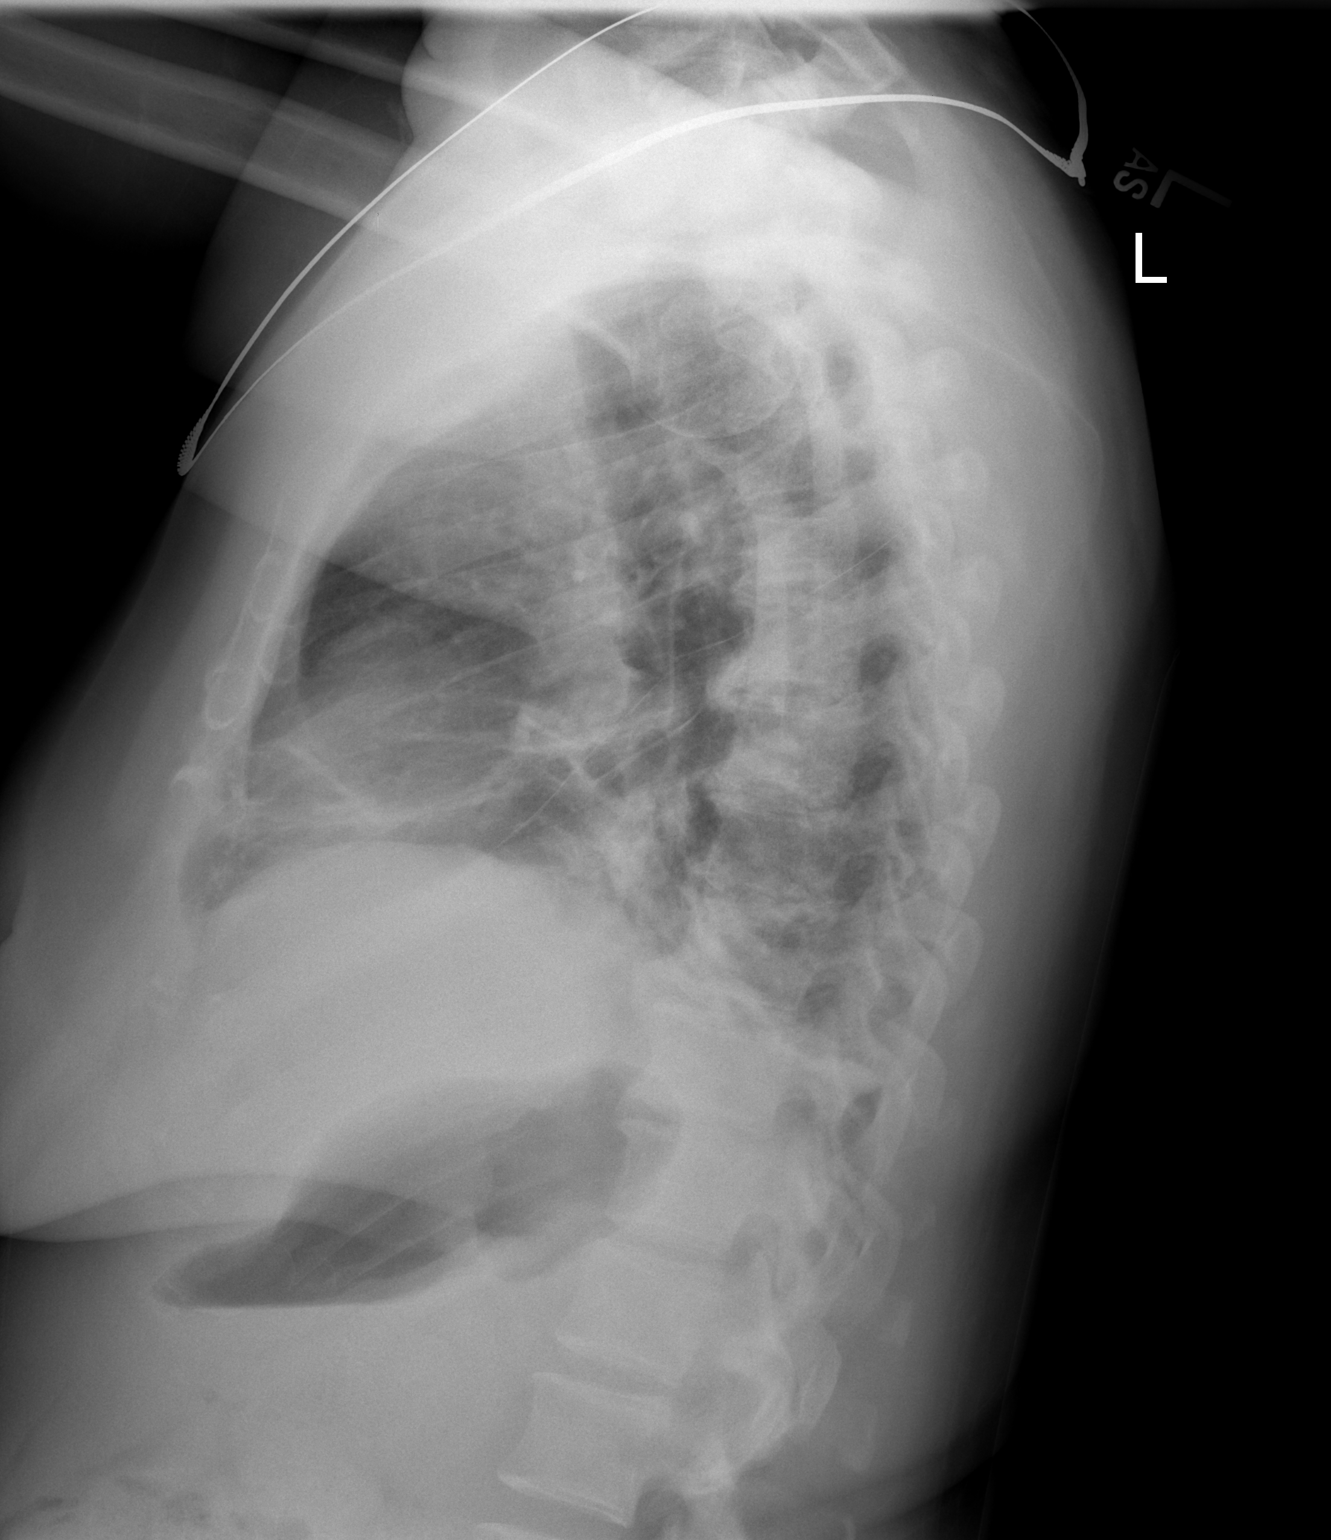

[w chest ap]
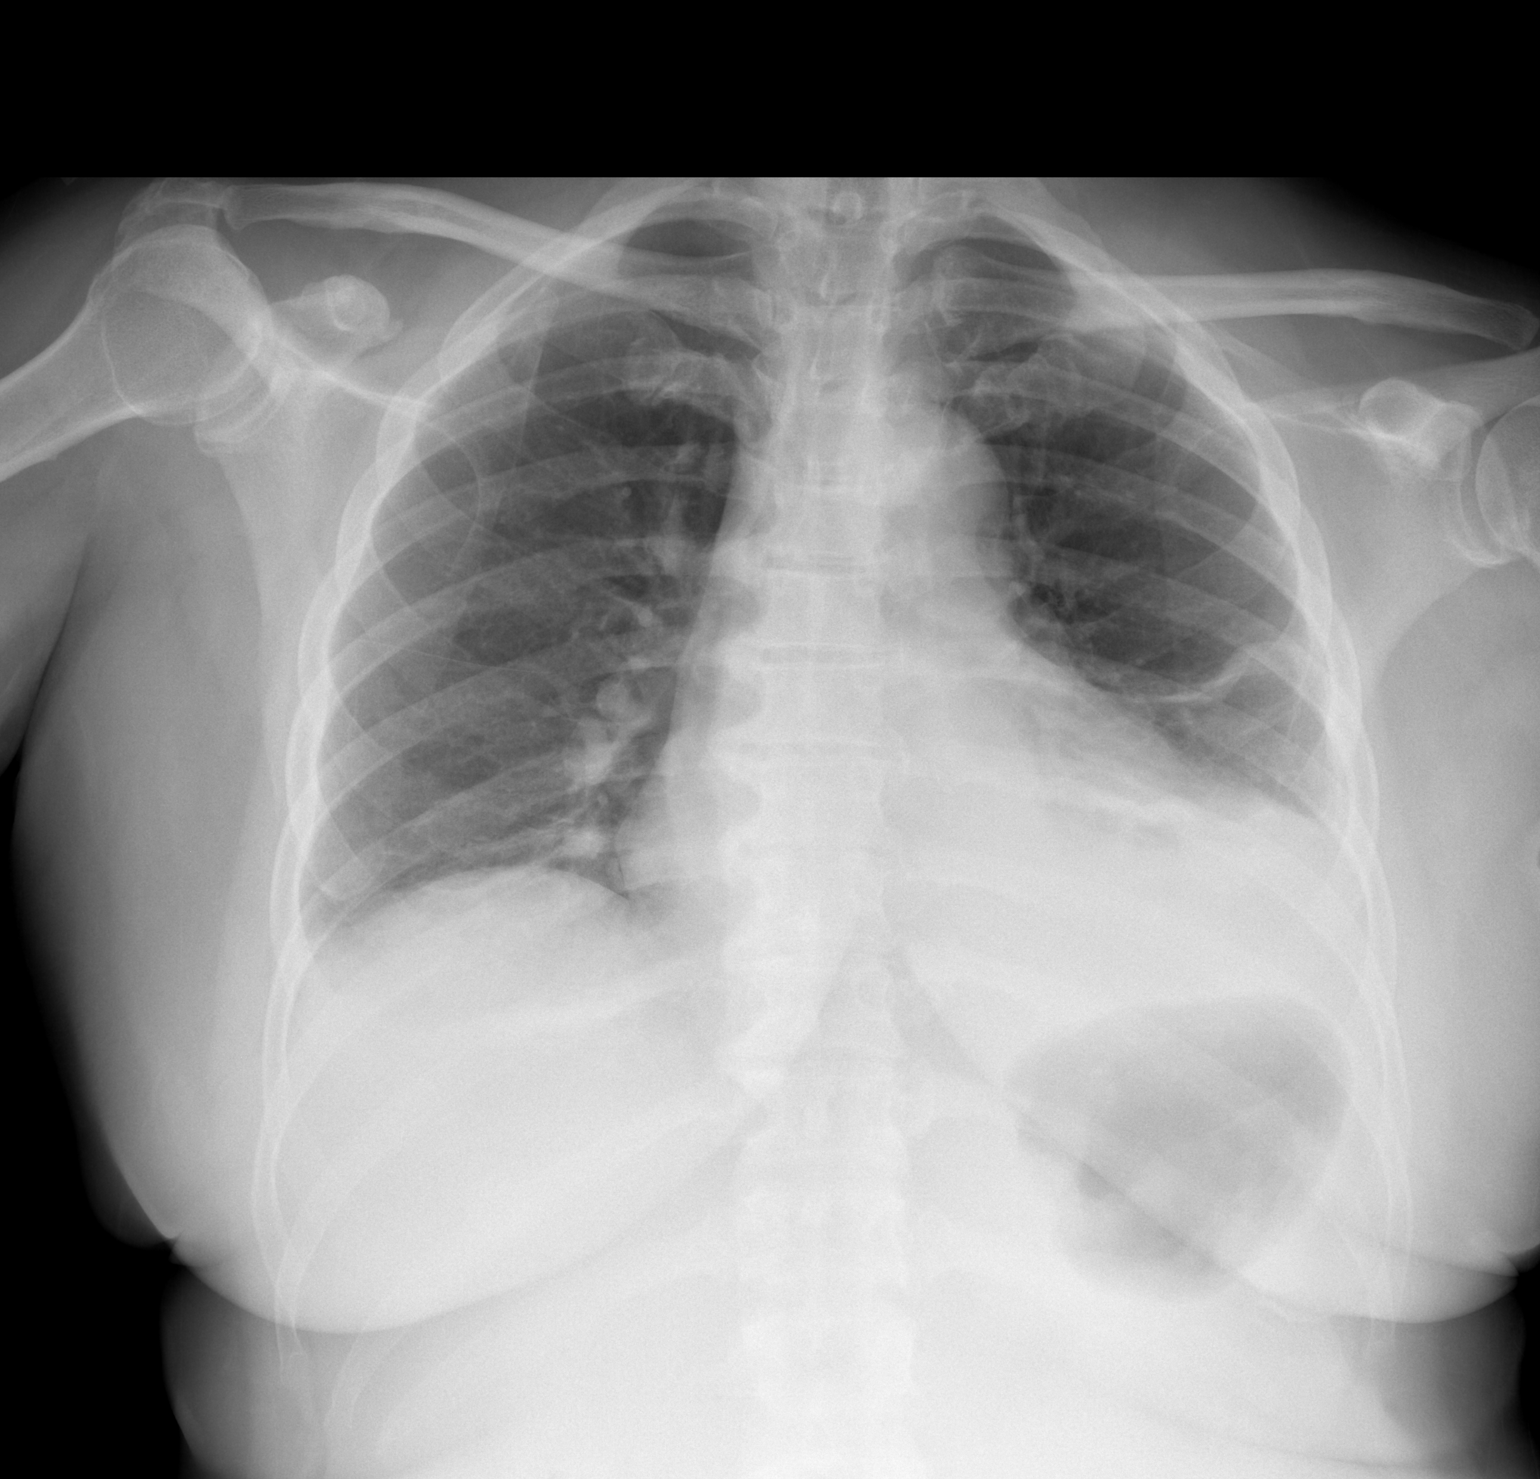

[2 of 2 positions shown; findings below may reference images not displayed]

FINDINGS: Trachea is midline.  Heart size is accentuated by low
lung volumes.  There is a small left pleural effusion with left
basilar collapse/consolidation.  Minimal right basilar atelectasis.
IMPRESSION: Left basilar collapse/consolidation with small amount of associated
left pleural fluid.

## 2013-07-14 ENCOUNTER — Encounter: Payer: Self-pay | Admitting: Pulmonary Disease

## 2013-07-14 ENCOUNTER — Ambulatory Visit (INDEPENDENT_AMBULATORY_CARE_PROVIDER_SITE_OTHER): Payer: 59 | Admitting: Pulmonary Disease

## 2013-07-14 VITALS — BP 130/86 | HR 86 | Ht 64.0 in | Wt 190.0 lb

## 2013-07-14 DIAGNOSIS — I2699 Other pulmonary embolism without acute cor pulmonale: Secondary | ICD-10-CM

## 2013-07-14 DIAGNOSIS — J90 Pleural effusion, not elsewhere classified: Secondary | ICD-10-CM

## 2013-07-14 DIAGNOSIS — R0781 Pleurodynia: Secondary | ICD-10-CM

## 2013-07-14 DIAGNOSIS — R071 Chest pain on breathing: Secondary | ICD-10-CM

## 2013-07-14 MED ORDER — PREDNISONE 2.5 MG PO TABS: ORAL_TABLET | ORAL | Status: DC

## 2013-07-14 NOTE — Progress Notes (Signed)
Chief Complaint  Patient presents with  . Pleurisy    4 week follow up.     History of Present Illness: Cynthia Cameron is a 52 y.o. female never smoker with pleurisy from Lt lower lobe PNA March 2014 and Lt pleural effusion.  She remains of prednisone 5 mg daily.  She did not have any difficulty with reduction in dose of prednisone.  She is not having any difficulty with chest pain.  She has occasional cough with clear phlegm.  She denies fever, joint pain, rashes, or hemoptysis.  She is followed by Dr. Briant Cedar with [facility] for her hypertension.  TESTS: CT chest 11/04/12 >> moderate pericardial fluid, borderline LAN, mod Lt effusion with passive ATX LLL  Lt thoracentesis 11/05/12 >> small volume removed, not enough for analysis  Doppler legs 11/06/12 >> DVT Lt posterior tibial and peroneal vein  CT chest 11/08/12 >> small/mod pericardial effusion, Lt > Rt pleural effusions, Rt pulmonary artery PE, ATX LLL  Echo 11/19/12 >> EF 50 to 55%  Lt thoracentesis 11/20/12 >>900 cc turbid fluid, protein 5.1, LDH 149, WBC 2948 (56N, 37L, 58M); Cytology >> mesothelial cells with calretinin, WT-1, cytokeratin 5/6 positive CT chest 11/25/12 >> small Lt > Rt pleural effusions, basilar ATX/consolidation, decreased pericardial effusion CT chest 05/15/13 >> ATX RML, no PE Doppler legs b/l 05/16/13 >> no DVT Labs 05/20/13 >> ESR 90, ANA negative, RF 12   Cynthia Cameron  has a past medical history of Allergy; Hypertension; Shortness of breath; High cholesterol; Pulmonary embolism on right (09/2012); DVT, lower extremity (10/2012); Chest pain; Type II diabetes mellitus; Iron deficiency anemia; Pneumonia (09/2012); Depression; and Bipolar depression.  Cynthia Cameron  has past surgical history that includes Cesarean section (1994; 1996) and Cardiac catheterization (09/2012).  Prior to Admission medications   Medication Sig Start Date End Date Taking? Authorizing Provider  amLODipine (NORVASC) 5 MG  tablet TAKE 1 TABLET BY MOUTH EVERY DAY 01/02/13  Yes Gwenlyn Found Copland, MD  buPROPion (WELLBUTRIN) 100 MG tablet Take 50 mg by mouth daily.    Yes Historical Provider, MD  chlorpheniramine-HYDROcodone (TUSSIONEX) 10-8 MG/5ML LQCR Take 5 mLs by mouth every 12 (twelve) hours as needed (for cough/pain).   Yes Historical Provider, MD  cloNIDine (CATAPRES) 0.2 MG tablet Take 0.2 mg by mouth at bedtime.   Yes Historical Provider, MD  ferrous sulfate 325 (65 FE) MG EC tablet Take 325 mg by mouth daily.   Yes Historical Provider, MD  furosemide (LASIX) 40 MG tablet Take 1 tablet (40 mg total) by mouth daily. 11/28/12  Yes Ricki Rodriguez, MD  glucose blood (FREESTYLE LITE) test strip To check blood sugars tid dx code 250.00 12/30/12  Yes Gwenlyn Found Copland, MD  guaiFENesin-dextromethorphan (ROBITUSSIN DM) 100-10 MG/5ML syrup Take 5 mLs by mouth every 4 (four) hours as needed for cough. 12/25/12  Yes Ricki Rodriguez, MD  insulin aspart (NOVOLOG) 100 UNIT/ML injection Inject 0-15 Units into the skin 3 (three) times daily with meals. 12/25/12  Yes Ricki Rodriguez, MD  insulin glargine (LANTUS) 100 UNIT/ML injection Inject 0.2 mLs (20 Units total) into the skin at bedtime. 12/25/12  Yes Ricki Rodriguez, MD  Insulin Syringes, Disposable, U-100 0.5 ML MISC 3-20 Units by Does not apply route 4 (four) times daily -  before meals and at bedtime. 12/25/12  Yes Ricki Rodriguez, MD  linagliptin (TRADJENTA) 5 MG TABS tablet Take 5 mg by mouth daily.   Yes Historical Provider, MD  lisinopril (PRINIVIL,ZESTRIL) 20 MG tablet TAKE 1 TABLET (20 MG TOTAL) BY MOUTH DAILY. 12/25/12  Yes Heather M Marte, PA-C  Menthol-Methyl Salicylate (MUSCLE RUB) 10-15 % CREA Apply 1 application topically 2 (two) times daily.   Yes Historical Provider, MD  Multiple Vitamin (MULTIVITAMIN WITH MINERALS) TABS Take 1 tablet by mouth daily.   Yes Historical Provider, MD  nebivolol (BYSTOLIC) 10 MG tablet Take 10 mg by mouth daily.   Yes Historical Provider, MD   OLANZapine (ZYPREXA) 5 MG tablet Take 5 mg by mouth at bedtime.   Yes Historical Provider, MD  oxyCODONE-acetaminophen (PERCOCET/ROXICET) 5-325 MG per tablet Take 1 tablet by mouth as needed. 12/25/12  Yes Ricki Rodriguez, MD  pravastatin (PRAVACHOL) 40 MG tablet TAKE 1 TABLET (40 MG TOTAL) BY MOUTH DAILY. 12/25/12  Yes Heather M Marte, PA-C  predniSONE (DELTASONE) 20 MG tablet Take 1 tablet (20 mg total) by mouth daily with breakfast. 11/28/12  Yes Ricki Rodriguez, MD  Rivaroxaban (XARELTO) 20 MG TABS TAKE 1 TABLET (20 MG TOTAL) BY MOUTH DAILY**BEGIN AFTER FINISHING 15 MG** 12/26/12  Yes Pearline Cables, MD    No Known Allergies   Physical Exam:  General - No distress ENT - No sinus tenderness, no oral exudate, no LAN Cardiac - s1s2 regular, no murmur Chest - no wheeze, no pain on palpation Back - No focal tenderness Abd - Soft, non-tender Ext - no edema Neuro - Normal strength Skin - No rashes Psych - normal mood, and behavior   05/20/2013    CLINICAL DATA:  Upper chest pain, back pain and neck pain for 3 weeks, chest pressure and tightness, hypertension, diabetes   EXAM: CHEST  2 VIEW   COMPARISON:  05/01/2013   FINDINGS:  Enlargement of cardiac silhouette.  Tortuous aorta.  Pulmonary vascularity normal.  Bibasilar atelectasis.  Upper lungs clear.  No pleural effusion, pneumothorax or acute osseous findings.   IMPRESSION:  Enlargement of cardiac silhouette.  Bibasilar atelectasis.    Electronically Signed   By: Ulyses Southward M.D.   On: 05/20/2013 16:44     Assessment/Plan:  Coralyn Helling, MD [place] Pager:  [phone]

## 2013-07-14 NOTE — Patient Instructions (Signed)
Prednisone 2.5 mg pill >> use two pills every other day for 3 weeks >> if okay, then change to one pill every other day for 3 weeks >> if okay, then stop prednisone in 6 weeks Follow up in 8 weeks

## 2013-07-21 NOTE — Assessment & Plan Note (Signed)
She has hx of positive lupus anti-coagulant.  She is to continue xarelto per her PCP.

## 2013-07-21 NOTE — Assessment & Plan Note (Signed)
Improved.  Will continue to gradual when off prednisone.  Will plan to repeat imaging studies once off prednisone, or if her symptoms recur.

## 2013-07-21 NOTE — Assessment & Plan Note (Signed)
Likely inflammatory.  Resolved on most recent CXR from September 2014 with use of prednisone.

## 2013-09-07 ENCOUNTER — Telehealth: Payer: Self-pay

## 2013-09-07 ENCOUNTER — Ambulatory Visit: Payer: 59

## 2013-09-07 ENCOUNTER — Ambulatory Visit (HOSPITAL_COMMUNITY)
Admission: RE | Admit: 2013-09-07 | Discharge: 2013-09-07 | Disposition: A | Discharge status: Home / Self Care | Payer: 59 | Source: Ambulatory Visit | Attending: Family Medicine | Admitting: Family Medicine

## 2013-09-07 ENCOUNTER — Ambulatory Visit (INDEPENDENT_AMBULATORY_CARE_PROVIDER_SITE_OTHER): Payer: 59 | Admitting: Family Medicine

## 2013-09-07 ENCOUNTER — Encounter (HOSPITAL_COMMUNITY): Payer: Self-pay

## 2013-09-07 VITALS — BP 140/80 | HR 67 | Temp 98.1°F | Resp 16 | Ht 63.0 in | Wt 194.0 lb

## 2013-09-07 DIAGNOSIS — I10 Essential (primary) hypertension: Secondary | ICD-10-CM

## 2013-09-07 DIAGNOSIS — I2699 Other pulmonary embolism without acute cor pulmonale: Secondary | ICD-10-CM

## 2013-09-07 DIAGNOSIS — S0990XA Unspecified injury of head, initial encounter: Secondary | ICD-10-CM | POA: Insufficient documentation

## 2013-09-07 DIAGNOSIS — R51 Headache: Secondary | ICD-10-CM

## 2013-09-07 DIAGNOSIS — Z7901 Long term (current) use of anticoagulants: Secondary | ICD-10-CM

## 2013-09-07 DIAGNOSIS — E119 Type 2 diabetes mellitus without complications: Secondary | ICD-10-CM

## 2013-09-07 DIAGNOSIS — W19XXXA Unspecified fall, initial encounter: Secondary | ICD-10-CM | POA: Insufficient documentation

## 2013-09-07 MED ORDER — OXYCODONE-ACETAMINOPHEN 5-325 MG PO TABS: 1.0000 | ORAL_TABLET | ORAL | Status: DC | PRN

## 2013-09-07 MED ORDER — CYCLOBENZAPRINE HCL 10 MG PO TABS: 10.0000 mg | ORAL_TABLET | Freq: Three times a day (TID) | ORAL | Status: DC | PRN

## 2013-09-07 NOTE — Patient Instructions (Signed)
Cervical Strain and Sprain (Whiplash)  with Rehab  Cervical strain and sprains are injuries that commonly occur with "whiplash" injuries. Whiplash occurs when the neck is forcefully whipped backward or forward, such as during a motor vehicle accident. The muscles, ligaments, tendons, discs and nerves of the neck are susceptible to injury when this occurs.  SYMPTOMS   · Pain or stiffness in the front and/or back of neck  · Symptoms may present immediately or up to 24 hours after injury.  · Dizziness, headache, nausea and vomiting.  · Muscle spasm with soreness and stiffness in the neck.  · Tenderness and swelling at the injury site.  CAUSES   Whiplash injuries often occur during contact sports or motor vehicle accidents.   RISK INCREASES WITH:  · Osteoarthritis of the spine.  · Situations that make head or neck accidents or trauma more likely.  · High-risk sports (football, rugby, wrestling, hockey, auto racing, gymnastics, diving, contact karate or boxing).  · Poor strength and flexibility of the neck.  · Previous neck injury.  · Poor tackling technique.  · Improperly fitted or padded equipment.  PREVENTION  · Learn and use proper technique (avoid tackling with the head, spearing and head-butting; use proper falling techniques to avoid landing on the head).  · Warm up and stretch properly before activity.  · Maintain physical fitness:  · Strength, flexibility and endurance.  · Cardiovascular fitness.  · Wear properly fitted and padded protective equipment, such as padded soft collars, for participation in contact sports.  PROGNOSIS   Recovery for cervical strain and sprain injuries is dependent on the extent of the injury. These injuries are usually curable in 1 week to 3 months with appropriate treatment.   RELATED COMPLICATIONS   · Temporary numbness and weakness may occur if the nerve roots are damaged, and this may persist until the nerve has completely healed.  · Chronic pain due to frequent recurrence of  symptoms.  · Prolonged healing, especially if activity is resumed too soon (before complete recovery).  TREATMENT   Treatment initially involves the use of ice and medication to help reduce pain and inflammation. It is also important to perform strengthening and stretching exercises and modify activities that worsen symptoms so the injury does not get worse. These exercises may be performed at home or with a therapist. For patients who experience severe symptoms, a soft padded collar may be recommended to be worn around the neck.   Improving your posture may help reduce symptoms. Posture improvement includes pulling your chin and abdomen in while sitting or standing. If you are sitting, sit in a firm chair with your buttocks against the back of the chair. While sleeping, try replacing your pillow with a small towel rolled to 2 inches in diameter, or use a cervical pillow or soft cervical collar. Poor sleeping positions delay healing.   For patients with nerve root damage, which causes numbness or weakness, the use of a cervical traction apparatus may be recommended. Surgery is rarely necessary for these injuries. However, cervical strain and sprains that are present at birth (congenital) may require surgery.  MEDICATION   · If pain medication is necessary, nonsteroidal anti-inflammatory medications, such as aspirin and ibuprofen, or other minor pain relievers, such as acetaminophen, are often recommended.  · Do not take pain medication for 7 days before surgery.  · Prescription pain relievers may be given if deemed necessary by your caregiver. Use only as directed and only as much as you   need.  HEAT AND COLD:   · Cold treatment (icing) relieves pain and reduces inflammation. Cold treatment should be applied for 10 to 15 minutes every 2 to 3 hours for inflammation and pain and immediately after any activity that aggravates your symptoms. Use ice packs or an ice massage.  · Heat treatment may be used prior to  performing the stretching and strengthening activities prescribed by your caregiver, physical therapist, or athletic trainer. Use a heat pack or a warm soak.  SEEK MEDICAL CARE IF:   · Symptoms get worse or do not improve in 2 weeks despite treatment.  · New, unexplained symptoms develop (drugs used in treatment may produce side effects).  EXERCISES  RANGE OF MOTION (ROM) AND STRETCHING EXERCISES - Cervical Strain and Sprain  These exercises may help you when beginning to rehabilitate your injury. In order to successfully resolve your symptoms, you must improve your posture. These exercises are designed to help reduce the forward-head and rounded-shoulder posture which contributes to this condition. Your symptoms may resolve with or without further involvement from your physician, physical therapist or athletic trainer. While completing these exercises, remember:   · Restoring tissue flexibility helps normal motion to return to the joints. This allows healthier, less painful movement and activity.  · An effective stretch should be held for at least 20 seconds, although you may need to begin with shorter hold times for comfort.  · A stretch should never be painful. You should only feel a gentle lengthening or release in the stretched tissue.  STRETCH- Axial Extensors  · Lie on your back on the floor. You may bend your knees for comfort. Place a rolled up hand towel or dish towel, about 2 inches in diameter, under the part of your head that makes contact with the floor.  · Gently tuck your chin, as if trying to make a "double chin," until you feel a gentle stretch at the base of your head.  · Hold __________ seconds.  Repeat __________ times. Complete this exercise __________ times per day.   STRETECH - Axial Extension   · Stand or sit on a firm surface. Assume a good posture: chest up, shoulders drawn back, abdominal muscles slightly tense, knees unlocked (if standing) and feet hip width apart.  · Slowly retract your  chin so your head slides back and your chin slightly lowers.Continue to look straight ahead.  · You should feel a gentle stretch in the back of your head. Be certain not to feel an aggressive stretch since this can cause headaches later.  · Hold for __________ seconds.  Repeat __________ times. Complete this exercise __________ times per day.  STRETCH  Cervical Side Bend   · Stand or sit on a firm surface. Assume a good posture: chest up, shoulders drawn back, abdominal muscles slightly tense, knees unlocked (if standing) and feet hip width apart.  · Without letting your nose or shoulders move, slowly tip your right / left ear to your shoulder until your feel a gentle stretch in the muscles on the opposite side of your neck.  · Hold __________ seconds.  Repeat __________ times. Complete this exercise __________ times per day.  STRETCH  Cervical Rotators   · Stand or sit on a firm surface. Assume a good posture: chest up, shoulders drawn back, abdominal muscles slightly tense, knees unlocked (if standing) and feet hip width apart.  · Keeping your eyes level with the ground, slowly turn your head until you feel a gentle   stretch along the back and opposite side of your neck.  · Hold __________ seconds.  Repeat __________ times. Complete this exercise __________ times per day.  RANGE OF MOTION - Neck Circles   · Stand or sit on a firm surface. Assume a good posture: chest up, shoulders drawn back, abdominal muscles slightly tense, knees unlocked (if standing) and feet hip width apart.  · Gently roll your head down and around from the back of one shoulder to the back of the other. The motion should never be forced or painful.  · Repeat the motion 10-20 times, or until you feel the neck muscles relax and loosen.  Repeat __________ times. Complete the exercise __________ times per day.  STRENGTHENING EXERCISES - Cervical Strain and Sprain  These exercises may help you when beginning to rehabilitate your injury. They may  resolve your symptoms with or without further involvement from your physician, physical therapist or athletic trainer. While completing these exercises, remember:   · Muscles can gain both the endurance and the strength needed for everyday activities through controlled exercises.  · Complete these exercises as instructed by your physician, physical therapist or athletic trainer. Progress the resistance and repetitions only as guided.  · You may experience muscle soreness or fatigue, but the pain or discomfort you are trying to eliminate should never worsen during these exercises. If this pain does worsen, stop and make certain you are following the directions exactly. If the pain is still present after adjustments, discontinue the exercise until you can discuss the trouble with your clinician.  STRENGTH Cervical Flexors, Isometric  · Face a wall, standing about 6 inches away. Place a small pillow, a ball about 6-8 inches in diameter, or a folded towel between your forehead and the wall.  · Slightly tuck your chin and gently push your forehead into the soft object. Push only with mild to moderate intensity, building up tension gradually. Keep your jaw and forehead relaxed.  · Hold 10 to 20 seconds. Keep your breathing relaxed.  · Release the tension slowly. Relax your neck muscles completely before you start the next repetition.  Repeat __________ times. Complete this exercise __________ times per day.  STRENGTH- Cervical Lateral Flexors, Isometric   · Stand about 6 inches away from a wall. Place a small pillow, a ball about 6-8 inches in diameter, or a folded towel between the side of your head and the wall.  · Slightly tuck your chin and gently tilt your head into the soft object. Push only with mild to moderate intensity, building up tension gradually. Keep your jaw and forehead relaxed.  · Hold 10 to 20 seconds. Keep your breathing relaxed.  · Release the tension slowly. Relax your neck muscles completely before  you start the next repetition.  Repeat __________ times. Complete this exercise __________ times per day.  STRENGTH  Cervical Extensors, Isometric   · Stand about 6 inches away from a wall. Place a small pillow, a ball about 6-8 inches in diameter, or a folded towel between the back of your head and the wall.  · Slightly tuck your chin and gently tilt your head back into the soft object. Push only with mild to moderate intensity, building up tension gradually. Keep your jaw and forehead relaxed.  · Hold 10 to 20 seconds. Keep your breathing relaxed.  · Release the tension slowly. Relax your neck muscles completely before you start the next repetition.  Repeat __________ times. Complete this exercise __________ times per   day.  POSTURE AND BODY MECHANICS CONSIDERATIONS - Cervical Strain and Sprain  Keeping correct posture when sitting, standing or completing your activities will reduce the stress put on different body tissues, allowing injured tissues a chance to heal and limiting painful experiences. The following are general guidelines for improved posture. Your physician or physical therapist will provide you with any instructions specific to your needs. While reading these guidelines, remember:  · The exercises prescribed by your provider will help you have the flexibility and strength to maintain correct postures.  · The correct posture provides the optimal environment for your joints to work. All of your joints have less wear and tear when properly supported by a spine with good posture. This means you will experience a healthier, less painful body.  · Correct posture must be practiced with all of your activities, especially prolonged sitting and standing. Correct posture is as important when doing repetitive low-stress activities (typing) as it is when doing a single heavy-load activity (lifting).  PROLONGED STANDING WHILE SLIGHTLY LEANING FORWARD  When completing a task that requires you to lean forward while  standing in one place for a long time, place either foot up on a stationary 2-4 inch high object to help maintain the best posture. When both feet are on the ground, the low back tends to lose its slight inward curve. If this curve flattens (or becomes too large), then the back and your other joints will experience too much stress, fatigue more quickly and can cause pain.   RESTING POSITIONS  Consider which positions are most painful for you when choosing a resting position. If you have pain with flexion-based activities (sitting, bending, stooping, squatting), choose a position that allows you to rest in a less flexed posture. You would want to avoid curling into a fetal position on your side. If your pain worsens with extension-based activities (prolonged standing, working overhead), avoid resting in an extended position such as sleeping on your stomach. Most people will find more comfort when they rest with their spine in a more neutral position, neither too rounded nor too arched. Lying on a non-sagging bed on your side with a pillow between your knees, or on your back with a pillow under your knees will often provide some relief. Keep in mind, being in any one position for a prolonged period of time, no matter how correct your posture, can still lead to stiffness.  WALKING  Walk with an upright posture. Your ears, shoulders and hips should all line-up.  OFFICE WORK  When working at a desk, create an environment that supports good, upright posture. Without extra support, muscles fatigue and lead to excessive strain on joints and other tissues.  CHAIR:  · A chair should be able to slide under your desk when your back makes contact with the back of the chair. This allows you to work closely.  · The chair's height should allow your eyes to be level with the upper part of your monitor and your hands to be slightly lower than your elbows.  · Body position:  · Your feet should make contact with the floor. If this is  not possible, use a foot rest.  · Keep your ears over your shoulders. This will reduce stress on your neck and low back.  Document Released: 08/14/2005 Document Revised: 12/09/2012 Document Reviewed: 11/26/2008  [employer]® Patient Information ©2014 [employer].

## 2013-09-07 NOTE — Telephone Encounter (Signed)
Clld Pt - Per Dr. Brigitte Pulse, advsd Ct Head/Neck is negative and she can take the Flexeril and Percocet as prescribed.  Pt stated she understood.

## 2013-09-07 NOTE — Progress Notes (Signed)
Patient referred for stat CT head for headaches  Authorization from insurance company  CC: [phone]

## 2013-09-07 NOTE — Progress Notes (Signed)
Subjective:    Patient ID: Cynthia Cameron, female    DOB: 1961-03-16, 53 y.o.   MRN: [id number] Chief Complaint  Patient presents with  . Fall    hit back of head  2 days ago head hurts   HPI   2d ago she slipped on a sheet of ice and fell backwards and hit head on the concrete. She had nothing to hold onto.  She is on xarelto for a PE in March.  The back of her head is really sore.  Is very sore, nauseated last night - took 1 pain pill (percocet) left which she took last night to help her get to sleep.  No vomiting. Bowels nml. No more fatigue.  No drowsiness. No weakness.  Sleeping a normal amount. Feels like anterior neck is sore and swollen.   Denies any vision changes.  HA is located in occiput and radiates down her neck.  No lightheadedness or dizziness. No pre-syncopal.  No memory problems, no personality changes, not sluggish, no problems w/ arms or legs - no weakness, numbness, pain, or uncoordination.  Past Medical History  Diagnosis Date  . Allergy   . Hypertension   . Shortness of breath   . High cholesterol   . Pulmonary embolism on right 09/2012  . DVT, lower extremity 10/2012    "2" (12/24/2012)  . Chest pain     "since 09/2012; hurts all the time" (12/24/2012)  . Type II diabetes mellitus   . Iron deficiency anemia   . Pneumonia 09/2012  . Depression   . Bipolar depression    Current Outpatient Prescriptions on File Prior to Visit  Medication Sig Dispense Refill  . amLODipine (NORVASC) 5 MG tablet TAKE 1 TABLET BY MOUTH EVERY DAY  30 tablet  4  . buPROPion (WELLBUTRIN) 100 MG tablet Take 50 mg by mouth daily.       . cloNIDine (CATAPRES) 0.2 MG tablet Take 0.2 mg by mouth at bedtime.      . furosemide (LASIX) 40 MG tablet Take 1 tablet (40 mg total) by mouth daily.  30 tablet  1  . glucose blood (FREESTYLE LITE) test strip To check blood sugars tid dx code 250.00  100 each  3  . insulin aspart (NOVOLOG) 100 UNIT/ML injection Inject 0-15 Units into the skin 3  (three) times daily with meals.  1 vial  1  . insulin glargine (LANTUS) 100 UNIT/ML injection Inject 0.2 mLs (20 Units total) into the skin at bedtime.  10 mL  1  . Insulin Syringes, Disposable, U-100 0.5 ML MISC 3-20 Units by Does not apply route 4 (four) times daily -  before meals and at bedtime.  120 each  1  . KLOR-CON 10 10 MEQ tablet Take 2 tablets by mouth 2 (two) times daily.      . Liraglutide (VICTOZA [place]) Inject 0.6 Units into the skin daily.      Marland Kitchen lisinopril (PRINIVIL,ZESTRIL) 20 MG tablet Take 1 tablet (20 mg total) by mouth daily. PATIENT NEEDS OFFICE VISIT FOR ADDITIONAL REFILLS  30 tablet  0  . Multiple Vitamin (MULTIVITAMIN WITH MINERALS) TABS Take 1 tablet by mouth daily.      . nebivolol (BYSTOLIC) 10 MG tablet Take 10 mg by mouth daily.      Marland Kitchen OLANZapine (ZYPREXA) 5 MG tablet Take 5 mg by mouth at bedtime.      . pravastatin (PRAVACHOL) 40 MG tablet TAKE 1 TABLET (40 MG TOTAL) BY MOUTH  DAILY.  90 tablet  1  . predniSONE (DELTASONE) 2.5 MG tablet Use as directed  60 tablet  1  . XARELTO 20 MG TABS TAKE 1 TABLET (20 MG TOTAL) BY MOUTH DAILY**BEGIN AFTER FINISHING 15 MG**  30 tablet  0  . oxyCODONE-acetaminophen (PERCOCET/ROXICET) 5-325 MG per tablet Take 1 tablet by mouth as needed.       No current facility-administered medications on file prior to visit.   No Known Allergies   Review of Systems  Constitutional: Positive for activity change and appetite change. Negative for fever, chills, diaphoresis and fatigue.  HENT: Negative for ear discharge, ear pain, facial swelling and sinus pressure.   Eyes: Negative for photophobia and visual disturbance.  Respiratory: Negative for cough and shortness of breath.   Cardiovascular: Negative for chest pain, palpitations and leg swelling.  Gastrointestinal: Positive for nausea. Negative for vomiting, abdominal pain, diarrhea and constipation.  Genitourinary: Negative for decreased urine volume.  Musculoskeletal: Positive for  arthralgias, back pain, myalgias, neck pain and neck stiffness. Negative for gait problem and joint swelling.  Skin: Negative for color change, pallor, rash and wound.  Neurological: Positive for headaches. Negative for dizziness, tremors, seizures, syncope, facial asymmetry, speech difficulty, weakness, light-headedness and numbness.  Hematological: Bruises/bleeds easily.  Psychiatric/Behavioral: Negative for sleep disturbance.       BP 140/80  Pulse 67  Temp(Src) 98.1 F (36.7 C) (Oral)  Resp 16  Ht 5\' 3"  (1.6 m)  Wt 194 lb (87.998 kg)  BMI 34.37 kg/m2  SpO2 98%  LMP 09/16/2010 Objective:   Physical Exam  Constitutional: She appears well-developed and well-nourished. No distress.  HENT:  Head: Normocephalic and atraumatic.  Right Ear: Tympanic membrane, external ear and ear canal normal.  Left Ear: Tympanic membrane, external ear and ear canal normal.  Nose: Nose normal. Right sinus exhibits no maxillary sinus tenderness. Left sinus exhibits no maxillary sinus tenderness.  Mouth/Throat: Uvula is midline, oropharynx is clear and moist and mucous membranes are normal. No oropharyngeal exudate.  Eyes: Conjunctivae and EOM are normal. Pupils are equal, round, and reactive to light.  Neck: Neck supple. Muscular tenderness present. No spinous process tenderness present. No rigidity. Decreased range of motion present. No edema and no erythema present. No mass and no thyromegaly present.  Cardiovascular: Normal rate, regular rhythm, normal heart sounds and intact distal pulses.   Pulmonary/Chest: Effort normal and breath sounds normal. No respiratory distress.  Musculoskeletal:       Cervical back: She exhibits decreased range of motion, tenderness and spasm. She exhibits no bony tenderness and no deformity.  Neurological: She is alert. She has normal strength. She is not disoriented. She displays no tremor. No cranial nerve deficit or sensory deficit. She displays a negative Romberg sign.  Coordination and gait abnormal.  heal to toe mildly ataxic Finger to nose good Rapid alternating mvmnts very poor  Skin: Skin is warm and dry. She is not diaphoretic.  Psychiatric: She has a normal mood and affect. Her behavior is normal.     [facility] reading (PRIMARY) by  Dr. Brigitte Pulse. C-spine: bony fragment between c4-5 and larger between c6-7. No prior imaging seen for comparison and pt w/ acute injury so will send for stat over-read.  EXAM: CT HEAD WITHOUT CONTRAST  TECHNIQUE: Contiguous axial images were obtained from the base of the skull through the vertex without contrast.  COMPARISON: None  FINDINGS: Normal appearance of the intracranial structures. No evidence for acute hemorrhage, mass lesion, midline shift, hydrocephalus or  large infarct. No acute bony abnormality. The visualized sinuses are clear.  IMPRESSION: No acute intracranial abnormality.  EXAM: CERVICAL SPINE - 2-3 VIEW  COMPARISON: None.  FINDINGS: Normal alignment is noted.  There is no evidence of fracture, subluxation or prevertebral soft tissue swelling.  Mild multilevel degenerative disc disease and facet arthropathy noted.  No focal bony lesions are present.  IMPRESSION: No static evidence of acute injury to the cervical spine.  Mild multilevel degenerative changes. Assessment & Plan:   Pulmonary embolism  Hypertension  Diabetes mellitus without complication  Long term (current) use of anticoagulants  Headache(784.0) - Plan: DG Cervical Spine 2 or 3 views, CT Head Wo Contrast, CANCELED: CT Head Wo Contrast - luckily imaging negative - treat symptomatically w/ refill or percocet and try muscle relaxant. RTC if sxs cont.  Meds ordered this encounter  Medications  . KLOR-CON M10 10 MEQ tablet    Sig:   . oxyCODONE-acetaminophen (PERCOCET/ROXICET) 5-325 MG per tablet    Sig: Take 1 tablet by mouth as needed.    Dispense:  30 tablet    Refill:  0  . cyclobenzaprine (FLEXERIL) 10 MG  tablet    Sig: Take 1 tablet (10 mg total) by mouth 3 (three) times daily as needed for muscle spasms.    Dispense:  30 tablet    Refill:  0    Delman Cheadle, MD MPH

## 2013-09-09 ENCOUNTER — Ambulatory Visit: Payer: 59 | Admitting: Pulmonary Disease

## 2013-09-24 ENCOUNTER — Ambulatory Visit (INDEPENDENT_AMBULATORY_CARE_PROVIDER_SITE_OTHER): Payer: 59 | Admitting: Family Medicine

## 2013-09-24 ENCOUNTER — Ambulatory Visit: Payer: 59

## 2013-09-24 VITALS — BP 110/80 | HR 72 | Temp 98.0°F | Resp 16 | Ht 62.5 in | Wt 188.0 lb

## 2013-09-24 DIAGNOSIS — R079 Chest pain, unspecified: Secondary | ICD-10-CM

## 2013-09-24 DIAGNOSIS — K5900 Constipation, unspecified: Secondary | ICD-10-CM

## 2013-09-24 DIAGNOSIS — R609 Edema, unspecified: Secondary | ICD-10-CM

## 2013-09-24 DIAGNOSIS — Z1211 Encounter for screening for malignant neoplasm of colon: Secondary | ICD-10-CM

## 2013-09-24 DIAGNOSIS — Z0000 Encounter for general adult medical examination without abnormal findings: Secondary | ICD-10-CM

## 2013-09-24 DIAGNOSIS — R600 Localized edema: Secondary | ICD-10-CM

## 2013-09-24 LAB — COMPREHENSIVE METABOLIC PANEL
ALT: 16 U/L (ref 0–35)
Alkaline Phosphatase: 103 U/L (ref 39–117)
BUN: 15 mg/dL (ref 6–23)
CO2: 30 mEq/L (ref 19–32)
Calcium: 10.2 mg/dL (ref 8.4–10.5)
Chloride: 102 mEq/L (ref 96–112)
Creat: 1.48 mg/dL — ABNORMAL HIGH (ref 0.50–1.10)
Glucose, Bld: 136 mg/dL — ABNORMAL HIGH (ref 70–99)
Sodium: 142 mEq/L (ref 135–145)
Total Bilirubin: 0.4 mg/dL (ref 0.2–1.2)
Total Protein: 7.7 g/dL (ref 6.0–8.3)

## 2013-09-24 LAB — POCT CBC
Granulocyte percent: 68.1 % (ref 37–80)
HCT, POC: 44.1 % (ref 37.7–47.9)
Hemoglobin: 13.9 g/dL (ref 12.2–16.2)
Lymph, poc: 2.3 (ref 0.6–3.4)
MCH, POC: 26.9 pg — AB (ref 27–31.2)
MCHC: 31.5 g/dL — AB (ref 31.8–35.4)
MCV: 85.4 fL (ref 80–97)
MID (cbc): 0.5 (ref 0–0.9)
MPV: 7.6 fL (ref 0–99.8)
POC Granulocyte: 5.9 (ref 2–6.9)
POC LYMPH PERCENT: 26.2 %L (ref 10–50)
POC MID %: 5.7 %M (ref 0–12)
Platelet Count, POC: 373 10*3/uL (ref 142–424)
RBC: 5.16 M/uL (ref 4.04–5.48)
RDW, POC: 16.2 %
WBC: 8.7 10*3/uL (ref 4.6–10.2)

## 2013-09-24 LAB — POCT URINALYSIS DIPSTICK
Bilirubin, UA: NEGATIVE
Blood, UA: NEGATIVE
Glucose, UA: NEGATIVE
Ketones, UA: NEGATIVE
Leukocytes, UA: NEGATIVE
Nitrite, UA: NEGATIVE
Protein, UA: 30
Spec Grav, UA: 1.015
Urobilinogen, UA: 0.2
pH, UA: 7

## 2013-09-24 LAB — POCT UA - MICROSCOPIC ONLY
Casts, Ur, LPF, POC: NEGATIVE
Crystals, Ur, HPF, POC: NEGATIVE
Mucus, UA: NEGATIVE
Yeast, UA: NEGATIVE

## 2013-09-24 LAB — LIPID PANEL
Cholesterol: 255 mg/dL — ABNORMAL HIGH (ref 0–200)
HDL: 50 mg/dL (ref >39)
LDL Cholesterol: 181 mg/dL — ABNORMAL HIGH (ref 0–99)
Total CHOL/HDL Ratio: 5.1 ratio
Triglycerides: 119 mg/dL (ref <150)
VLDL: 24 mg/dL (ref 0–40)

## 2013-09-24 LAB — COMPREHENSIVE METABOLIC PANEL WITH GFR
AST: 17 U/L (ref 0–37)
Albumin: 4.3 g/dL (ref 3.5–5.2)
Potassium: 3.1 meq/L — ABNORMAL LOW (ref 3.5–5.3)

## 2013-09-24 LAB — IFOBT (OCCULT BLOOD): IFOBT: NEGATIVE

## 2013-09-24 NOTE — Progress Notes (Signed)
Chief Complaint:  Chief Complaint  Patient presents with  . Annual Exam    HPI: Cynthia Cameron is a 53 y.o. female who is here for: CPE and also has had chronic chest pain and wants to make sure she does not have fluid in her lungs She has a history PE and Left leg DVT on Xarelto, denies abnormal bleeding Dr Garwin Brothers  ( ob/gyn) had a full pap yearly , She would still like a pap since she had ASCUS Chest pressure x daily and night for the several weeks, no SOB, palpitations, nvabdpain, jaw pain, shoulder pain, she is worried about fluid in her lungs since she had a pleural effusion last year from when she had PNA She  Has been weighing herself 185 lbs and today she is 188lbs, she is on Lasix daily and increased dose prn  Her DM is followed by Dr Ralene Muskrat was done in 1-2 months was <7 She is on prednisone for CP /pleurisy by Dr Sheppard Penton for PE and DVT ( next appt Feb 10), she is on  0.5 mg QOD for pluerisy Colonoscopy not done since age 30 y/o UTD on flu vaccine   TESTS:  CT chest 11/04/12 >> moderate pericardial fluid, borderline LAN, mod Lt effusion with passive ATX LLL  Lt thoracentesis 11/05/12 >> small volume removed, not enough for analysis  Doppler legs 11/06/12 >> DVT Lt posterior tibial and peroneal vein  CT chest 11/08/12 >> small/mod pericardial effusion, Lt > Rt pleural effusions, Rt pulmonary artery PE, ATX LLL  Echo 11/19/12 >> EF 50 to 55%  Lt thoracentesis 11/20/12 >>900 cc turbid fluid, protein 5.1, LDH 149, WBC 2948 (56N, 37L, 72M); Cytology >> mesothelial cells with calretinin, WT-1, cytokeratin 5/6 positive  CT chest 11/25/12 >> small Lt > Rt pleural effusions, basilar ATX/consolidation, decreased pericardial effusion  CT chest 05/15/13 >> ATX RML, no PE  Doppler legs b/l 05/16/13 >> no DVT  Labs 05/20/13 >> ESR 90, ANA negative, RF 12   Past Medical History  Diagnosis Date  . Allergy   . Hypertension   . Shortness of breath   . High cholesterol   .  Pulmonary embolism on right 09/2012  . DVT, lower extremity 10/2012    "2" (12/24/2012)  . Chest pain     "since 09/2012; hurts all the time" (12/24/2012)  . Type II diabetes mellitus   . Iron deficiency anemia   . Pneumonia 09/2012  . Depression   . Bipolar depression    Past Surgical History  Procedure Laterality Date  . Cesarean section  1994; 1996  . Cardiac catheterization  09/2012   History   Social History  . Marital Status: Married    Spouse Name: N/A    Number of Children: N/A  . Years of Education: N/A   Social History Main Topics  . Smoking status: Never Smoker   . Smokeless tobacco: Never Used  . Alcohol Use: Yes     Comment: 12/24/2012 "mixed drink maybe q 6 months or more"  . Drug Use: No  . Sexual Activity: Yes    Birth Control/ Protection: Condom   Other Topics Concern  . None   Social History Narrative   LIves in [place]. 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    Family History  Problem Relation Age of Onset  . Hypertension Father   . Diabetes Mother   . Atrial fibrillation Mother    No Known  Allergies Prior to Admission medications   Medication Sig Start Date End Date Taking? Authorizing Provider  amLODipine (NORVASC) 5 MG tablet TAKE 1 TABLET BY MOUTH EVERY DAY 01/02/13  Yes Gay Filler Copland, MD  buPROPion (WELLBUTRIN) 100 MG tablet Take 50 mg by mouth daily.    Yes Historical Provider, MD  cloNIDine (CATAPRES) 0.2 MG tablet Take 0.2 mg by mouth at bedtime.   Yes Historical Provider, MD  furosemide (LASIX) 40 MG tablet Take 1 tablet (40 mg total) by mouth daily. 11/28/12  Yes Birdie Riddle, MD  glucose blood (FREESTYLE LITE) test strip To check blood sugars tid dx code 250.00 12/30/12  Yes Gay Filler Copland, MD  insulin aspart (NOVOLOG) 100 UNIT/ML injection Inject 0-15 Units into the skin 3 (three) times daily with meals. 12/25/12  Yes Birdie Riddle, MD  insulin glargine (LANTUS) 100 UNIT/ML injection Inject 0.2 mLs (20 Units total)  into the skin at bedtime. 12/25/12  Yes Birdie Riddle, MD  Insulin Syringes, Disposable, U-100 0.5 ML MISC 3-20 Units by Does not apply route 4 (four) times daily -  before meals and at bedtime. 12/25/12  Yes Birdie Riddle, MD  KLOR-CON 10 10 MEQ tablet Take 2 tablets by mouth 2 (two) times daily. 05/25/13  Yes Historical Provider, MD  KLOR-CON M10 10 MEQ tablet  06/24/13  Yes Historical Provider, MD  Liraglutide (VICTOZA [place]) Inject 0.6 Units into the skin daily.   Yes Historical Provider, MD  lisinopril (PRINIVIL,ZESTRIL) 20 MG tablet Take 1 tablet (20 mg total) by mouth daily. PATIENT NEEDS OFFICE VISIT FOR ADDITIONAL REFILLS 06/12/13  Yes Darreld Mclean, MD  Multiple Vitamin (MULTIVITAMIN WITH MINERALS) TABS Take 1 tablet by mouth daily.   Yes Historical Provider, MD  nebivolol (BYSTOLIC) 10 MG tablet Take 10 mg by mouth daily.   Yes Historical Provider, MD  OLANZapine (ZYPREXA) 5 MG tablet Take 5 mg by mouth at bedtime.   Yes Historical Provider, MD  pravastatin (PRAVACHOL) 40 MG tablet TAKE 1 TABLET (40 MG TOTAL) BY MOUTH DAILY. 12/25/12  Yes Heather M Marte, PA-C  predniSONE (DELTASONE) 2.5 MG tablet Use as directed 07/14/13  Yes Vineet Sood, MD  XARELTO 20 MG TABS TAKE 1 TABLET (20 MG TOTAL) BY MOUTH DAILY**BEGIN AFTER FINISHING 15 MG** 01/29/13  Yes Gay Filler Copland, MD  cyclobenzaprine (FLEXERIL) 10 MG tablet Take 1 tablet (10 mg total) by mouth 3 (three) times daily as needed for muscle spasms. 09/07/13   Shawnee Knapp, MD  oxyCODONE-acetaminophen (PERCOCET/ROXICET) 5-325 MG per tablet Take 1 tablet by mouth as needed. 09/07/13   Shawnee Knapp, MD     ROS: The patient denies fevers, chills, night sweats, unintentional weight loss, palpitations, wheezing, dyspnea on exertion, nausea, vomiting, abdominal pain, dysuria, hematuria, melena, numbness, weakness, or tingling.   All other systems have been reviewed and were otherwise negative with the exception of those mentioned in the HPI and as above.     PHYSICAL EXAM: Filed Vitals:   09/24/13 1444  BP: 110/80  Pulse: 72  Temp: 98 F (36.7 C)  Resp: 16   Filed Vitals:   09/24/13 1444  Height: 5' 2.5" (1.588 m)  Weight: 188 lb (85.276 kg)   Body mass index is 33.82 kg/(m^2).  General: Alert, no acute distress HEENT:  Normocephalic, atraumatic, oropharynx patent. EOMI, PERRLA, fundoscopic exam nl Cardiovascular:  Regular rate and rhythm, no rubs murmurs or gallops.  No Carotid bruits, radial pulse intact. +pedal edema.  Respiratory: Clear to auscultation bilaterally.  No wheezes, rales, or rhonchi.  No cyanosis, no use of accessory musculature GI: No organomegaly, abdomen is soft and non-tender, positive bowel sounds.  No masses. Skin: No rashes. Neurologic: Facial musculature symmetric. Psychiatric: Patient is appropriate throughout our interaction. Lymphatic: No cervical lymphadenopathy Musculoskeletal: Gait intact. Breast and GU exam nl Rectal exam nl except for + ext hermorrhoids   LABS: Results for orders placed in visit on 09/24/13  IFOBT (OCCULT BLOOD)      Result Value Range   IFOBT Negative    POCT CBC      Result Value Range   WBC 8.7  4.6 - 10.2 K/uL   Lymph, poc 2.3  0.6 - 3.4   POC LYMPH PERCENT 26.2  10 - 50 %L   MID (cbc) 0.5  0 - 0.9   POC MID % 5.7  0 - 12 %M   POC Granulocyte 5.9  2 - 6.9   Granulocyte percent 68.1  37 - 80 %G   RBC 5.16  4.04 - 5.48 M/uL   Hemoglobin 13.9  12.2 - 16.2 g/dL   HCT, POC 44.1  37.7 - 47.9 %   MCV 85.4  80 - 97 fL   MCH, POC 26.9 (*) 27 - 31.2 pg   MCHC 31.5 (*) 31.8 - 35.4 g/dL   RDW, POC 16.2     Platelet Count, POC 373  142 - 424 K/uL   MPV 7.6  0 - 99.8 fL  POCT UA - MICROSCOPIC ONLY      Result Value Range   WBC, Ur, HPF, POC 0-1     RBC, urine, microscopic 0-2     Bacteria, U Microscopic trace     Mucus, UA neg     Epithelial cells, urine per micros 0-1     Crystals, Ur, HPF, POC neg     Casts, Ur, LPF, POC neg     Yeast, UA neg    POCT URINALYSIS  DIPSTICK      Result Value Range   Color, UA yellow     Clarity, UA clear     Glucose, UA neg     Bilirubin, UA neg     Ketones, UA neg     Spec Grav, UA 1.015     Blood, UA neg     pH, UA 7.0     Protein, UA 30     Urobilinogen, UA 0.2     Nitrite, UA neg     Leukocytes, UA Negative       EKG/XRAY:   Primary read interpreted by Dr. Marin Comment at [facility]. No acute cardiopulmonary process   ASSESSMENT/PLAN: Encounter Diagnoses  Name Primary?  . Annual physical exam Yes  . Encounter for screening colonoscopy   . Chest pain   . Unspecified constipation   . Pedal edema    Annual with pap, refer to GI for screening colonsocpy She is UTD on flu vaccine ,eye exam She has a mmaogram pending CXR did not show fluid which she was worried about Annual labs pending Advise to call her nephrologist who is taking care of her kidneys to see if norvasc  Can be dc since I am not in charge of her BP meds and that she can increase her other medications.  F/u prn  Gross sideeffects, risk and benefits, and alternatives of medications d/w patient. Patient is aware that all medications have potential sideeffects and we are unable to predict every sideeffect or  drug-drug interaction that may occur.  Kaliya Shreiner, [place], DO 09/24/2013 6:06 PM

## 2013-09-24 NOTE — Patient Instructions (Signed)

## 2013-09-25 ENCOUNTER — Telehealth: Payer: Self-pay | Admitting: Family Medicine

## 2013-09-25 LAB — PAP IG W/ RFLX HPV ASCU

## 2013-09-25 LAB — TSH: TSH: 0.495 u[IU]/mL (ref 0.350–4.500)

## 2013-09-25 NOTE — Telephone Encounter (Signed)
LM about labs, Potassium is low, needs to either take potassium pills if not or increase dose. Advise to call me back.

## 2013-10-01 ENCOUNTER — Other Ambulatory Visit: Payer: Self-pay

## 2013-10-01 DIAGNOSIS — Z1231 Encounter for screening mammogram for malignant neoplasm of breast: Secondary | ICD-10-CM

## 2013-10-07 ENCOUNTER — Ambulatory Visit: Payer: 59 | Admitting: Pulmonary Disease

## 2013-10-17 ENCOUNTER — Other Ambulatory Visit: Payer: Self-pay | Admitting: Pulmonary Disease

## 2013-10-17 NOTE — Telephone Encounter (Signed)
lmtcb x1 Need to know why refill is needed.

## 2013-10-18 ENCOUNTER — Other Ambulatory Visit (HOSPITAL_COMMUNITY): Payer: Self-pay | Admitting: Family Medicine

## 2013-10-20 ENCOUNTER — Telehealth: Payer: Self-pay | Admitting: Pulmonary Disease

## 2013-10-20 NOTE — Telephone Encounter (Signed)
Spoke with pt. She needed ot r/s appt but will call back once she knows her schedule. appt for wed has ben cancelled. Nothing further needed

## 2013-10-22 ENCOUNTER — Ambulatory Visit: Payer: 59 | Admitting: Pulmonary Disease

## 2013-11-11 ENCOUNTER — Ambulatory Visit: Payer: 59

## 2013-11-18 ENCOUNTER — Ambulatory Visit: Payer: 59

## 2013-11-23 ENCOUNTER — Ambulatory Visit (INDEPENDENT_AMBULATORY_CARE_PROVIDER_SITE_OTHER): Payer: 59 | Admitting: Internal Medicine

## 2013-11-23 ENCOUNTER — Ambulatory Visit: Payer: 59

## 2013-11-23 VITALS — BP 104/72 | HR 93 | Temp 97.9°F | Resp 18 | Ht 63.0 in | Wt 187.0 lb

## 2013-11-23 DIAGNOSIS — R21 Rash and other nonspecific skin eruption: Secondary | ICD-10-CM

## 2013-11-23 DIAGNOSIS — M25571 Pain in right ankle and joints of right foot: Secondary | ICD-10-CM

## 2013-11-23 DIAGNOSIS — M25579 Pain in unspecified ankle and joints of unspecified foot: Secondary | ICD-10-CM

## 2013-11-23 LAB — POCT SKIN KOH: Skin KOH, POC: NEGATIVE

## 2013-11-23 MED ORDER — CLOTRIMAZOLE-BETAMETHASONE 1-0.05 % EX CREA: 1.0000 "application " | TOPICAL_CREAM | Freq: Two times a day (BID) | CUTANEOUS | Status: DC

## 2013-11-23 MED ORDER — MELOXICAM 15 MG PO TABS: 15.0000 mg | ORAL_TABLET | Freq: Every day | ORAL | Status: DC

## 2013-11-23 NOTE — Progress Notes (Addendum)
Subjective:  This chart was scribed for Tami Lin, MD by Roxan Diesel, Scribe.  This patient was seen in [place] [facility] and the patient's care was started at 4:52 PM.   Patient ID: Cynthia Cameron, female    DOB: 1961-01-28, 53 y.o.   MRN: [id number]   HPI  HPI Comments: Cynthia Cameron is a 53 y.o. female with h/o DM, HTN, and high cholesterol who presents to [facility] complaining of 4 days of posterior right ankle pain.  Pt denies injury to the ankle.  She notes that she was recently wearing "cheap flats" without any support at a funeral of yet the day before this pain started, but she has had no pain to the other ankle.  She denies prior surgery to the ankle.  She wears padded tennis shoes at work.  Pt also complains of "flat red spots" on her chest that she first noticed about 4 days ago.  She states they are only minimally itchy if at all.  They have not been bubbling.  She placed antibiotic cream on the areas, without relief. She does not have cats or dogs at home.   Patient Active Problem List   Diagnosis Date Noted   Pleuritic chest pain 05/22/2013   Pulmonary embolism 11/07/2012   DVT (deep venous thrombosis) 11/07/2012   Pericardial effusion 11/06/2012   Pleural effusion 11/05/2012   Diabetes mellitus without complication    Hypertension    Depression     Past Medical History  Diagnosis Date   Allergy    Hypertension    Shortness of breath    High cholesterol    Pulmonary embolism on right 09/2012   DVT, lower extremity 10/2012    "2" (12/24/2012)   Chest pain     "since 09/2012; hurts all the time" (12/24/2012)   Type II diabetes mellitus    Iron deficiency anemia    Pneumonia 09/2012   Depression    Bipolar depression     Past Surgical History  Procedure Laterality Date   Cesarean section  1994; 1996   Cardiac catheterization  09/2012    Prior to Admission medications   Medication Sig Start Date End Date Taking?  Authorizing Provider  amLODipine (NORVASC) 5 MG tablet TAKE 1 TABLET BY MOUTH EVERY DAY 01/02/13  Yes Gay Filler Copland, MD  buPROPion (WELLBUTRIN) 100 MG tablet Take 50 mg by mouth daily.    Yes Historical Provider, MD  cloNIDine (CATAPRES) 0.2 MG tablet Take 0.2 mg by mouth at bedtime.   Yes Historical Provider, MD  cyclobenzaprine (FLEXERIL) 10 MG tablet Take 1 tablet (10 mg total) by mouth 3 (three) times daily as needed for muscle spasms. 09/07/13  Yes Shawnee Knapp, MD  furosemide (LASIX) 40 MG tablet Take 1 tablet (40 mg total) by mouth daily. 11/28/12  Yes Birdie Riddle, MD  glucose blood (FREESTYLE LITE) test strip To check blood sugars tid dx code 250.00 12/30/12  Yes Gay Filler Copland, MD  insulin aspart (NOVOLOG) 100 UNIT/ML injection Inject 0-15 Units into the skin 3 (three) times daily with meals. 12/25/12  Yes Birdie Riddle, MD  insulin glargine (LANTUS) 100 UNIT/ML injection Inject 0.2 mLs (20 Units total) into the skin at bedtime. 12/25/12  Yes Birdie Riddle, MD  Insulin Syringes, Disposable, U-100 0.5 ML MISC 3-20 Units by Does not apply route 4 (four) times daily -  before meals and at bedtime. 12/25/12  Yes Birdie Riddle, MD  KLOR-CON 10  10 MEQ tablet Take 2 tablets by mouth 2 (two) times daily. 05/25/13  Yes Historical Provider, MD  KLOR-CON M10 10 MEQ tablet  06/24/13  Yes Historical Provider, MD  Liraglutide (VICTOZA [place]) Inject 0.6 Units into the skin daily.   Yes Historical Provider, MD  lisinopril (PRINIVIL,ZESTRIL) 20 MG tablet Take 1 tablet (20 mg total) by mouth daily. PATIENT NEEDS OFFICE VISIT FOR ADDITIONAL REFILLS 06/12/13  Yes Darreld Mclean, MD  Multiple Vitamin (MULTIVITAMIN WITH MINERALS) TABS Take 1 tablet by mouth daily.   Yes Historical Provider, MD  nebivolol (BYSTOLIC) 10 MG tablet Take 10 mg by mouth daily.   Yes Historical Provider, MD  OLANZapine (ZYPREXA) 5 MG tablet Take 5 mg by mouth at bedtime.   Yes Historical Provider, MD  oxyCODONE-acetaminophen  (PERCOCET/ROXICET) 5-325 MG per tablet Take 1 tablet by mouth as needed. 09/07/13  Yes Shawnee Knapp, MD  pravastatin (PRAVACHOL) 40 MG tablet TAKE 1 TABLET (40 MG TOTAL) BY MOUTH DAILY. 12/25/12  Yes Heather Elnora Morrison, PA-C  predniSONE (DELTASONE) 2.5 MG tablet Use as directed 07/14/13  Yes Chesley Mires, MD  Rivaroxaban (XARELTO) 20 MG TABS tablet Take 1 tablet (20 mg total) by mouth daily.   Yes Thao P Le, DO      Review of Systems  Musculoskeletal: Positive for arthralgias (right ankle).  Skin: Positive for rash.   no calf swelling or weakness No fever     Objective:   Physical Exam  Nursing note and vitals reviewed. Constitutional: She is oriented to person, place, and time. She appears well-developed and well-nourished. No distress.  HENT:  Head: Normocephalic and atraumatic.  Eyes: EOM are normal.  Neck: Neck supple.  Cardiovascular: Normal rate.   Pulmonary/Chest: Effort normal. No respiratory distress.  Musculoskeletal: Normal range of motion.  Mildly swollen right ankle, without pitting edema or erythema.  Nontender along the joint lines.  Mostly tender over the calcaneus and the Achilles near its attachment, but there is no redness and minimal swelling.  Significant pain with dorsiflexion of the foot, felt in the Achilles attachment.  Neurological: She is alert and oriented to person, place, and time.  Skin: Skin is warm and dry.  5 circular lesions on the anterior chest that have a slightly raised border and are somehwat flaky  Psychiatric: She has a normal mood and affect. Her behavior is normal.     BP 104/72   Pulse 93   Temp(Src) 97.9 F (36.6 C)   Resp 18   Ht 5\' 3"  (1.6 m)   Wt 187 lb (84.823 kg)   BMI 33.13 kg/m2   SpO2 97%   LMP 09/16/2010  [facility] reading (PRIMARY) by  Dr. Laney Pastor there is an Achilles spur but otherwise the ankle is normal  KOH from skin lesions is equivocal     Assessment & Plan:  Pain ankle secondary to Achilles  tendinopathy  Ice  Meloxicam  Heel pad /tennies at work  Calf stretches  Tinea corporis likely unless nonspecific drug reaction  Lotrisone for 2 weeks and recheck   Meds ordered this encounter  Medications   meloxicam (MOBIC) 15 MG tablet    Sig: Take 1 tablet (15 mg total) by mouth daily.    Dispense:  30 tablet    Refill:  0   clotrimazole-betamethasone (LOTRISONE) cream    Sig: Apply 1 application topically 2 (two) times daily.    Dispense:  30 g    Refill:  0  I have completed the patient encounter in its entirety as documented by the scribe, with editing by me where necessary. Robert P. Laney Pastor, M.D.

## 2013-11-23 NOTE — Addendum Note (Signed)
Addended by: Sunday Spillers on: 11/23/2013 05:47 PM   Modules accepted: Orders

## 2013-12-01 ENCOUNTER — Ambulatory Visit: Payer: 59

## 2013-12-04 ENCOUNTER — Ambulatory Visit
Admission: RE | Admit: 2013-12-04 | Discharge: 2013-12-04 | Disposition: A | Discharge status: Home / Self Care | Payer: 59 | Source: Ambulatory Visit

## 2013-12-04 DIAGNOSIS — Z1231 Encounter for screening mammogram for malignant neoplasm of breast: Secondary | ICD-10-CM

## 2013-12-09 ENCOUNTER — Other Ambulatory Visit: Payer: Self-pay | Admitting: Obstetrics and Gynecology

## 2013-12-09 DIAGNOSIS — R928 Other abnormal and inconclusive findings on diagnostic imaging of breast: Secondary | ICD-10-CM

## 2013-12-18 ENCOUNTER — Ambulatory Visit
Admission: RE | Admit: 2013-12-18 | Discharge: 2013-12-18 | Disposition: A | Discharge status: Home / Self Care | Payer: 59 | Source: Ambulatory Visit | Attending: Obstetrics and Gynecology | Admitting: Obstetrics and Gynecology

## 2013-12-18 DIAGNOSIS — R928 Other abnormal and inconclusive findings on diagnostic imaging of breast: Secondary | ICD-10-CM

## 2013-12-29 ENCOUNTER — Ambulatory Visit (INDEPENDENT_AMBULATORY_CARE_PROVIDER_SITE_OTHER): Payer: 59 | Admitting: Pulmonary Disease

## 2013-12-29 ENCOUNTER — Encounter: Payer: Self-pay | Admitting: Pulmonary Disease

## 2013-12-29 VITALS — BP 130/94 | HR 72 | Ht 64.0 in | Wt 184.0 lb

## 2013-12-29 DIAGNOSIS — R071 Chest pain on breathing: Secondary | ICD-10-CM

## 2013-12-29 DIAGNOSIS — R0781 Pleurodynia: Secondary | ICD-10-CM

## 2013-12-29 NOTE — Assessment & Plan Note (Signed)
Will have her d/c prednisone.  Will f/u in 2 months with CXR.

## 2013-12-29 NOTE — Progress Notes (Signed)
Chief Complaint  Patient presents with  . Pleurisy    Would like to come off Prednisone.     History of Present Illness: Cynthia Cameron is a 53 y.o. female never smoker with pleurisy from [place] lower lobe PNA March 2014 and Lt pleural effusion.  She was confused about her instructions from last visit.  She remains on 2.5 mg prednisone qod.  She is not having chest pain, cough, sputum, fever, or hemoptysis.   She gets spasm in her back when she goes from laying to standing.  CXR from 09/24/13 was normal.  TESTS: CT chest 11/04/12 >> moderate pericardial fluid, borderline LAN, mod Lt effusion with passive ATX LLL  Lt thoracentesis 11/05/12 >> small volume removed, not enough for analysis  Doppler legs 11/06/12 >> DVT Lt posterior tibial and peroneal vein  CT chest 11/08/12 >> small/mod pericardial effusion, Lt > Rt pleural effusions, Rt pulmonary artery PE, ATX LLL  Echo 11/19/12 >> EF 50 to 55%  Lt thoracentesis 11/20/12 >>900 cc turbid fluid, protein 5.1, LDH 149, WBC 2948 (56N, 37L, 80M); Cytology >> mesothelial cells with calretinin, WT-1, cytokeratin 5/6 positive CT chest 11/25/12 >> small Lt > Rt pleural effusions, basilar ATX/consolidation, decreased pericardial effusion CT chest 05/15/13 >> ATX RML, no PE Doppler legs b/l 05/16/13 >> no DVT Labs 05/20/13 >> ESR 90, ANA negative, RF 12   Cynthia Cameron  has a past medical history of Allergy; Hypertension; Shortness of breath; High cholesterol; Pulmonary embolism on right (09/2012); DVT, lower extremity (10/2012); Chest pain; Type II diabetes mellitus; Iron deficiency anemia; Pneumonia (09/2012); Depression; and Bipolar depression.  Cynthia Cameron  has past surgical history that includes Cesarean section (1994; 1996) and Cardiac catheterization (09/2012).  Prior to Admission medications   Medication Sig Start Date End Date Taking? Authorizing Provider  amLODipine (NORVASC) 5 MG tablet TAKE 1 TABLET BY MOUTH EVERY DAY 01/02/13  Yes  Gay Filler Copland, MD  buPROPion (WELLBUTRIN) 100 MG tablet Take 50 mg by mouth daily.    Yes Historical Provider, MD  chlorpheniramine-HYDROcodone (TUSSIONEX) 10-8 MG/5ML LQCR Take 5 mLs by mouth every 12 (twelve) hours as needed (for cough/pain).   Yes Historical Provider, MD  cloNIDine (CATAPRES) 0.2 MG tablet Take 0.2 mg by mouth at bedtime.   Yes Historical Provider, MD  ferrous sulfate 325 (65 FE) MG EC tablet Take 325 mg by mouth daily.   Yes Historical Provider, MD  furosemide (LASIX) 40 MG tablet Take 1 tablet (40 mg total) by mouth daily. 11/28/12  Yes Birdie Riddle, MD  glucose blood (FREESTYLE [place]) test strip To check blood sugars tid dx code 250.00 12/30/12  Yes Gay Filler Copland, MD  guaiFENesin-dextromethorphan (ROBITUSSIN [place]) 100-10 MG/5ML syrup Take 5 mLs by mouth every 4 (four) hours as needed for cough. 12/25/12  Yes Birdie Riddle, MD  insulin aspart (NOVOLOG) 100 UNIT/ML injection Inject 0-15 Units into the skin 3 (three) times daily with meals. 12/25/12  Yes Birdie Riddle, MD  insulin glargine (LANTUS) 100 UNIT/ML injection Inject 0.2 mLs (20 Units total) into the skin at bedtime. 12/25/12  Yes Birdie Riddle, MD  Insulin Syringes, Disposable, U-100 0.5 ML MISC 3-20 Units by Does not apply route 4 (four) times daily -  before meals and at bedtime. 12/25/12  Yes Birdie Riddle, MD  linagliptin (TRADJENTA) 5 MG TABS tablet Take 5 mg by mouth daily.   Yes Historical Provider, MD  lisinopril (PRINIVIL,ZESTRIL) 20 MG tablet TAKE 1  TABLET (20 MG TOTAL) BY MOUTH DAILY. 12/25/12  Yes Heather M Marte, PA-C  Menthol-Methyl Salicylate (MUSCLE RUB) 10-15 % CREA Apply 1 application topically 2 (two) times daily.   Yes Historical Provider, MD  Multiple Vitamin (MULTIVITAMIN WITH MINERALS) TABS Take 1 tablet by mouth daily.   Yes Historical Provider, MD  nebivolol (BYSTOLIC) 10 MG tablet Take 10 mg by mouth daily.   Yes Historical Provider, MD  OLANZapine (ZYPREXA) 5 MG tablet Take 5 mg by mouth at  bedtime.   Yes Historical Provider, MD  oxyCODONE-acetaminophen (PERCOCET/ROXICET) 5-325 MG per tablet Take 1 tablet by mouth as needed. 12/25/12  Yes Birdie Riddle, MD  pravastatin (PRAVACHOL) 40 MG tablet TAKE 1 TABLET (40 MG TOTAL) BY MOUTH DAILY. 12/25/12  Yes Heather M Marte, PA-C  predniSONE (DELTASONE) 20 MG tablet Take 1 tablet (20 mg total) by mouth daily with breakfast. 11/28/12  Yes Birdie Riddle, MD  Rivaroxaban (XARELTO) 20 MG TABS TAKE 1 TABLET (20 MG TOTAL) BY MOUTH DAILY**BEGIN AFTER FINISHING 15 MG** 12/26/12  Yes Darreld Mclean, MD    No Known Allergies   Physical Exam:  General - No distress ENT - No sinus tenderness, no oral exudate, no LAN Cardiac - s1s2 regular, no murmur Chest - no wheeze, no pain on palpation Back - No focal tenderness Abd - Soft, non-tender Ext - no edema Neuro - Normal strength Skin - No rashes Psych - normal mood, and behavior  Assessment/Plan:  Chesley Mires, MD [place] [facility] Pager:  [phone]

## 2013-12-29 NOTE — Patient Instructions (Signed)
Stop prednisone Follow up in 2 months with chest xray

## 2014-01-26 ENCOUNTER — Telehealth: Payer: Self-pay | Admitting: Pulmonary Disease

## 2014-01-26 NOTE — Telephone Encounter (Signed)
Pt reports her diabetic doctor Dr. Wilson Singer was concerned about pt still be on xarelto even though clot has resolved. Pt wants to know if she can come off this bc she read this can cause bleeding long term. Was told by diabetic doc normally xarelto is stopped after 1 year. Please advise Dr. Halford Chessman thanks

## 2014-01-27 NOTE — Telephone Encounter (Signed)
lmomtcb x1 

## 2014-01-27 NOTE — Telephone Encounter (Signed)
He xarelto is being managed by her PCP.

## 2014-01-28 ENCOUNTER — Telehealth: Payer: Self-pay

## 2014-01-28 NOTE — Telephone Encounter (Addendum)
Pt aware of recs per VS Pt to contact Dr Lorelei Pont to have her address this issue--as Dr Lorelei Pont has refilled the medication the past few times filled.   Nothing further needed.

## 2014-01-28 NOTE — Telephone Encounter (Signed)
LMOM x 2 

## 2014-01-28 NOTE — Telephone Encounter (Signed)
Pt called because  Dr.Kohut ([phone]), who she sees for her Diabetes, requested that she goes off of her Rivaroxaban (XARELTO) 20 MG, he is concerned it will cause bleeding. Pt is just concerned about if she stops that her blood clots in her legg will come back. She would like to speak with Dr.Copland or nurse about what her recommendation would be, is also wondering hoe often she should be coming in to have her legs checked for clots.

## 2014-02-05 ENCOUNTER — Ambulatory Visit: Payer: 59 | Admitting: Internal Medicine

## 2014-02-09 ENCOUNTER — Telehealth: Payer: Self-pay

## 2014-02-09 DIAGNOSIS — D6859 Other primary thrombophilia: Secondary | ICD-10-CM

## 2014-02-09 NOTE — Telephone Encounter (Signed)
Called her back- I have not heard back from her pulmonologist regarding this issue.  She reports that when she saw him last he deferred decision regarding her xarelto to me. I am not sure if she can ever stop anticoagulation as she is positive for lupus anticoagulant.  We will refer her to hematology to help [place] answer this question.

## 2014-02-09 NOTE — Telephone Encounter (Signed)
Spoke to patient she states that she saw Dr. Wilson Singer and he wants to know if it is okay to stop the [facility].  He told patient that taking the medication for extended periods of time can cause internal bleeding.  Patient is concerned and wants to know if she can stop the medication.

## 2014-02-09 NOTE — Telephone Encounter (Signed)
PT STATES SHE HAD CALLED FOR A RETURN CALL FROM DR COPLAND LAST WEEK AND KNEW SHE WAS ON VACATION. DIDN'T WANT TO GO INTO DETAILS. [place] [phone]

## 2014-03-24 IMAGING — CR DG CERVICAL SPINE 2 OR 3 VIEWS
3 series · 3 of 3 positions shown · non-contrast
Comparison: None.

CLINICAL DATA: 52-year-old female with neck injury and pain.

EXAM:
CERVICAL SPINE - 2-3 VIEW

[lateral]
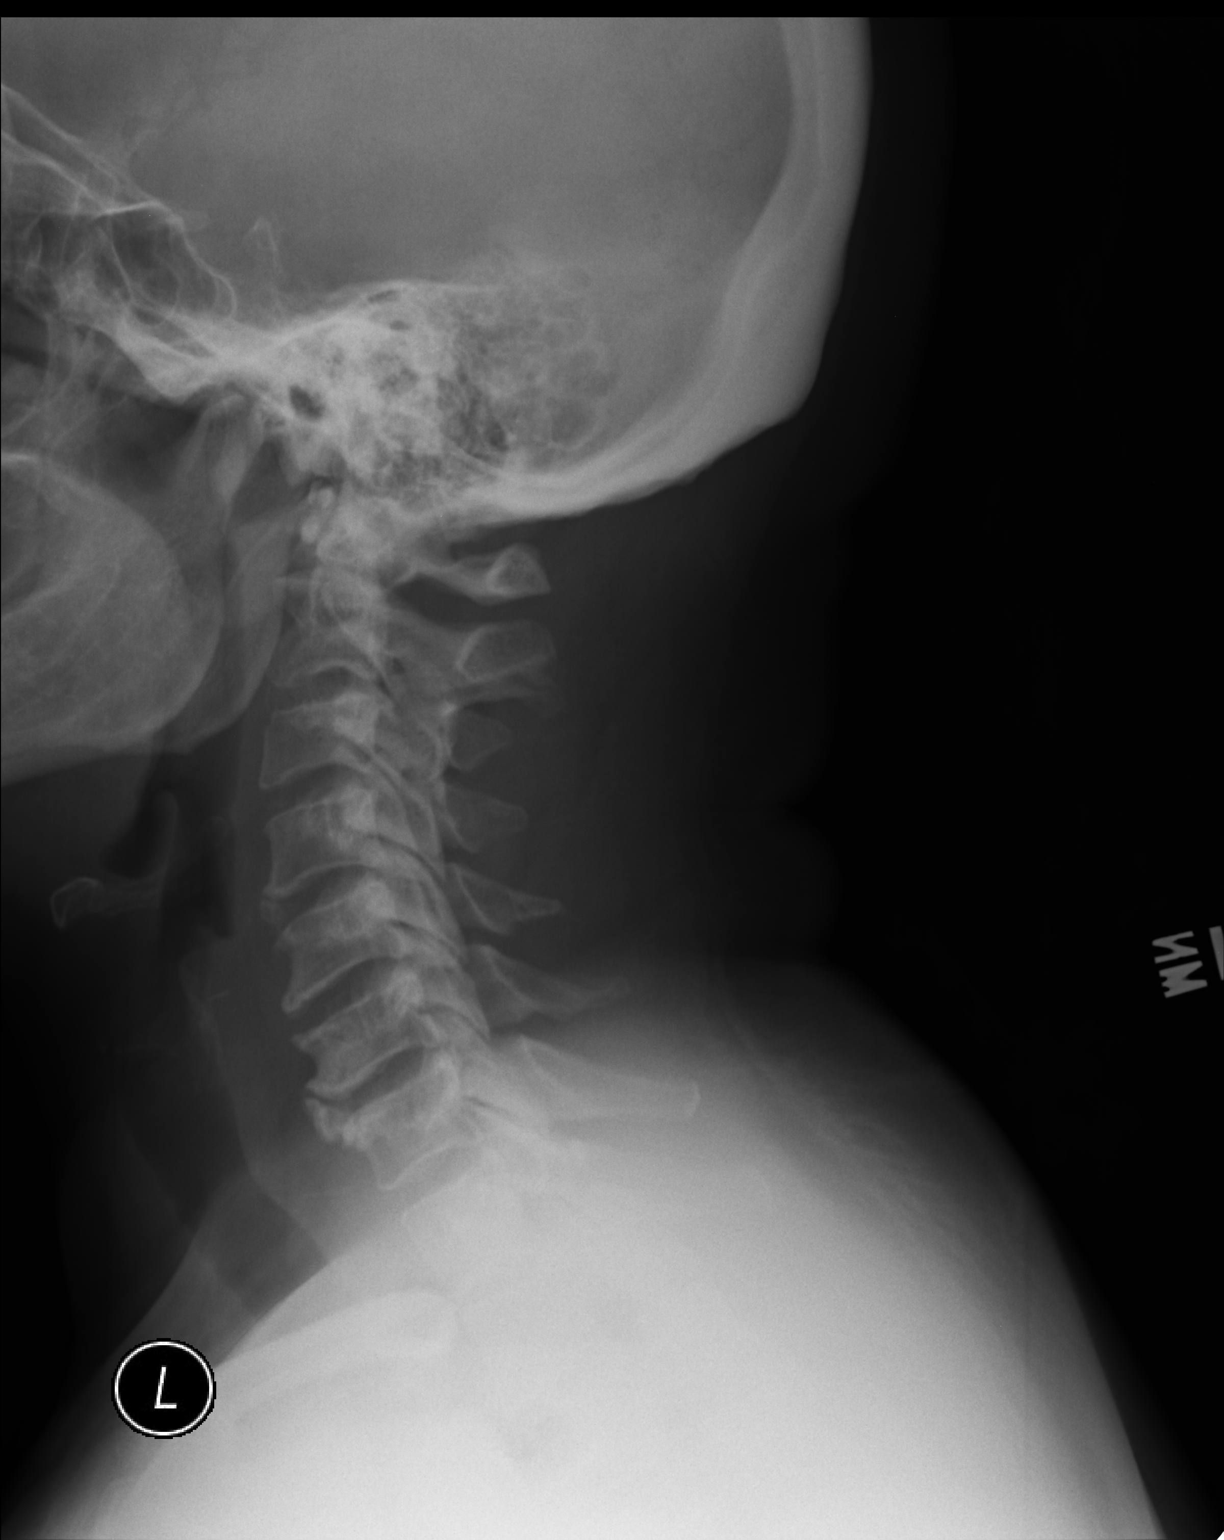

[AP (1 of 2)]
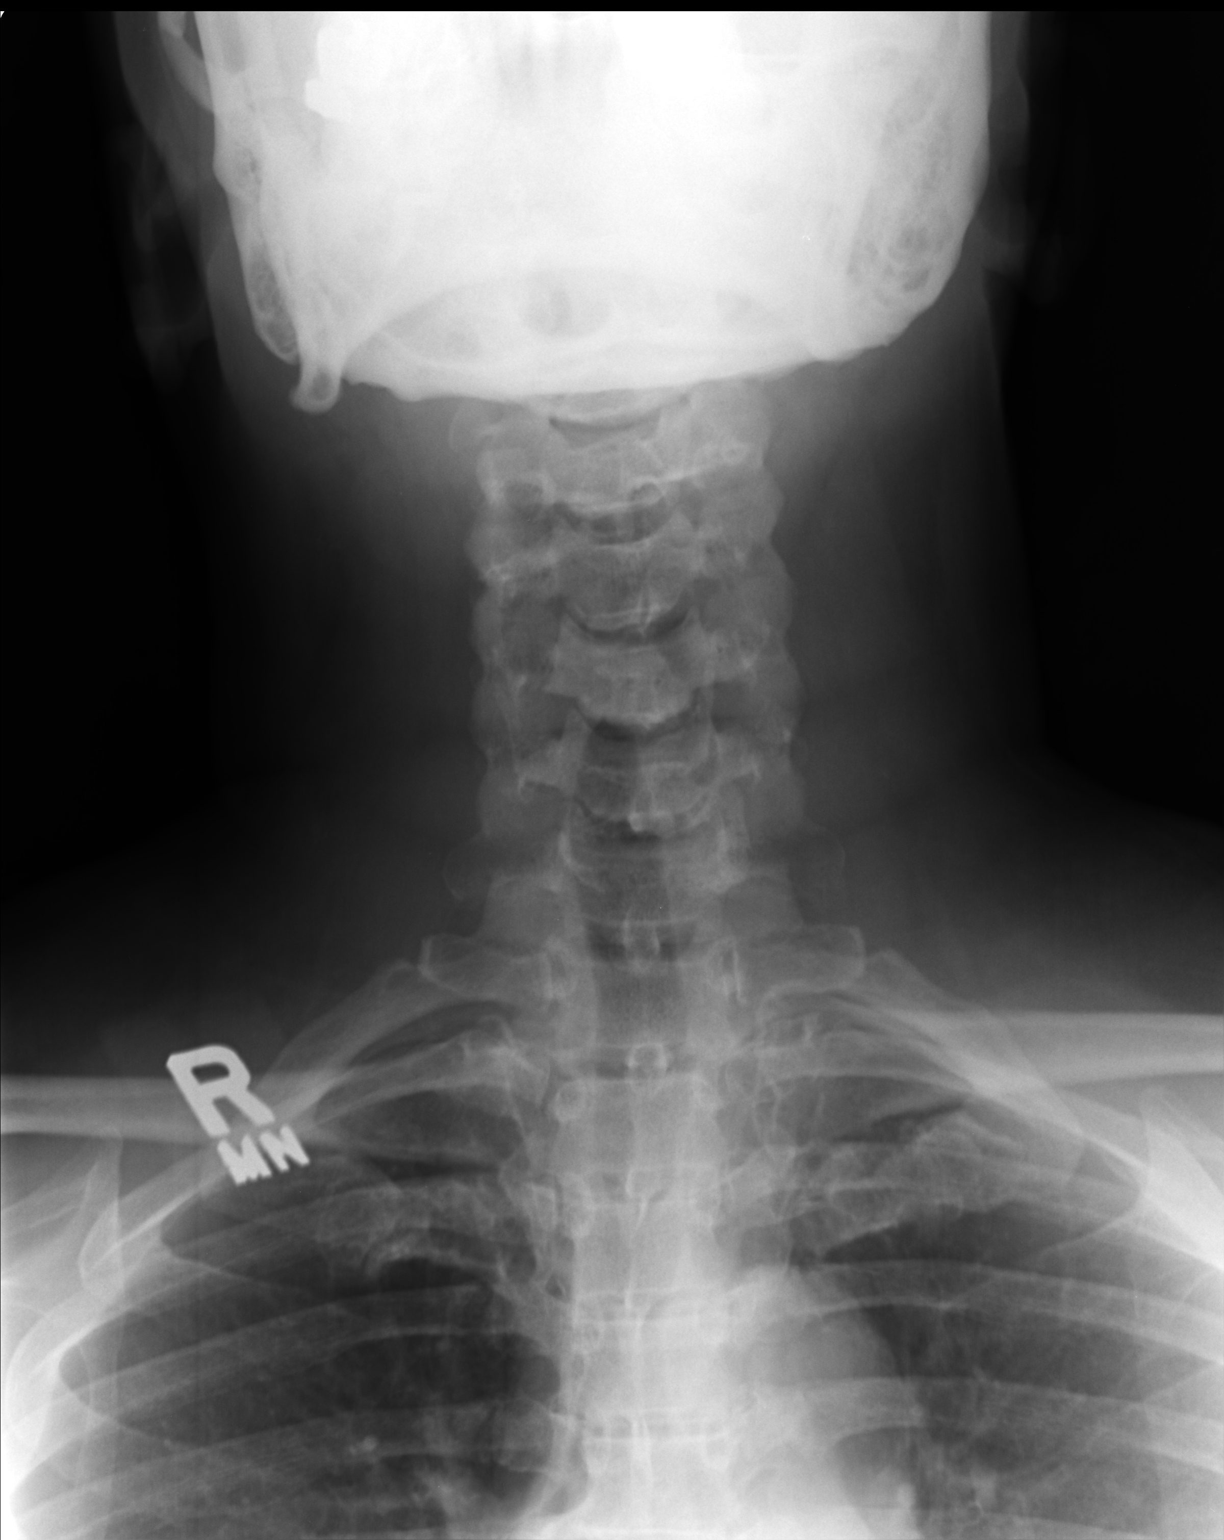

[AP (2 of 2)]
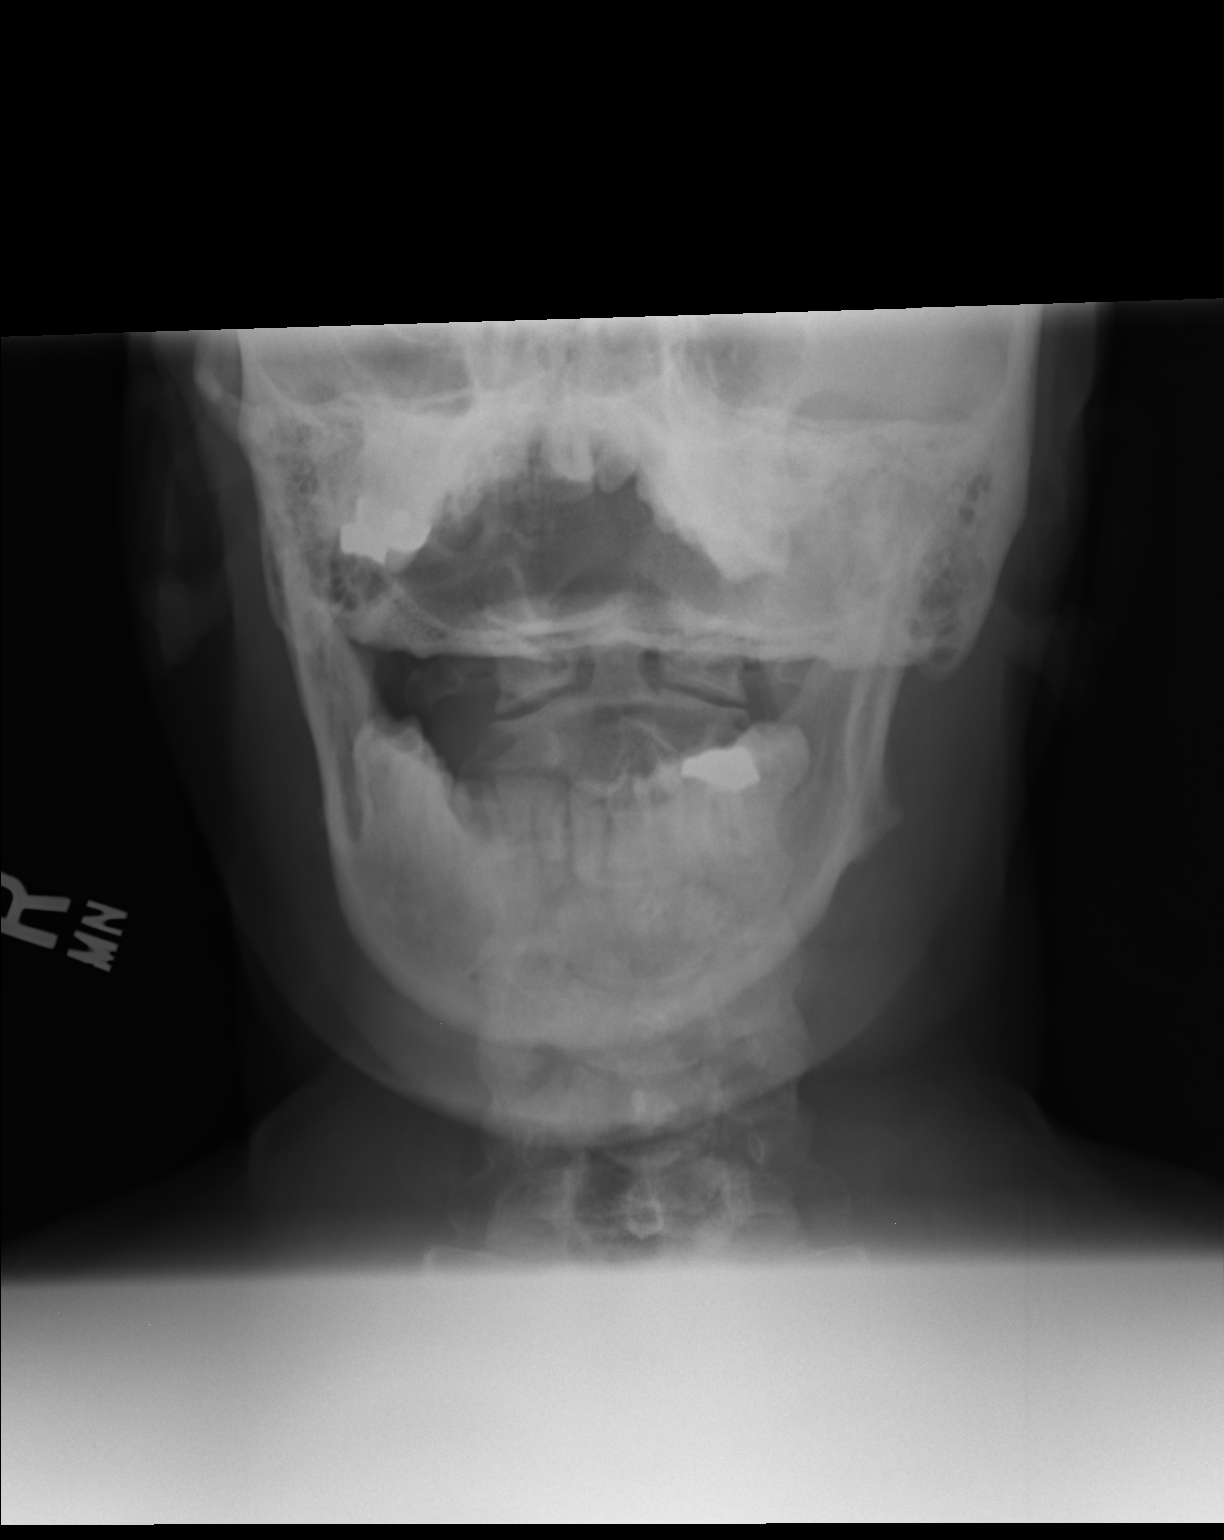

[3 of 3 positions shown; findings below may reference images not displayed]

FINDINGS: Normal alignment is noted.

There is no evidence of fracture, subluxation or prevertebral soft
tissue swelling.

Mild multilevel degenerative disc disease and facet arthropathy
noted.

No focal bony lesions are present.
IMPRESSION: No static evidence of acute injury to the cervical spine.

Mild multilevel degenerative changes.

## 2014-03-24 IMAGING — CT CT HEAD W/O CM
2 series · 16 of 30 positions shown, 18 images · non-contrast
Comparison: None

CLINICAL DATA: Fall, head injury, headache

EXAM:
CT HEAD WITHOUT CONTRAST
TECHNIQUE: Contiguous axial images were obtained from the base of the skull
through the vertex without contrast.

[Series 2: head w/o · axial · non-contrast · 0.40mm/px · z∈[+107,+217]mm · 8 of 30 slices shown, 10 images]
[im 4/30  brain]
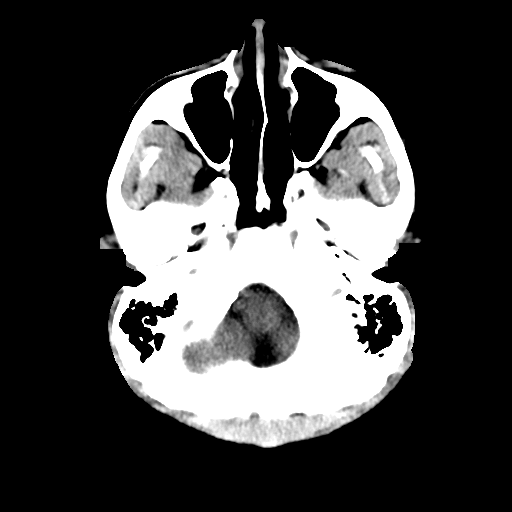
[im 4/30  bone]
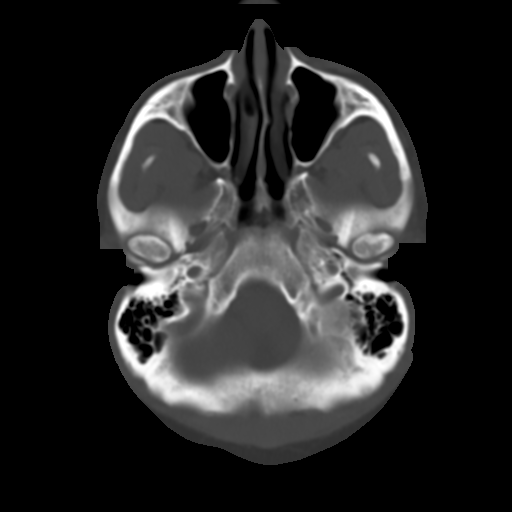
[im 7/30  brain]
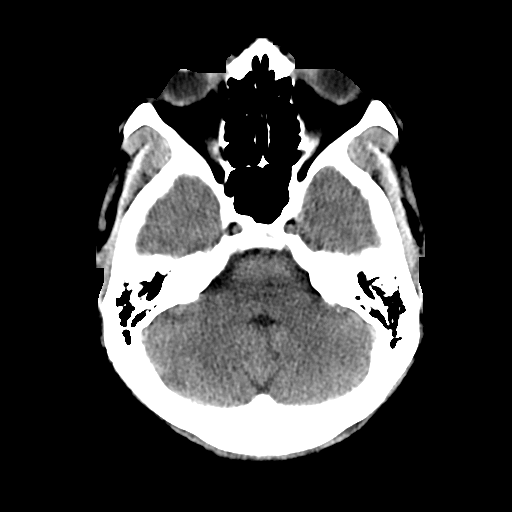
[im 10/30  brain]
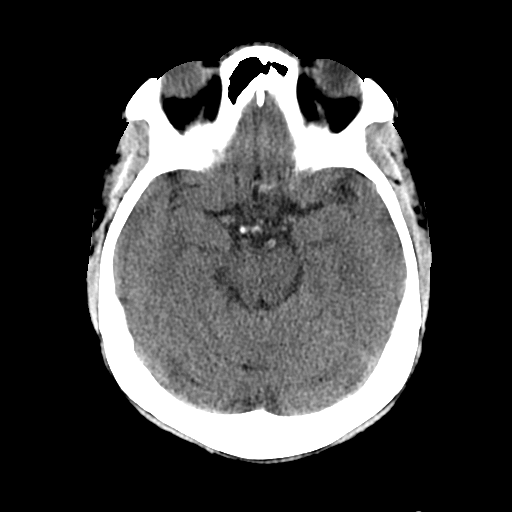
[im 13/30  brain]
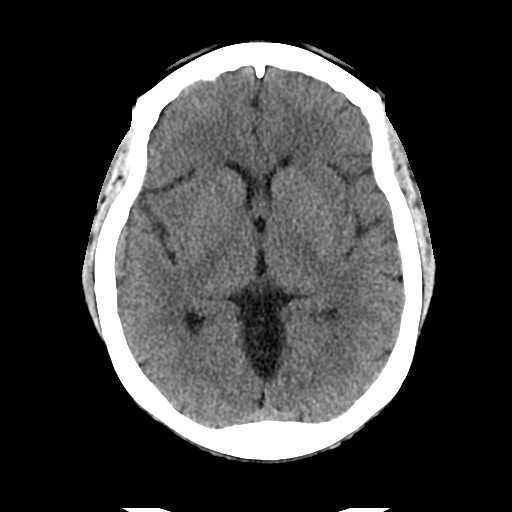
[im 17/30  brain]
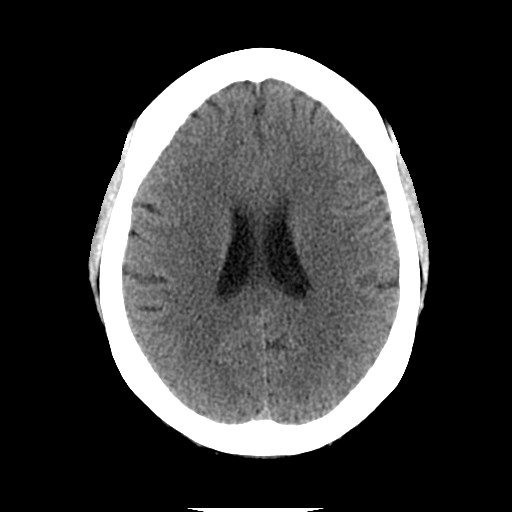
[im 17/30  bone]
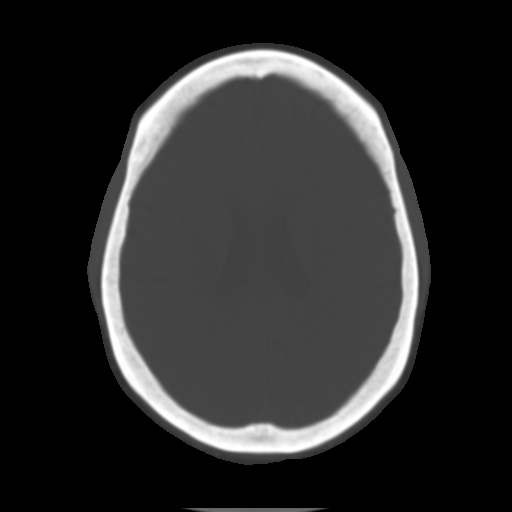
[im 20/30  brain]
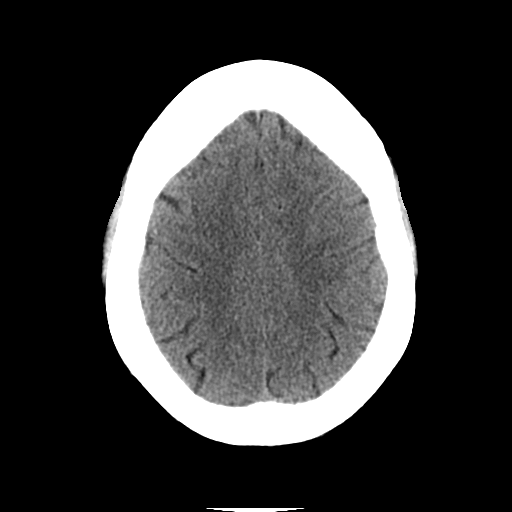
[im 23/30  brain]
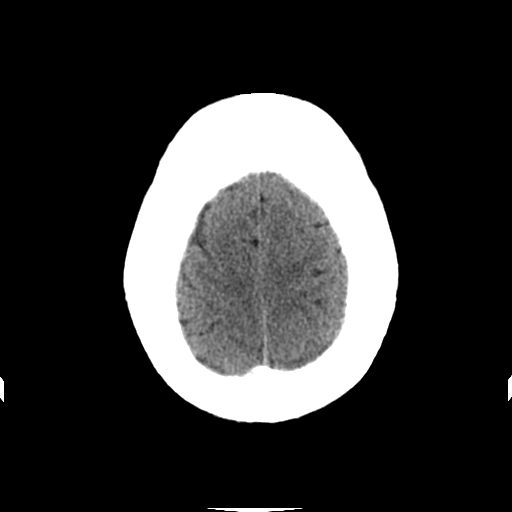
[im 26/30  brain]
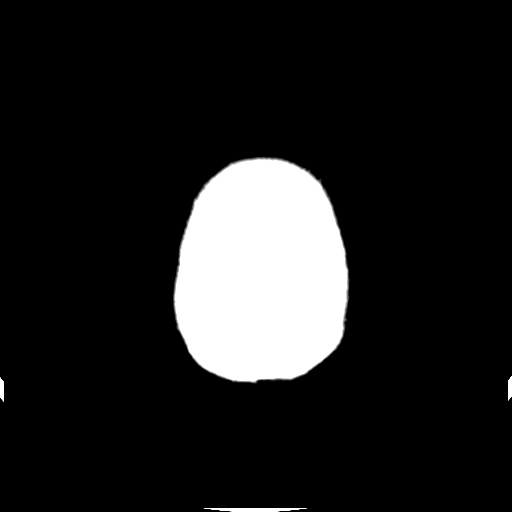

[Series 3: head w/o bone · axial · non-contrast · 0.40mm/px · z∈[+107,+219]mm · 8 of 59 slices shown]
[im 7/59  bone]
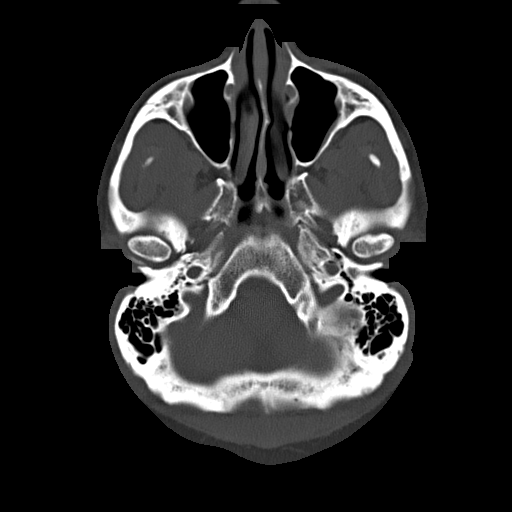
[im 13/59  bone]
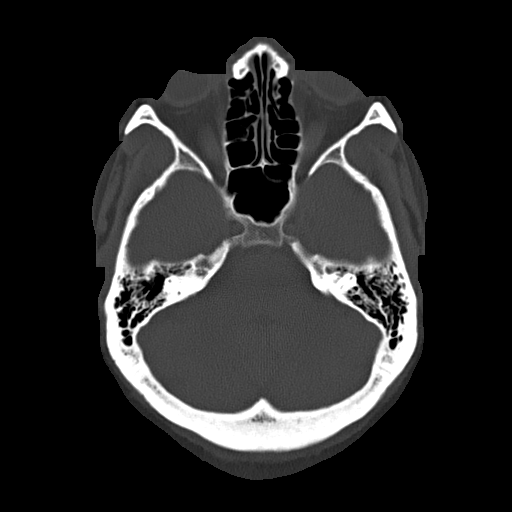
[im 19/59  bone]
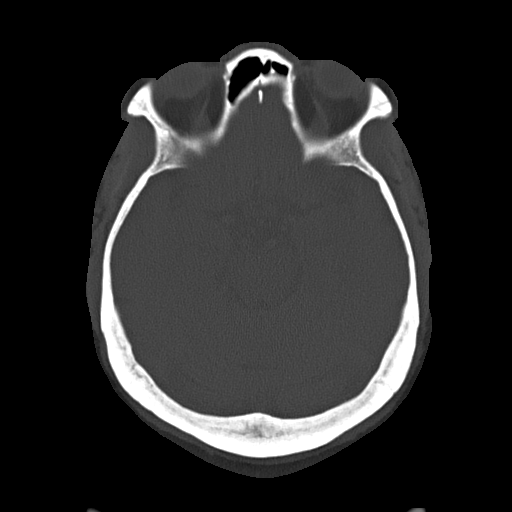
[im 25/59  bone]
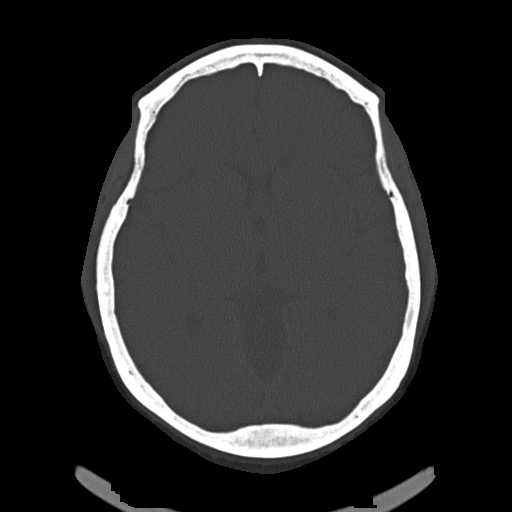
[im 34/59  bone]
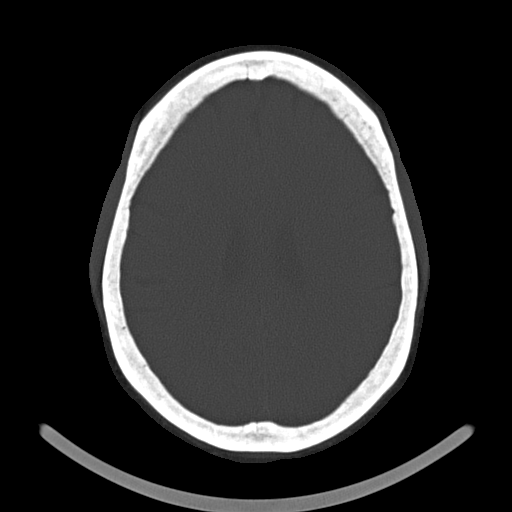
[im 40/59  bone]
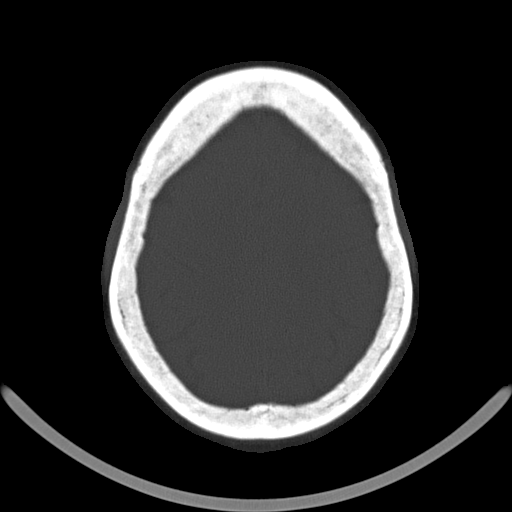
[im 46/59  bone]
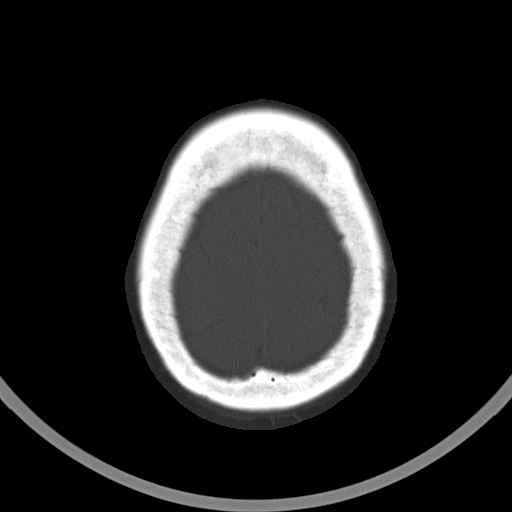
[im 52/59  bone]
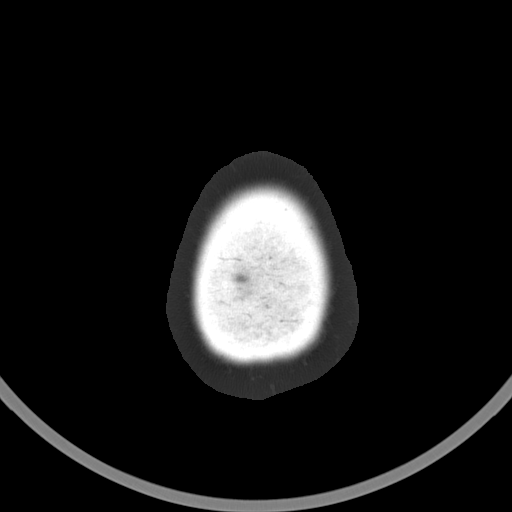

[16 of 30 positions shown; findings below may reference images not displayed]

FINDINGS: Normal appearance of the intracranial structures. No evidence for
acute hemorrhage, mass lesion, midline shift, hydrocephalus or large
infarct. No acute bony abnormality. The visualized sinuses are
clear.
IMPRESSION: No acute intracranial abnormality.

## 2014-05-11 ENCOUNTER — Ambulatory Visit (INDEPENDENT_AMBULATORY_CARE_PROVIDER_SITE_OTHER): Payer: 59 | Admitting: Oncology

## 2014-05-11 ENCOUNTER — Encounter: Payer: Self-pay | Admitting: Oncology

## 2014-05-11 VITALS — BP 127/79 | HR 79 | Temp 98.0°F | Ht 64.0 in | Wt 177.4 lb

## 2014-05-11 DIAGNOSIS — I82409 Acute embolism and thrombosis of unspecified deep veins of unspecified lower extremity: Secondary | ICD-10-CM

## 2014-05-11 DIAGNOSIS — I2699 Other pulmonary embolism without acute cor pulmonale: Secondary | ICD-10-CM

## 2014-05-11 DIAGNOSIS — I82402 Acute embolism and thrombosis of unspecified deep veins of left lower extremity: Secondary | ICD-10-CM

## 2014-05-11 LAB — RETICULOCYTES
ABS RETIC: 65.6 10*3/uL (ref 19.0–186.0)
RBC.: 5.21 MIL/uL — AB (ref 3.87–5.11)
Retic Ct Pct: 1.26 % (ref 0.4–2.3)

## 2014-05-11 LAB — CBC WITH DIFFERENTIAL/PLATELET
BASOS ABS: 0 10*3/uL (ref 0.0–0.1)
Basophils Relative: 0 % (ref 0–1)
EOS ABS: 0.2 10*3/uL (ref 0.0–0.7)
EOS PCT: 3 % (ref 0–5)
HCT: 41.3 % (ref 36.0–46.0)
Hemoglobin: 14 g/dL (ref 12.0–15.0)
LYMPHS ABS: 1.7 10*3/uL (ref 0.7–4.0)
Lymphocytes Relative: 24 % (ref 12–46)
MCH: 26.9 pg (ref 26.0–34.0)
MCHC: 33.9 g/dL (ref 30.0–36.0)
MCV: 79.3 fL (ref 78.0–100.0)
Monocytes Absolute: 0.4 10*3/uL (ref 0.1–1.0)
Monocytes Relative: 6 % (ref 3–12)
Neutro Abs: 4.8 10*3/uL (ref 1.7–7.7)
Neutrophils Relative %: 67 % (ref 43–77)
Platelets: 297 10*3/uL (ref 150–400)
RBC: 5.21 MIL/uL — ABNORMAL HIGH (ref 3.87–5.11)
RDW: 14.2 % (ref 11.5–15.5)
WBC: 7.2 10*3/uL (ref 4.0–10.5)

## 2014-05-11 LAB — SEDIMENTATION RATE: Sed Rate: 43 mm/hr — ABNORMAL HIGH (ref 0–22)

## 2014-05-11 LAB — D-DIMER, QUANTITATIVE: D-Dimer, Quant: <0.27 ug/mL-FEU (ref 0.00–0.48)

## 2014-05-11 LAB — LACTATE DEHYDROGENASE: LDH: 225 U/L (ref 94–250)

## 2014-05-11 NOTE — Progress Notes (Signed)
Patient ID: DEBHORA TITUS, female   DOB: 1960-12-08, 53 y.o.   MRN: [id number] New Patient Hematology   JAICE LAGUE [id number] [birth year] 53 y.o. 05/11/2014  CC: Dr. Gareth Eagle; Dr. Fleet Contras; Dr. 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 She presented in March 2014 with a one-week history of progressive dyspnea and pleuritic chest pain. A CT angiogram of the chest on 11/08/2012 showed pulmonary emboli in the right upper and right middle lobes with concomitant bilateral pleural effusions. Venous Doppler study showed acute DVT in the left posterior tibial and left peroneal veins. She had no history of trauma, no prolonged travel, no estrogens or oral contraceptives. No signs or symptoms of a collagen vascular disorder but because a maternal first cousin had lupus, she was screened with multiple lab tests which were negative including ANA, and anti-double-stranded DNA. ESR was reproducibly elevated over 80 mm. A rheumatoid factor was positive at 22 units normal less than 14, 11/06/2012. A hypercoagulation evaluation showed normal antithrombin III, 92% of control (75-120), protein C activity 127% (75-133), total protein C 61% (72-160), total protein as 97% (60-150), protein S activity 162% (62-831), a lupus anticoagulant was positive but likely done when she was receiving heparin. No detectable antibodies to anticardiolipin or beta-2 glycoprotein 1. Factor V Leiden gene mutation not detected. Plasma homocystine normal. Acute hepatitis profile AB and C. negative. HIV negative. A vasculitis profile was negative for c-ANCA, p-ANCA, and antimyeloperoxidase with borderline elevation of anti-proteinase 3 antibodies 5.1 units (0-3.5). A baseline d-dimer was not recorded. Serum protein electrophoresis showed no monoclonal proteins on  06/02/2013. A followup CT angiogram scan of the chest on May 14, 2013 showed resolution of previous clots. No additional pathology. She is up-to-date on her health maintenance exams. She gets annual Pap smears and mammograms. Most recent mammogram 12/04/2013. She had a colonoscopy last month in August 2015. One small polyp removed. Study otherwise normal. ( Dr. Collene Mares). She has lost about 10 pounds on a product called herbal life. She's been able to decrease her insulin requirements. She is menopausal x5 years. First pregnancy uncomplicated. Second pregnancy she developed preeclampsia requiring emergency C-section at 7 months. She had an elective C-section at near term of her third pregnancy. No history of recurrent miscarriage.  No family history of clotting. Mother has diabetes, father hypertension, one brother committed suicide at age 104, 61 is alcoholic, one with hypertension. A sister age 58 healthy. 3 children all boys age 24, 55 and 57 all healthy.  Allergy. None   PMH: Past Medical History  Diagnosis Date  . Allergy   . Hypertension   . Shortness of breath   . High cholesterol   . Pulmonary embolism on right 09/2012  . DVT, lower extremity 10/2012    "2" (12/24/2012)  . Chest pain     "since 09/2012; hurts all the time" (12/24/2012)  . Type II diabetes mellitus currently on Lantus insulin and victoza Glucophage stopped.    . Iron deficiency anemia   . Pneumonia 09/2012  . Depression   . Bipolar depression     Past Surgical History  Procedure Laterality Date  . Cesarean section  1994; 1996  . Cardiac catheterization  09/2012    Allergies: No Known Allergies  Medications: Current outpatient prescriptions:amLODipine (NORVASC) 5 MG tablet, TAKE 1 TABLET BY MOUTH EVERY DAY, Disp: 30 tablet,  Rfl: 4;  buPROPion (WELLBUTRIN) 100 MG tablet, Take 50 mg by mouth daily. , Disp: , Rfl: ;  cloNIDine (CATAPRES) 0.2 MG tablet, Take 0.2 mg by mouth at bedtime., Disp: , Rfl: ;   clotrimazole-betamethasone (LOTRISONE) cream, Apply 1 application topically 2 (two) times daily., Disp: 30 g, Rfl: 0 cyclobenzaprine (FLEXERIL) 10 MG tablet, Take 1 tablet (10 mg total) by mouth 3 (three) times daily as needed for muscle spasms., Disp: 30 tablet, Rfl: 0;  furosemide (LASIX) 40 MG tablet, Take 1 tablet (40 mg total) by mouth daily., Disp: 30 tablet, Rfl: 1;  glucose blood (FREESTYLE LITE) test strip, To check blood sugars tid dx code 250.00, Disp: 100 each, Rfl: 3 insulin aspart (NOVOLOG) 100 UNIT/ML injection, Inject 0-15 Units into the skin 3 (three) times daily with meals., Disp: 1 vial, Rfl: 1;  insulin glargine (LANTUS) 100 UNIT/ML injection, Inject 0.2 mLs (20 Units total) into the skin at bedtime., Disp: 10 mL, Rfl: 1;  Insulin Syringes, Disposable, U-100 0.5 ML MISC, 3-20 Units by Does not apply route 4 (four) times daily -  before meals and at bedtime., Disp: 120 each, Rfl: 1 KLOR-CON 10 10 MEQ tablet, Take 2 tablets by mouth 2 (two) times daily., Disp: , Rfl: ;  Liraglutide (VICTOZA [place]), Inject 0.6 Units into the skin daily., Disp: , Rfl: ;  lisinopril (PRINIVIL,ZESTRIL) 20 MG tablet, Take 1 tablet (20 mg total) by mouth daily. PATIENT NEEDS OFFICE VISIT FOR ADDITIONAL REFILLS, Disp: 30 tablet, Rfl: 0;  meloxicam (MOBIC) 15 MG tablet, Take 15 mg by mouth daily as needed., Disp: , Rfl:  Multiple Vitamin (MULTIVITAMIN WITH MINERALS) TABS, Take 1 tablet by mouth daily., Disp: , Rfl: ;  nebivolol (BYSTOLIC) 10 MG tablet, Take 10 mg by mouth daily., Disp: , Rfl: ;  OLANZapine (ZYPREXA) 5 MG tablet, Take 5 mg by mouth at bedtime., Disp: , Rfl: ;  oxyCODONE-acetaminophen (PERCOCET/ROXICET) 5-325 MG per tablet, Take 1 tablet by mouth as needed., Disp: 30 tablet, Rfl: 0 pravastatin (PRAVACHOL) 40 MG tablet, TAKE 1 TABLET (40 MG TOTAL) BY MOUTH DAILY., Disp: 90 tablet, Rfl: 1;  Rivaroxaban (XARELTO) 20 MG TABS tablet, Take 1 tablet (20 mg total) by mouth daily., Disp: 30 tablet, Rfl: 5  Social  History: Charity fundraiser. 3 healthy boys.  reports that she has never smoked.  She reports that she drinks rare alcohol. she does not use illicit drugs.  Family History: Family History  Problem Relation Age of Onset  . Hypertension Father   . Diabetes Mother   . Atrial fibrillation Mother     Review of Systems: Hematology: negative for swollen glands, easy bruising, epistaxis, gum bleeding, hematuria, hematochezia ENT ROS: negative for - oral lesions or sore throat Breast ROS: negative for - new or changing breast lumps. She is current on annual mammograms Respiratory ROS: negative for - cough, pleuritic pain, shortness of breath or wheezing Cardiovascular ROS: negative for - chest pain, dyspnea on exertion, edema, irregular heartbeat, murmur,  palpitations,  or rapid heart rate Gastrointestinal ROS: negative for - abdominal pain, appetite loss, blood in stools, change in bowel habits, constipation, diarrhea, heartburn, hematemesis, melena, nausea/vomiting or swallowing difficulty/pain Genito-Urinary ROS: negative for - dysuria, hematuria, incontinence, irregular/heavy menses,  or urinary frequency/urgency Musculoskeletal ROS: negative for - joint pain, joint stiffness, joint swelling, muscle pain, muscular weakness or pain  Neurological ROS: negative for - behavioral changes, confusion, dizziness, gait disturbance, headaches, impaired coordination/balance, memory loss, numbness/tingling,  Dermatological ROS: negative for rash, ecchymosis. She  has had a recurrent sebaceous cyst on her back. Remaining ROS negative.  Physical Exam: Blood pressure 127/79, pulse 79, temperature 98 F (36.7 C), temperature source Oral, height '5\' 4"'  (1.626 m), weight 177 lb 6.4 oz (80.468 kg), last menstrual period 09/16/2010, SpO2 97.00%. Wt Readings from Last 3 Encounters:  05/11/14 177 lb 6.4 oz (80.468 kg)  12/29/13 184 lb (83.462 kg)  11/23/13 187 lb (84.823 kg)     General appearance: Well-nourished  African American woman HENNT: Pharynx no erythema, exudate, mass, or ulcer. Thyroid symmetrically enlarged no  thyroid nodules Lymph nodes: No cervical, supraclavicular, or axillary lymphadenopathy Breasts:  Lungs: Clear to auscultation, resonant to percussion throughout Heart: Regular rhythm, no murmur, no gallop, no rub, no click, no edema Abdomen: Soft, nontender, normal bowel sounds, no mass, no organomegaly Extremities: No edema, no calf tenderness Musculoskeletal: no joint deformities GU:  Vascular: Carotid pulses 2+, no bruits, distal pulses: Dorsalis pedis 1+ symmetric Neurologic: Alert, oriented, PERRLA, optic discs sharp and vessels normal, no hemorrhage or exudate, cranial nerves grossly normal, motor strength 5 over 5, reflexes 1+ symmetric, upper body coordination normal, gait normal, Skin: No rash or ecchymosis    Lab Results: Lab Results  Component Value Date   WBC 7.2 05/11/2014   HGB 14.0 05/11/2014   HCT 41.3 05/11/2014   MCV 79.3 05/11/2014   PLT 297 05/11/2014     Chemistry      Component Value Date/Time   NA 142 09/24/2013 1736   K 3.1* 09/24/2013 1736   CL 102 09/24/2013 1736   CO2 30 09/24/2013 1736   BUN 15 09/24/2013 1736   CREATININE 1.48* 09/24/2013 1736   CREATININE 1.8* 05/20/2013 1525      Component Value Date/Time   CALCIUM 10.2 09/24/2013 1736   ALKPHOS 103 09/24/2013 1736   AST 17 09/24/2013 1736   ALT 16 09/24/2013 1736   BILITOT 0.4 09/24/2013 1736        Radiological Studies: See discussion above    Impression and Plan: Unprovoked pulmonary embolus with no obvious congenital or acquired risk factors.  We had a long discussion about risk versus benefit of long-term anticoagulants. A recently published study in the Journal of the American medical association 03/03/2014 corroborates previous studies in showing that there is an ongoing, significant, risk of rethrombosis, in patients with unprovoked pulmonary emboli who discontinue anticoagulants.  This study also demonstrates that good protection is provided while patients are on therapeutic doses of anticoagulants. I am going to check a baseline D.-dimer which may also have some predictive value in deciding on duration of anticoagulation. I'm also going to check a prothrombin gene mutation study which was not done as part of her initial evaluation although it will not change my recommendation to continue long-term anticoagulation.  I told her I would like to see her once a year to update her on any changes in the field. There may be some choices for the future such as changing to a low dose anticoagulant or aspirin after defined. On full dose anticoagulation. She needs to be monitored for bleeding complications in view of her diabetes and renal insufficiency since bleeding risk is increased in this population.      Annia Belt, MD 05/11/2014, 7:49 PM

## 2014-05-11 NOTE — Patient Instructions (Signed)
To lab today we will call you with results if anything abnormal.   Stay on your Xarelto  Return visit with Hematologist  1 year   CBC day of visit

## 2014-05-14 LAB — PROTHROMBIN GENE MUTATION

## 2014-05-21 ENCOUNTER — Telehealth: Payer: Self-pay | Admitting: *Deleted

## 2014-05-21 NOTE — Telephone Encounter (Signed)
Message copied by Ebbie Latus on Thu May 21, 2014 11:18 AM ------      Message from: Annia Belt      Created: Wed May 20, 2014 11:40 AM       Call patient: d-dimer test of activation of clotting system is normal: this is goo      One genetic test not done before for elevation of clotting factor 2 is normal.      No change in recommendation from time of her visit: continue blood thinners for now ------

## 2014-05-21 NOTE — Telephone Encounter (Signed)
Called pt - pt informed D-dimer test is normal which is good and genetic test for elevation of clotting factor 2 is normal; nn change in recommendation from time of her visit and continue blood thinners  For now per Dr Beryle Beams. Pt voiced understanding.

## 2014-05-23 ENCOUNTER — Other Ambulatory Visit (HOSPITAL_COMMUNITY): Payer: Self-pay | Admitting: Family Medicine

## 2014-05-29 ENCOUNTER — Other Ambulatory Visit: Payer: Self-pay | Admitting: *Deleted

## 2014-05-29 NOTE — Telephone Encounter (Signed)
States she is completely out of medication.

## 2014-05-29 NOTE — Telephone Encounter (Signed)
Pt called/informed to call her PCP to refill Xarelto; stated she doesn't have a PCP but will call Dr Eugenio Hoes office.

## 2014-06-01 NOTE — Telephone Encounter (Signed)
Open in error

## 2014-06-04 ENCOUNTER — Other Ambulatory Visit (HOSPITAL_COMMUNITY): Payer: Self-pay | Admitting: Family Medicine

## 2014-07-03 ENCOUNTER — Other Ambulatory Visit: Payer: Self-pay | Admitting: Physician Assistant

## 2014-07-06 ENCOUNTER — Other Ambulatory Visit: Payer: Self-pay | Admitting: Oncology

## 2014-07-06 NOTE — Telephone Encounter (Signed)
Cynthia Cameron - she should get her Xarelto refilled by her primary care MD DrG

## 2014-07-06 NOTE — Telephone Encounter (Signed)
Pt called; no answer - left message for pt to call her PCP for her next Xarelto refill and to call if she has any questions.

## 2014-07-14 ENCOUNTER — Telehealth: Payer: Self-pay | Admitting: Pulmonary Disease

## 2014-07-14 NOTE — Telephone Encounter (Signed)
Called and spoke to pt. Pt questioning if she needed to keep appt on 11/19. Advised pt per VS OV note from 12/2013 pt was to f/u in 02/2014 with CXR. Advised pt to keep upcoming appt. Pt verbalized understanding and denied any further questions or concerns at this time.

## 2014-07-16 ENCOUNTER — Encounter: Payer: Self-pay | Admitting: Pulmonary Disease

## 2014-07-16 ENCOUNTER — Ambulatory Visit (INDEPENDENT_AMBULATORY_CARE_PROVIDER_SITE_OTHER)
Admission: RE | Admit: 2014-07-16 | Discharge: 2014-07-16 | Disposition: A | Discharge status: Home / Self Care | Payer: 59 | Source: Ambulatory Visit | Attending: Pulmonary Disease | Admitting: Pulmonary Disease

## 2014-07-16 ENCOUNTER — Ambulatory Visit (INDEPENDENT_AMBULATORY_CARE_PROVIDER_SITE_OTHER): Payer: 59 | Admitting: Pulmonary Disease

## 2014-07-16 VITALS — BP 132/92 | HR 84 | Temp 97.3°F | Ht 64.0 in | Wt 183.0 lb

## 2014-07-16 DIAGNOSIS — I2699 Other pulmonary embolism without acute cor pulmonale: Secondary | ICD-10-CM

## 2014-07-16 DIAGNOSIS — J90 Pleural effusion, not elsewhere classified: Secondary | ICD-10-CM

## 2014-07-16 DIAGNOSIS — R0781 Pleurodynia: Secondary | ICD-10-CM

## 2014-07-16 MED ORDER — RIVAROXABAN 20 MG PO TABS: ORAL_TABLET | ORAL | Status: DC

## 2014-07-16 NOTE — Patient Instructions (Signed)
Follow up with pulmonary as needed 

## 2014-07-16 NOTE — Assessment & Plan Note (Signed)
She is to continue on xarelto.  She will f/u with her PCP and hematology for further management of this.  I have refilled her xarelto for one month, but advised future refills will need to come from her PCP.  Also advised how renal dysfunction can affect her ability to clear xarelto, and that she should discuss this with her nephrologist.

## 2014-07-16 NOTE — Assessment & Plan Note (Signed)
Resolved

## 2014-07-16 NOTE — Progress Notes (Signed)
Chief Complaint  Patient presents with  . Follow-up    Cxr today. No compliants.    History of Present Illness: Cynthia Cameron is a 53 y.o. female never smoker with pleurisy from [place] lower lobe PNA March 2014 and Lt pleural effusion.  Since her last visit she was seen by Dr. Beryle Beams with hematology.  She was advised to continue xarelto.  She is followed by Dr. Mercy Moore for her kidney disease, and was advised that he could also function as her PCP.  She has not had any more trouble with cough, chest pain, or shortness of breath.  She denies skin rash or leg swelling.  TESTS: CT chest 11/04/12 >> moderate pericardial fluid, borderline LAN, mod Lt effusion with passive ATX LLL  Lt thoracentesis 11/05/12 >> small volume removed, not enough for analysis  Doppler legs 11/06/12 >> DVT Lt posterior tibial and peroneal vein  CT chest 11/08/12 >> small/mod pericardial effusion, Lt > Rt pleural effusions, Rt pulmonary artery PE, ATX LLL  Echo 11/19/12 >> EF 50 to 55%  Lt thoracentesis 11/20/12 >>900 cc turbid fluid, protein 5.1, LDH 149, WBC 2948 (56N, 37L, 73M); Cytology >> mesothelial cells with calretinin, WT-1, cytokeratin 5/6 positive CT chest 11/25/12 >> small Lt > Rt pleural effusions, basilar ATX/consolidation, decreased pericardial effusion CT chest 05/15/13 >> ATX RML, no PE Doppler legs b/l 05/16/13 >> no DVT Labs 05/20/13 >> ESR 90, ANA negative, RF 12  PMHx, PSHx, Medications, Allergies, Fhx, Shx reviewed.  Physical Exam:  General - No distress ENT - No sinus tenderness, no oral exudate, no LAN Cardiac - s1s2 regular, no murmur Chest - no wheeze, no pain on palpation Back - No focal tenderness Abd - Soft, non-tender Ext - no edema Neuro - Normal strength Skin - No rashes Psych - normal mood, and behavior   Dg Chest 2 View  07/16/2014   CLINICAL DATA:  Pleuritic chest pain. Pleural effusion in March 2014.  EXAM: CHEST  2 VIEW  COMPARISON:  Chest x-rays dated 09/24/2013,  05/20/2013 and 11/20/2012  FINDINGS: The heart size and mediastinal contours are within normal limits. Both lungs are clear. No effusions. The visualized skeletal structures are unremarkable.  IMPRESSION: Essentially normal exam.  No recurrent effusion.   Electronically Signed   By: Rozetta Nunnery M.D.   On: 07/16/2014 15:36     Assessment/Plan:  Chesley Mires, MD [facility] Pager:  [phone]

## 2014-07-16 NOTE — Assessment & Plan Note (Signed)
No recurrence.  Resolved.

## 2014-08-06 ENCOUNTER — Encounter (HOSPITAL_COMMUNITY): Payer: Self-pay | Admitting: Cardiovascular Disease

## 2014-08-21 ENCOUNTER — Other Ambulatory Visit: Payer: Self-pay | Admitting: Pulmonary Disease

## 2014-08-30 ENCOUNTER — Other Ambulatory Visit: Payer: Self-pay | Admitting: Pulmonary Disease

## 2014-09-28 ENCOUNTER — Other Ambulatory Visit: Payer: Self-pay | Admitting: Pulmonary Disease

## 2014-11-19 ENCOUNTER — Other Ambulatory Visit: Payer: Self-pay

## 2014-11-19 DIAGNOSIS — Z1231 Encounter for screening mammogram for malignant neoplasm of breast: Secondary | ICD-10-CM

## 2014-12-21 ENCOUNTER — Ambulatory Visit: Payer: Self-pay

## 2014-12-22 ENCOUNTER — Ambulatory Visit
Admission: RE | Admit: 2014-12-22 | Discharge: 2014-12-22 | Disposition: A | Discharge status: Home / Self Care | Payer: 59 | Source: Ambulatory Visit

## 2014-12-22 DIAGNOSIS — Z1231 Encounter for screening mammogram for malignant neoplasm of breast: Secondary | ICD-10-CM

## 2015-01-09 ENCOUNTER — Other Ambulatory Visit: Payer: Self-pay | Admitting: Pulmonary Disease

## 2015-01-11 NOTE — Telephone Encounter (Signed)
Per 11/15 office note Xarelto was refilled but was to be continued thru pcp [place] refilled in Feb 2016 Xarelto denied today.

## 2015-02-22 ENCOUNTER — Other Ambulatory Visit: Payer: Self-pay

## 2015-05-17 ENCOUNTER — Ambulatory Visit: Payer: 59 | Admitting: Oncology

## 2015-06-22 ENCOUNTER — Ambulatory Visit (INDEPENDENT_AMBULATORY_CARE_PROVIDER_SITE_OTHER): Payer: 59 | Admitting: Oncology

## 2015-06-22 ENCOUNTER — Encounter: Payer: Self-pay | Admitting: Oncology

## 2015-06-22 VITALS — BP 121/79 | HR 80 | Temp 98.6°F | Ht 64.0 in | Wt 189.5 lb

## 2015-06-22 DIAGNOSIS — I2782 Chronic pulmonary embolism: Secondary | ICD-10-CM | POA: Diagnosis not present

## 2015-06-22 DIAGNOSIS — I82592 Chronic embolism and thrombosis of other specified deep vein of left lower extremity: Secondary | ICD-10-CM | POA: Diagnosis not present

## 2015-06-22 DIAGNOSIS — Z7901 Long term (current) use of anticoagulants: Secondary | ICD-10-CM | POA: Diagnosis not present

## 2015-06-22 NOTE — Patient Instructions (Signed)
Return visit in 1 year

## 2015-06-22 NOTE — Progress Notes (Signed)
Patient ID: Cynthia Cameron, female   DOB: Nov 19, 1960, 54 y.o.   MRN: [id number] Hematology and Oncology Follow Up Visit  Cynthia Cameron [id number] 28-Dec-1960 54 y.o. 06/22/2015 3:51 PM   Principle Diagnosis: Encounter Diagnosis  Name Primary?  . Other chronic pulmonary embolism ([place]) Yes  Clinical Summary: 54 year old woman who presented in March 2014 with progressive dyspnea and pleuritic chest pain. She was found to have pulmonary emboli, low volume, right upper and right middle lobes with bilateral pleural effusions. Venous Doppler studies showed acute DVT in the left posterior tibial and peroneal veins. The pleural effusions were tapped. Cytology showed no malignant cells. She had no personal or family risk factors for thrombosis. A maternal aunt had lupus. The patient tested negative for ANA and anti-double-stranded DNA. She was found to have an elevation of rheumatoid factor. She had no clinical signs or symptoms of a collagen vascular disorder. She had no constitutional symptoms. She was up-to-date on all of her health maintenance examinations including mammograms, Pap smears, and pelvic exams. A hypercoagulation profile was unrevealing. Please see my initial office consultation dated 05/11/2014 for complete details. In view of the unprovoked nature of her thrombotic events, I recommended long-term full dose anticoagulation with annual re-evaluations to reassess risk versus benefits of continuing. She was started on Rivaroxaban which she continues at this time. She has hypertension and insulin-dependent diabetes. She is followed by Dr. Fleet Contras for the hypertension and Dr. Gareth Eagle for the diabetes. Dr. Servando Salina is her gynecologist.    Interim History:  She is doing well at this time. She denies any dyspnea, chest pain, palpitations, calf pain or swelling. No change in bowel habit. No vaginal bleeding.  Medications: reviewed  Allergies: No Known  Allergies  Review of Systems: See history of present illness Remaining ROS negative:   Physical Exam: Blood pressure 121/79, pulse 80, temperature 98.6 F (37 C), temperature source Oral, height 5\' 4"  (1.626 m), weight 189 lb 8 oz (85.957 kg), last menstrual period 09/16/2010, SpO2 100 %. Wt Readings from Last 3 Encounters:  06/22/15 189 lb 8 oz (85.957 kg)  07/16/14 183 lb (83.008 kg)  05/11/14 177 lb 6.4 oz (80.468 kg)     General appearance: Well-nourished African-American woman HENNT: Pharynx no erythema, exudate, mass, or ulcer. No thyromegaly or thyroid nodules Lymph nodes: No cervical, supraclavicular, or axillary lymphadenopathy Breasts:  Lungs: Clear to auscultation, resonant to percussion throughout Heart: Regular rhythm, no murmur, no gallop, no rub, no click, no edema Abdomen: Soft, nontender, normal bowel sounds, no mass, no organomegaly Extremities: No edema, no calf tenderness Musculoskeletal: no joint deformities Measurements: Weight 2 cm on the right, 43 on the left Ankle measurements: 25.5 cm on the right, 26 cm on the left.  GU:  Vascular: Carotid pulses 2+, no bruits, distal pulses: Dorsalis pedis 1+ symmetric Neurologic: Alert, oriented, PERRLA, , cranial nerves grossly normal, motor strength 5 over 5, reflexes 1+ symmetric, upper body coordination normal, gait normal, Skin: No rash or ecchymosis  Lab Results: CBC W/Diff    Component Value Date/Time   WBC 7.2 05/11/2014 1500   WBC 8.7 09/24/2013 1741   RBC 5.21* 05/11/2014 1500   RBC 5.21* 05/11/2014 1500   RBC 5.16 09/24/2013 1741   HGB 14.0 05/11/2014 1500   HGB 13.9 09/24/2013 1741   HCT 41.3 05/11/2014 1500   HCT 44.1 09/24/2013 1741   PLT 297 05/11/2014 1500   MCV 79.3 05/11/2014 1500   MCV 85.4 09/24/2013 1741  MCH 26.9 05/11/2014 1500   MCH 26.9* 09/24/2013 1741   MCHC 33.9 05/11/2014 1500   MCHC 31.5* 09/24/2013 1741   RDW 14.2 05/11/2014 1500   LYMPHSABS 1.7 05/11/2014 1500   MONOABS  0.4 05/11/2014 1500   EOSABS 0.2 05/11/2014 1500   BASOSABS 0.0 05/11/2014 1500     Chemistry      Component Value Date/Time   NA 142 09/24/2013 1736   K 3.1* 09/24/2013 1736   CL 102 09/24/2013 1736   CO2 30 09/24/2013 1736   BUN 15 09/24/2013 1736   CREATININE 1.48* 09/24/2013 1736   CREATININE 1.8* 05/20/2013 1525      Component Value Date/Time   CALCIUM 10.2 09/24/2013 1736   ALKPHOS 103 09/24/2013 1736   AST 17 09/24/2013 1736   ALT 16 09/24/2013 1736   BILITOT 0.4 09/24/2013 1736       Radiological Studies: Most recent chest x-ray 2014-08-04 showed complete resolution of previous effusions. No other pathology. Most recent mammogram done 12/22/2014 was normal.  Impression:  Unprovoked left lower extremity DVT and right-sided pulmonary emboli and a 54 year old hypertensive, diabetic, woman.  We again had a lengthy discussion about the value of continuing long-term anticoagulation in the face of unprovoked and potentially life-threatening thrombotic events. She understands and agrees that this is important and will continue on Rivaroxaban with annual reevaluation. I would like to see additional mature studies to support the single study in the literature that showed that prophylactic dose apixiban could be a good alternative for chronic maintenance therapy in people with prior thrombotic events who knew to be on long-term anticoagulation. For now she will stay on the full dose Rivaroxaban.   CC: Patient Care Team: Anda Kraft, MD as PCP - General (Endocrinology) Annia Belt, MD as Consulting Physician (Oncology) Servando Salina, MD as Consulting Physician (Obstetrics and Gynecology) Fleet Contras, MD as Consulting Physician (Nephrology)   Annia Belt, MD 10/25/20163:51 PM

## 2015-11-17 ENCOUNTER — Other Ambulatory Visit: Payer: Self-pay

## 2015-11-17 DIAGNOSIS — Z1231 Encounter for screening mammogram for malignant neoplasm of breast: Secondary | ICD-10-CM

## 2015-11-26 ENCOUNTER — Ambulatory Visit
Admission: RE | Admit: 2015-11-26 | Discharge: 2015-11-26 | Disposition: A | Discharge status: Home / Self Care | Payer: 59 | Source: Ambulatory Visit

## 2015-11-26 DIAGNOSIS — Z1231 Encounter for screening mammogram for malignant neoplasm of breast: Secondary | ICD-10-CM

## 2015-11-30 ENCOUNTER — Other Ambulatory Visit: Payer: Self-pay | Admitting: Obstetrics and Gynecology

## 2015-11-30 DIAGNOSIS — R928 Other abnormal and inconclusive findings on diagnostic imaging of breast: Secondary | ICD-10-CM

## 2015-12-06 ENCOUNTER — Other Ambulatory Visit: Payer: Self-pay | Admitting: Endocrinology

## 2015-12-06 DIAGNOSIS — R928 Other abnormal and inconclusive findings on diagnostic imaging of breast: Secondary | ICD-10-CM

## 2015-12-09 ENCOUNTER — Ambulatory Visit
Admission: RE | Admit: 2015-12-09 | Discharge: 2015-12-09 | Disposition: A | Discharge status: Home / Self Care | Payer: 59 | Source: Ambulatory Visit | Attending: Obstetrics and Gynecology | Admitting: Obstetrics and Gynecology

## 2015-12-09 DIAGNOSIS — R928 Other abnormal and inconclusive findings on diagnostic imaging of breast: Secondary | ICD-10-CM

## 2016-05-09 ENCOUNTER — Encounter (HOSPITAL_COMMUNITY): Payer: Self-pay | Admitting: Emergency Medicine

## 2016-05-09 ENCOUNTER — Ambulatory Visit (HOSPITAL_COMMUNITY)
Admission: EM | Admit: 2016-05-09 | Discharge: 2016-05-09 | Disposition: A | Discharge status: Home / Self Care | Payer: 59 | Attending: Family Medicine | Admitting: Family Medicine

## 2016-05-09 DIAGNOSIS — M7662 Achilles tendinitis, left leg: Secondary | ICD-10-CM | POA: Diagnosis not present

## 2016-05-09 MED ORDER — DICLOFENAC POTASSIUM 50 MG PO TABS: 50.0000 mg | ORAL_TABLET | Freq: Three times a day (TID) | ORAL | 0 refills | Status: DC

## 2016-05-09 NOTE — Discharge Instructions (Signed)
Ice, medicine, brace as needed, see ortho if further problems.

## 2016-05-09 NOTE — ED Provider Notes (Signed)
[facility]    CSN: [id number] Arrival date & time: 05/09/16  1018  First Provider Contact:  First MD Initiated Contact with Patient 05/09/16 1100        History   Chief Complaint Chief Complaint  Patient presents with  . Foot Pain    HPI Cynthia Cameron is a 55 y.o. female.   The history is provided by the patient.  Foot Pain  This is a new problem. The current episode started more than 1 week ago (h/o heel spurs., does a lot of walking otj.). The problem occurs constantly. The problem has been gradually worsening. The symptoms are aggravated by walking.    Past Medical History:  Diagnosis Date  . Allergy   . Bipolar depression ([place])   . Chest pain    "since 09/2012; hurts all the time" (12/24/2012)  . Depression   . DVT, lower extremity ([place]) 10/2012   "2" (12/24/2012)  . High cholesterol   . Hypertension   . Iron deficiency anemia   . Pneumonia 09/2012  . Pulmonary embolism on right ([place]) 09/2012  . Shortness of breath   . Type II diabetes mellitus [facility])     Patient Active Problem List   Diagnosis Date Noted  . Pleuritic chest pain 05/22/2013  . Pulmonary embolism ([place]) 11/07/2012  . DVT (deep venous thrombosis) ([place]) 11/07/2012  . Pericardial effusion 11/06/2012  . Pleural effusion 11/05/2012  . Diabetes mellitus without complication ([place])   . Hypertension   . Depression     Past Surgical History:  Procedure Laterality Date  . CARDIAC CATHETERIZATION  09/2012  . CESAREAN SECTION  1994; 1996  . LEFT HEART CATHETERIZATION WITH CORONARY ANGIOGRAM N/A 10/25/2012   Procedure: LEFT HEART CATHETERIZATION WITH CORONARY ANGIOGRAM;  Surgeon: Birdie Riddle, MD;  Location: [place] CATH LAB;  Service: Cardiovascular;  Laterality: N/A;    OB History    No data available       Home Medications    Prior to Admission medications   Medication Sig Start Date End Date Taking? Authorizing Provider  amLODipine (NORVASC) 5 MG tablet TAKE 1 TABLET BY MOUTH  EVERY DAY 01/02/13   Gay Filler Copland, MD  buPROPion (WELLBUTRIN) 100 MG tablet Take 50 mg by mouth daily.     Historical Provider, MD  cloNIDine (CATAPRES) 0.2 MG tablet Take 0.2 mg by mouth at bedtime.    Historical Provider, MD  clotrimazole-betamethasone (LOTRISONE) cream Apply 1 application topically 2 (two) times daily. 11/23/13   Leandrew Koyanagi, MD  cyclobenzaprine (FLEXERIL) 10 MG tablet Take 1 tablet (10 mg total) by mouth 3 (three) times daily as needed for muscle spasms. 09/07/13   Shawnee Knapp, MD  furosemide (LASIX) 40 MG tablet Take 1 tablet (40 mg total) by mouth daily. 11/28/12   Dixie Dials, MD  glucose blood (FREESTYLE LITE) test strip To check blood sugars tid dx code 250.00 12/30/12   Gay Filler Copland, MD  insulin aspart (NOVOLOG) 100 UNIT/ML injection Inject 0-15 Units into the skin 3 (three) times daily with meals. Patient taking differently: Inject 0-15 Units into the skin as needed.  12/25/12   Dixie Dials, MD  insulin glargine (LANTUS) 100 UNIT/ML injection Inject 0.2 mLs (20 Units total) into the skin at bedtime. Patient taking differently: Inject 10 Units into the skin at bedtime.  12/25/12   Dixie Dials, MD  Insulin Syringes, Disposable, U-100 0.5 ML MISC 3-20 Units by Does not apply route 4 (four) times daily -  before meals and at bedtime. 12/25/12   Dixie Dials, MD  KLOR-CON 10 10 MEQ tablet Take 2 tablets by mouth 2 (two) times daily. 05/25/13   Historical Provider, MD  Liraglutide (VICTOZA [place]) Inject 1.2 Units into the skin daily.     Historical Provider, MD  lisinopril (PRINIVIL,ZESTRIL) 20 MG tablet Take 1 tablet (20 mg total) by mouth daily. PATIENT NEEDS OFFICE VISIT FOR ADDITIONAL REFILLS 06/12/13   Darreld Mclean, MD  Multiple Vitamin (MULTIVITAMIN WITH MINERALS) TABS Take 1 tablet by mouth daily.    Historical Provider, MD  nebivolol (BYSTOLIC) 10 MG tablet Take 10 mg by mouth daily.    Historical Provider, MD  OLANZapine (ZYPREXA) 5 MG tablet Take 5 mg by  mouth at bedtime.    Historical Provider, MD  oxyCODONE-acetaminophen (PERCOCET/ROXICET) 5-325 MG per tablet Take 1 tablet by mouth as needed. 09/07/13   Shawnee Knapp, MD  pravastatin (PRAVACHOL) 40 MG tablet TAKE 1 TABLET (40 MG TOTAL) BY MOUTH DAILY. 12/25/12   Heather M Marte, PA-C  XARELTO 20 MG TABS tablet TAKE 1 TABLET BY MOUTH ONCE DAILY WITH SUPPER. 10/01/14   Chesley Mires, MD    Family History Family History  Problem Relation Age of Onset  . Diabetes Mother   . Atrial fibrillation Mother   . Hypertension Father     Social History Social History  Substance Use Topics  . Smoking status: Never Smoker  . Smokeless tobacco: Never Used  . Alcohol use 0.0 oz/week     Comment: Rarely.     Allergies   Review of patient's allergies indicates no known allergies.   Review of Systems Review of Systems  Musculoskeletal: Positive for gait problem. Negative for joint swelling.  Skin: Negative.      Physical Exam Triage Vital Signs ED Triage Vitals  Enc Vitals Group     BP 05/09/16 1049 127/87     Pulse Rate 05/09/16 1049 72     Resp 05/09/16 1049 16     Temp 05/09/16 1049 98.1 F (36.7 C)     Temp Source 05/09/16 1049 Oral     SpO2 05/09/16 1049 100 %     Weight --      Height --      Head Circumference --      Peak Flow --      Pain Score 05/09/16 1051 9     Pain Loc --      Pain Edu? --      Excl. in [place]? --    No data found.   Updated Vital Signs BP 127/87 (BP Location: Left Arm)   Pulse 72   Temp 98.1 F (36.7 C) (Oral)   Resp 16   LMP 09/16/2010   SpO2 100%   Visual Acuity Right Eye Distance:   Left Eye Distance:   Bilateral Distance:    Right Eye Near:   Left Eye Near:    Bilateral Near:     Physical Exam  Constitutional: She is oriented to person, place, and time. She appears well-developed and well-nourished.  Musculoskeletal: Normal range of motion. She exhibits tenderness. She exhibits no deformity.       Left foot: There is swelling.        Feet:  Neurological: She is alert and oriented to person, place, and time.  Nursing note and vitals reviewed.    UC Treatments / Results  Labs (all labs ordered are listed, but only abnormal results are displayed) Labs Reviewed -  No data to display  EKG  EKG Interpretation None       Radiology No results found.  Procedures Procedures (including critical care time)  Medications Ordered in UC Medications - No data to display   Initial Impression / Assessment and Plan / UC Course  I have reviewed the triage vital signs and the nursing notes.  Pertinent labs & imaging results that were available during my care of the patient were reviewed by me and considered in my medical decision making (see chart for details).  Clinical Course      Final Clinical Impressions(s) / UC Diagnoses   Final diagnoses:  None    New Prescriptions New Prescriptions   No medications on file     Billy Fischer, MD 05/09/16 1116

## 2016-05-09 NOTE — ED Triage Notes (Signed)
Pain in left heel for one week.  Patient reports no injury.  Patient reports she has had heel spurs in the past that felt the same.  Pain is along the back of the heel.  Patient has large amount of swelling in lower extremities, but says this is normal for her.    Patient has used aleve for pain with little relief.  No one with patient today.

## 2016-06-21 ENCOUNTER — Ambulatory Visit (INDEPENDENT_AMBULATORY_CARE_PROVIDER_SITE_OTHER): Payer: 59 | Admitting: Pulmonary Disease

## 2016-06-21 DIAGNOSIS — I2782 Chronic pulmonary embolism: Secondary | ICD-10-CM

## 2016-06-21 NOTE — Progress Notes (Signed)
Patient not seen today. Patient is followed by Dr Beryle Beams yearly and is scheduled for an appt 06/2016 with him. Pt's appt cancelled for today.   Virl Cagey, CMA

## 2016-06-26 ENCOUNTER — Ambulatory Visit (INDEPENDENT_AMBULATORY_CARE_PROVIDER_SITE_OTHER): Payer: 59 | Admitting: Oncology

## 2016-06-26 ENCOUNTER — Encounter: Payer: Self-pay | Admitting: Oncology

## 2016-06-26 VITALS — BP 111/78 | HR 68 | Temp 98.0°F | Ht 64.0 in | Wt 183.1 lb

## 2016-06-26 DIAGNOSIS — I10 Essential (primary) hypertension: Secondary | ICD-10-CM | POA: Diagnosis not present

## 2016-06-26 DIAGNOSIS — E119 Type 2 diabetes mellitus without complications: Secondary | ICD-10-CM | POA: Diagnosis not present

## 2016-06-26 DIAGNOSIS — I82592 Chronic embolism and thrombosis of other specified deep vein of left lower extremity: Secondary | ICD-10-CM | POA: Diagnosis not present

## 2016-06-26 DIAGNOSIS — Z7901 Long term (current) use of anticoagulants: Secondary | ICD-10-CM

## 2016-06-26 DIAGNOSIS — Z79899 Other long term (current) drug therapy: Secondary | ICD-10-CM

## 2016-06-26 DIAGNOSIS — F418 Other specified anxiety disorders: Secondary | ICD-10-CM

## 2016-06-26 DIAGNOSIS — Z794 Long term (current) use of insulin: Secondary | ICD-10-CM

## 2016-06-26 DIAGNOSIS — I2782 Chronic pulmonary embolism: Secondary | ICD-10-CM | POA: Diagnosis not present

## 2016-06-26 MED ORDER — CLOTRIMAZOLE-BETAMETHASONE 1-0.05 % EX CREA: 1.0000 "application " | TOPICAL_CREAM | Freq: Two times a day (BID) | CUTANEOUS | 1 refills | Status: DC

## 2016-06-26 NOTE — Progress Notes (Signed)
Hematology and Oncology Follow Up Visit  KATARYNA KIN [id number] Mar 18, 1961 55 y.o. 06/26/2016 5:50 PM   Principle Diagnosis: Encounter Diagnoses  Name Primary?  . Other chronic pulmonary embolism without acute cor pulmonale (HCC) Yes  . Chronic deep vein thrombosis (DVT) of other vein of left lower extremity [facility])   Clinical summary: 55 year old postmenopausal woman who presented in March 2014 with progressive dyspnea and pleuritic chest pain. She was found to have pulmonary emboli, low volume, right upper and right middle lobes with bilateral pleural effusions. Venous Doppler studies showed acute DVT in the left posterior tibial and peroneal veins. The pleural effusions were tapped. Cytology showed no malignant cells. She had no personal or family risk factors for thrombosis. A maternal aunt had lupus. The patient tested negative for ANA and anti-double-stranded DNA. She was found to have an elevation of rheumatoid factor. She had no clinical signs or symptoms of a collagen vascular disorder. She had no constitutional symptoms. She was up-to-date on all of her health maintenance examinations including mammograms, Pap smears, and pelvic exams. A hypercoagulation profile was unrevealing. Please see my initial office consultation dated 05/11/2014 for complete details. In view of the unprovoked nature of her thrombotic events, I recommended long-term full dose anticoagulation with annual re-evaluations to reassess risk versus benefits of continuing. She was started on Rivaroxaban which she continues at this time. She has hypertension and insulin-dependent diabetes. She is followed by Dr. Fleet Contras for the hypertension and Dr. Gareth Eagle for the diabetes. Dr. Servando Salina is her gynecologist.    Interim History:   She is doing well. She has had no interim medical problems. Blood pressure now under excellent control. She is going to sign up for a physician supervised weight  loss program. She is determined to get on less medications and get off insulin if possible. She had a single episode of diffuse bilateral pectoral aching pain nonradiating occurring at rest not associated with dyspnea or palpitations which started in the evening after she picked up her infant granddaughter. The pain persisted all might but was gone by the next morning. It has not recurred. She has not had any pain or swelling of her legs. 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 Her 3 sons are all grown and out of the house. Her chronic anxiety/depression is well controlled on low-dose Zyprexa and Wellbutrin. She really needs to see her psychiatrist anymore. Most recent mammogram on April 13 with follow-up ultrasound showed a likely sebaceous cyst, partially decompressed. No worrisome findings for malignancy.  Medications: reviewed  Allergies: No Known Allergies  Review of Systems: See interim history Remaining ROS negative:   Physical Exam: Blood pressure 111/78, pulse 68, temperature 98 F (36.7 C), temperature source Oral, height 5\' 4"  (1.626 m), weight 183 lb 1.6 oz (83.1 kg), last menstrual period 09/16/2010, SpO2 100 %. Wt Readings from Last 3 Encounters:  06/26/16 183 lb 1.6 oz (83.1 kg)  06/22/15 189 lb 8 oz (86 kg)  07/16/14 183 lb (83 kg)     General appearance: Well-nourished African-American woman HENNT: Pharynx no erythema, exudate, mass, or ulcer. No thyromegaly or thyroid nodules Lymph nodes: No cervical, supraclavicular, or axillary lymphadenopathy Breasts:  Lungs: Clear to auscultation, resonant to percussion throughout Heart: Regular rhythm, no murmur, no gallop, no rub, no click, no edema Abdomen: Soft, nontender, normal bowel sounds, no mass, no organomegaly Extremities: No edema, no calf tenderness Musculoskeletal: no joint deformities Left calf 42 cm,  right 41 cm GU:  Vascular: Carotid pulses 2+, no  bruits, Neurologic: Alert, oriented, PERRLA, optic discs sharp and vessels normal, no hemorrhage or exudate, cranial nerves grossly normal, motor strength 5 over 5, reflexes 1+ symmetric, upper body coordination normal, gait normal, Skin: No rash or ecchymosis  Lab Results: CBC W/Diff    Component Value Date/Time   WBC 7.2 05/11/2014 1500   RBC 5.21 (H) 05/11/2014 1500   RBC 5.21 (H) 05/11/2014 1500   HGB 14.0 05/11/2014 1500   HCT 41.3 05/11/2014 1500   PLT 297 05/11/2014 1500   MCV 79.3 05/11/2014 1500   MCV 85.4 09/24/2013 1741   MCH 26.9 05/11/2014 1500   MCHC 33.9 05/11/2014 1500   RDW 14.2 05/11/2014 1500   LYMPHSABS 1.7 05/11/2014 1500   MONOABS 0.4 05/11/2014 1500   EOSABS 0.2 05/11/2014 1500   BASOSABS 0.0 05/11/2014 1500     Chemistry      Component Value Date/Time   NA 142 09/24/2013 1736   K 3.1 (L) 09/24/2013 1736   CL 102 09/24/2013 1736   CO2 30 09/24/2013 1736   BUN 15 09/24/2013 1736   CREATININE 1.48 (H) 09/24/2013 1736      Component Value Date/Time   CALCIUM 10.2 09/24/2013 1736   ALKPHOS 103 09/24/2013 1736   AST 17 09/24/2013 1736   ALT 16 09/24/2013 1736   BILITOT 0.4 09/24/2013 1736       Radiological Studies: No results found.  Impression:  #1. Unprovoked left lower extremity DVT and right-sided pulmonary emboli. She is now about 3-1/2 years from the event and doing well on Xarelto. I don't know what to make of her isolated episode of atypical chest discomfort. She is advised to call if symptoms recur. I do not see any immediate need for x-ray imaging. Since her visit with me last year, there is now a second study this time with Xarelto randomizing patients to aspirin, full dose, or half dose Xarelto afternoon initial 6-12 months of treatment. Patients followed for an additional year. At least out to two years of follow-up, the reduced dose Xarelto gave equivalent protection against recurrent thrombosis. There were not as many patients on  this study who had unprovoked clots. For the time being, I'm going to keep her on the full dose of the Xarelto. She is okay with this.  #2. Essential hypertension Well controlled on current regimen  #3. Type II, insulin-dependent diabetes Well controlled on current regimen.     CC: Patient Care Team: Anda Kraft, MD as PCP - General (Endocrinology) Annia Belt, MD as Consulting Physician (Oncology) Servando Salina, MD as Consulting Physician (Obstetrics and Gynecology) Fleet Contras, MD as Consulting Physician (Nephrology) Chesley Mires, MD as Consulting Physician (Pulmonary Disease) Purnell Shoemaker., MD as Attending Physician (Psychiatry)   Annia Belt, MD 10/30/20175:50 PM

## 2016-06-26 NOTE — Patient Instructions (Signed)
To lab today Return visit 1 year  

## 2016-06-27 ENCOUNTER — Telehealth: Payer: Self-pay | Admitting: *Deleted

## 2016-06-27 LAB — CBC WITH DIFFERENTIAL/PLATELET
Basophils Absolute: 0 10*3/uL (ref 0.0–0.2)
Basos: 0 %
EOS (ABSOLUTE): 0.2 10*3/uL (ref 0.0–0.4)
Eos: 3 %
Hematocrit: 38.3 % (ref 34.0–46.6)
Hemoglobin: 12.8 g/dL (ref 11.1–15.9)
IMMATURE GRANULOCYTES: 0 %
Immature Grans (Abs): 0 10*3/uL (ref 0.0–0.1)
Lymphocytes Absolute: 1.7 10*3/uL (ref 0.7–3.1)
Lymphs: 26 %
MCH: 27.8 pg (ref 26.6–33.0)
MCHC: 33.4 g/dL (ref 31.5–35.7)
MCV: 83 fL (ref 79–97)
MONOS ABS: 0.5 10*3/uL (ref 0.1–0.9)
Monocytes: 7 %
Neutrophils Absolute: 4.1 10*3/uL (ref 1.4–7.0)
Neutrophils: 64 %
PLATELETS: 298 10*3/uL (ref 150–379)
RBC: 4.6 x10E6/uL (ref 3.77–5.28)
RDW: 14.9 % (ref 12.3–15.4)
WBC: 6.4 10*3/uL (ref 3.4–10.8)

## 2016-06-27 NOTE — Telephone Encounter (Signed)
Pt called - no answer; left message "blood count OK. Hemoglobin 12.8. Was 14 2 years ago.She may want to take an iron supplement once a day. "  And to call for any questions.

## 2016-06-27 NOTE — Telephone Encounter (Signed)
-----   Message from Annia Belt, MD sent at 06/27/2016  9:35 AM EDT ----- Call pt: blood count OK. Hemoglobin 12.8. Was 14 2 years ago.She may want to take an iron supplement once a day.

## 2016-06-28 ENCOUNTER — Telehealth: Payer: Self-pay | Admitting: Oncology

## 2016-06-28 NOTE — Telephone Encounter (Signed)
Asking for refill for sulfamethoxazole [facility] [address]

## 2016-06-28 NOTE — Telephone Encounter (Signed)
She should not be taking NSAIDs on a regular basis since she is on a blood thinner - bleeding risk increased. She can take tylenol or ES tylenol.

## 2016-06-28 NOTE — Telephone Encounter (Signed)
Call pt - pt requesting a refill on Diclofenac 50 mg for tendinitis; said prescribed by Dr Juventino Slovak when she went to the ER. Trying to avoid another back to the ER. States pain left ankle w/ prolong standing/walking. [facility].

## 2016-06-29 NOTE — Telephone Encounter (Signed)
Pt called - no one answered; left message "She should not be taking NSAIDs on a regular basis since she is on a blood thinner - bleeding risk increased. She can take tylenol or ES tylenol." per Dr Beryle Beams. And to call back for any questions.

## 2016-09-07 DIAGNOSIS — E1129 Type 2 diabetes mellitus with other diabetic kidney complication: Secondary | ICD-10-CM | POA: Diagnosis not present

## 2016-09-07 DIAGNOSIS — I129 Hypertensive chronic kidney disease with stage 1 through stage 4 chronic kidney disease, or unspecified chronic kidney disease: Secondary | ICD-10-CM | POA: Diagnosis not present

## 2016-09-07 DIAGNOSIS — N183 Chronic kidney disease, stage 3 (moderate): Secondary | ICD-10-CM | POA: Diagnosis not present

## 2016-09-12 DIAGNOSIS — E118 Type 2 diabetes mellitus with unspecified complications: Secondary | ICD-10-CM | POA: Diagnosis not present

## 2016-09-12 DIAGNOSIS — I10 Essential (primary) hypertension: Secondary | ICD-10-CM | POA: Diagnosis not present

## 2016-09-12 DIAGNOSIS — I2699 Other pulmonary embolism without acute cor pulmonale: Secondary | ICD-10-CM | POA: Diagnosis not present

## 2016-09-21 DIAGNOSIS — N183 Chronic kidney disease, stage 3 (moderate): Secondary | ICD-10-CM | POA: Diagnosis not present

## 2016-10-27 DIAGNOSIS — N184 Chronic kidney disease, stage 4 (severe): Secondary | ICD-10-CM | POA: Diagnosis not present

## 2016-11-14 ENCOUNTER — Other Ambulatory Visit: Payer: Self-pay | Admitting: Obstetrics and Gynecology

## 2016-11-14 DIAGNOSIS — Z1231 Encounter for screening mammogram for malignant neoplasm of breast: Secondary | ICD-10-CM

## 2016-12-12 ENCOUNTER — Ambulatory Visit: Payer: 59

## 2017-01-02 ENCOUNTER — Ambulatory Visit
Admission: RE | Admit: 2017-01-02 | Discharge: 2017-01-02 | Disposition: A | Discharge status: Home / Self Care | Payer: 59 | Source: Ambulatory Visit | Attending: Obstetrics and Gynecology | Admitting: Obstetrics and Gynecology

## 2017-01-02 DIAGNOSIS — Z1231 Encounter for screening mammogram for malignant neoplasm of breast: Secondary | ICD-10-CM

## 2017-01-18 DIAGNOSIS — E1129 Type 2 diabetes mellitus with other diabetic kidney complication: Secondary | ICD-10-CM | POA: Diagnosis not present

## 2017-01-18 DIAGNOSIS — I129 Hypertensive chronic kidney disease with stage 1 through stage 4 chronic kidney disease, or unspecified chronic kidney disease: Secondary | ICD-10-CM | POA: Diagnosis not present

## 2017-01-18 DIAGNOSIS — N183 Chronic kidney disease, stage 3 (moderate): Secondary | ICD-10-CM | POA: Diagnosis not present

## 2017-02-08 DIAGNOSIS — Z01419 Encounter for gynecological examination (general) (routine) without abnormal findings: Secondary | ICD-10-CM | POA: Diagnosis not present

## 2017-02-21 DIAGNOSIS — I10 Essential (primary) hypertension: Secondary | ICD-10-CM | POA: Diagnosis not present

## 2017-02-21 DIAGNOSIS — E118 Type 2 diabetes mellitus with unspecified complications: Secondary | ICD-10-CM | POA: Diagnosis not present

## 2017-02-21 DIAGNOSIS — L309 Dermatitis, unspecified: Secondary | ICD-10-CM | POA: Diagnosis not present

## 2017-02-22 DIAGNOSIS — E109 Type 1 diabetes mellitus without complications: Secondary | ICD-10-CM | POA: Diagnosis not present

## 2017-02-26 DIAGNOSIS — E118 Type 2 diabetes mellitus with unspecified complications: Secondary | ICD-10-CM | POA: Diagnosis not present

## 2017-02-26 DIAGNOSIS — E789 Disorder of lipoprotein metabolism, unspecified: Secondary | ICD-10-CM | POA: Diagnosis not present

## 2017-03-02 DIAGNOSIS — D485 Neoplasm of uncertain behavior of skin: Secondary | ICD-10-CM | POA: Diagnosis not present

## 2017-03-02 DIAGNOSIS — L739 Follicular disorder, unspecified: Secondary | ICD-10-CM | POA: Diagnosis not present

## 2017-03-02 DIAGNOSIS — L439 Lichen planus, unspecified: Secondary | ICD-10-CM | POA: Diagnosis not present

## 2017-03-02 DIAGNOSIS — L821 Other seborrheic keratosis: Secondary | ICD-10-CM | POA: Diagnosis not present

## 2017-04-24 DIAGNOSIS — E118 Type 2 diabetes mellitus with unspecified complications: Secondary | ICD-10-CM | POA: Diagnosis not present

## 2017-04-24 DIAGNOSIS — E789 Disorder of lipoprotein metabolism, unspecified: Secondary | ICD-10-CM | POA: Diagnosis not present

## 2017-05-03 DIAGNOSIS — L218 Other seborrheic dermatitis: Secondary | ICD-10-CM | POA: Diagnosis not present

## 2017-05-03 DIAGNOSIS — L439 Lichen planus, unspecified: Secondary | ICD-10-CM | POA: Diagnosis not present

## 2017-05-24 DIAGNOSIS — I129 Hypertensive chronic kidney disease with stage 1 through stage 4 chronic kidney disease, or unspecified chronic kidney disease: Secondary | ICD-10-CM | POA: Diagnosis not present

## 2017-05-24 DIAGNOSIS — M25562 Pain in left knee: Secondary | ICD-10-CM | POA: Diagnosis not present

## 2017-05-24 DIAGNOSIS — E1129 Type 2 diabetes mellitus with other diabetic kidney complication: Secondary | ICD-10-CM | POA: Diagnosis not present

## 2017-05-24 DIAGNOSIS — N183 Chronic kidney disease, stage 3 (moderate): Secondary | ICD-10-CM | POA: Diagnosis not present

## 2017-06-15 ENCOUNTER — Ambulatory Visit (HOSPITAL_COMMUNITY)
Admission: EM | Admit: 2017-06-15 | Discharge: 2017-06-15 | Disposition: A | Discharge status: Home / Self Care | Payer: 59 | Attending: Emergency Medicine | Admitting: Emergency Medicine

## 2017-06-15 ENCOUNTER — Encounter (HOSPITAL_COMMUNITY): Payer: Self-pay | Admitting: Family Medicine

## 2017-06-15 DIAGNOSIS — M79671 Pain in right foot: Secondary | ICD-10-CM

## 2017-06-15 DIAGNOSIS — M722 Plantar fascial fibromatosis: Secondary | ICD-10-CM

## 2017-06-15 MED ORDER — KETOROLAC TROMETHAMINE 30 MG/ML IJ SOLN
INTRAMUSCULAR | Status: AC
  Filled 2017-06-15: qty 1

## 2017-06-15 MED ORDER — KETOROLAC TROMETHAMINE 30 MG/ML IJ SOLN
30.0000 mg | Freq: Once | INTRAMUSCULAR | Status: AC
  Administered 2017-06-15: 30 mg via INTRAMUSCULAR

## 2017-06-15 NOTE — ED Provider Notes (Signed)
[place]    CSN: [id number] Arrival date & time: 06/15/17  1801     History   Chief Complaint Chief Complaint  Patient presents with  . Foot Pain    HPI Cynthia Cameron is a 56 y.o. female.   Pt is here for pain to rt foot near heal. States that she has had this in the past and they gave her a boot for it, but she did not wear it. No injury, no puncture marks, foot warm to touch. 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 Has not taken anything pta.       Past Medical History:  Diagnosis Date  . Allergy   . Bipolar depression ([place])   . Chest pain    "since 09/2012; hurts all the time" (12/24/2012)  . Depression   . DVT, lower extremity ([place]) 10/2012   "2" (12/24/2012)  . High cholesterol   . Hypertension   . Iron deficiency anemia   . Pneumonia 09/2012  . Pulmonary embolism on right ([place]) 09/2012  . Shortness of breath   . Type II diabetes mellitus [facility])     Patient Active Problem List   Diagnosis Date Noted  . Pleuritic chest pain 05/22/2013  . Pulmonary embolism ([place]) 11/07/2012  . DVT (deep venous thrombosis) ([place]) 11/07/2012  . Pericardial effusion 11/06/2012  . Pleural effusion 11/05/2012  . Diabetes mellitus without complication ([place])   . Hypertension   . Depression     Past Surgical History:  Procedure Laterality Date  . CARDIAC CATHETERIZATION  09/2012  . CESAREAN SECTION  1994; 1996  . LEFT HEART CATHETERIZATION WITH CORONARY ANGIOGRAM N/A 10/25/2012   Procedure: LEFT HEART CATHETERIZATION WITH CORONARY ANGIOGRAM;  Surgeon: Birdie Riddle, MD;  Location: [place] CATH LAB;  Service: Cardiovascular;  Laterality: N/A;    OB History    No data available       Home Medications    Prior to Admission medications   Medication Sig Start Date End Date Taking? Authorizing Provider  amLODipine (NORVASC) 5 MG tablet TAKE 1 TABLET BY MOUTH EVERY DAY 01/02/13   Copland, Gay Filler, MD  buPROPion (WELLBUTRIN) 100 MG tablet Take 50 mg by  mouth daily.     [provider]  cloNIDine (CATAPRES) 0.2 MG tablet Take 0.2 mg by mouth at bedtime.    [provider]  clotrimazole-betamethasone (LOTRISONE) cream Apply 1 application topically 2 (two) times daily. 06/26/16   Annia Belt, MD  cyclobenzaprine (FLEXERIL) 10 MG tablet Take 1 tablet (10 mg total) by mouth 3 (three) times daily as needed for muscle spasms. 09/07/13   Shawnee Knapp, MD  diclofenac (CATAFLAM) 50 MG tablet Take 1 tablet (50 mg total) by mouth 3 (three) times daily. 05/09/16   Billy Fischer, MD  furosemide (LASIX) 40 MG tablet Take 1 tablet (40 mg total) by mouth daily. 11/28/12   Dixie Dials, MD  glucose blood (FREESTYLE LITE) test strip To check blood sugars tid dx code 250.00 12/30/12   Copland, Gay Filler, MD  insulin aspart (NOVOLOG) 100 UNIT/ML injection Inject 0-15 Units into the skin 3 (three) times daily with meals. Patient taking differently: Inject 0-15 Units into the skin as needed.  12/25/12   Dixie Dials, MD  insulin glargine (LANTUS) 100 UNIT/ML injection Inject 0.2 mLs (20 Units total) into the skin at bedtime. Patient taking differently: Inject 10 Units into the skin at bedtime.  12/25/12   Dixie Dials,  MD  Insulin Syringes, Disposable, U-100 0.5 ML MISC 3-20 Units by Does not apply route 4 (four) times daily -  before meals and at bedtime. 12/25/12   Dixie Dials, MD  KLOR-CON 10 10 MEQ tablet Take 2 tablets by mouth 2 (two) times daily. 05/25/13   [provider]  Liraglutide (VICTOZA [place]) Inject 1.2 Units into the skin daily.     [provider]  lisinopril (PRINIVIL,ZESTRIL) 20 MG tablet Take 1 tablet (20 mg total) by mouth daily. PATIENT NEEDS OFFICE VISIT FOR ADDITIONAL REFILLS 06/12/13   Copland, Gay Filler, MD  Multiple Vitamin (MULTIVITAMIN WITH MINERALS) TABS Take 1 tablet by mouth daily.    [provider]  nebivolol (BYSTOLIC) 10 MG tablet Take 10 mg by mouth daily.    [provider]    OLANZapine (ZYPREXA) 5 MG tablet Take 5 mg by mouth at bedtime.    [provider]  oxyCODONE-acetaminophen (PERCOCET/ROXICET) 5-325 MG per tablet Take 1 tablet by mouth as needed. 09/07/13   Shawnee Knapp, MD  pravastatin (PRAVACHOL) 40 MG tablet TAKE 1 TABLET (40 MG TOTAL) BY MOUTH DAILY. 12/25/12   Marte, Heather M, PA-C  XARELTO 20 MG TABS tablet TAKE 1 TABLET BY MOUTH ONCE DAILY WITH SUPPER. 10/01/14   Chesley Mires, MD    Family History Family History  Problem Relation Age of Onset  . Diabetes Mother   . Atrial fibrillation Mother   . Hypertension Father   . Breast cancer Neg Hx     Social History Social History  Substance Use Topics  . Smoking status: Never Smoker  . Smokeless tobacco: Never Used  . Alcohol use 0.0 oz/week     Comment: Rarely.     Allergies   Patient has no known allergies.   Review of Systems Review of Systems  Constitutional: Negative.   Respiratory: Negative.   Cardiovascular: Negative.   Musculoskeletal:       Rt foot pain   Skin: Negative.   Neurological: Negative.      Physical Exam Triage Vital Signs ED Triage Vitals  Enc Vitals Group     BP 06/15/17 1824 125/78     Pulse Rate 06/15/17 1824 69     Resp 06/15/17 1824 18     Temp 06/15/17 1824 97.9 F (36.6 C)     Temp src --      SpO2 06/15/17 1824 100 %     Weight --      Height --      Head Circumference --      Peak Flow --      Pain Score 06/15/17 1824 9     Pain Loc --      Pain Edu? --      Excl. in [place]? --    No data found.   Updated Vital Signs BP 125/78   Pulse 69   Temp 97.9 F (36.6 C)   Resp 18   LMP 09/16/2010   SpO2 100%   Visual Acuity Right Eye Distance:   Left Eye Distance:   Bilateral Distance:    Right Eye Near:   Left Eye Near:    Bilateral Near:     Physical Exam  Constitutional: She appears well-developed.  Cardiovascular: Normal rate.   Pulmonary/Chest: Breath sounds normal.  Musculoskeletal: She exhibits tenderness.  Pain  posterior area of rt foot, full rom, able to extend foot, no calf pain, -homans, strong pulses   Neurological: She is alert.  Skin:  Skin is warm. Capillary refill takes less than 2 seconds.     UC Treatments / Results  Labs (all labs ordered are listed, but only abnormal results are displayed) Labs Reviewed - No data to display  EKG  EKG Interpretation None       Radiology No results found.  Procedures Procedures (including critical care time)  Medications Ordered in UC Medications  ketorolac (TORADOL) 30 MG/ML injection 30 mg (not administered)     Initial Impression / Assessment and Plan / UC Course  I have reviewed the triage vital signs and the nursing notes.  Pertinent labs & imaging results that were available during my care of the patient were reviewed by me and considered in my medical decision making (see chart for details).     You may take pain meds as needed  You mentioned you have a boot at home already wear this for the next few days to see if this helps. Follow up with podiatry and pcp  Not able to give pain meds or nsaids due to medications pt is currently taking   Final Clinical Impressions(s) / UC Diagnoses   Final diagnoses:  Plantar fasciitis of right foot  Foot pain, right    New Prescriptions New Prescriptions   No medications on file     Controlled Substance Prescriptions [place] Controlled Substance Registry consulted? Not Applicable   Marney Setting, NP 06/15/17 1910

## 2017-06-15 NOTE — Discharge Instructions (Signed)
You may take pain meds as needed  You mentioned you have a boot at home already wear this for the next few days to see if this helps. Follow up with podiatry and pcp  Look at feet frequently since you are a diabetic  The medication that you take can interact with certain mediactions you should take tylenol as needed for pain

## 2017-06-15 NOTE — ED Triage Notes (Signed)
Pt here for pain in right foot near achilles. Denies injury. X 3 days.

## 2017-06-19 DIAGNOSIS — M7661 Achilles tendinitis, right leg: Secondary | ICD-10-CM | POA: Diagnosis not present

## 2017-06-19 DIAGNOSIS — M19071 Primary osteoarthritis, right ankle and foot: Secondary | ICD-10-CM | POA: Diagnosis not present

## 2017-07-10 DIAGNOSIS — M25571 Pain in right ankle and joints of right foot: Secondary | ICD-10-CM | POA: Diagnosis not present

## 2017-07-10 DIAGNOSIS — M7660 Achilles tendinitis, unspecified leg: Secondary | ICD-10-CM | POA: Diagnosis not present

## 2017-07-10 DIAGNOSIS — M545 Low back pain: Secondary | ICD-10-CM | POA: Diagnosis not present

## 2017-07-10 DIAGNOSIS — M25521 Pain in right elbow: Secondary | ICD-10-CM | POA: Diagnosis not present

## 2017-07-10 DIAGNOSIS — M549 Dorsalgia, unspecified: Secondary | ICD-10-CM | POA: Diagnosis not present

## 2017-07-17 DIAGNOSIS — M7660 Achilles tendinitis, unspecified leg: Secondary | ICD-10-CM | POA: Diagnosis not present

## 2017-07-17 DIAGNOSIS — M25571 Pain in right ankle and joints of right foot: Secondary | ICD-10-CM | POA: Diagnosis not present

## 2017-07-17 DIAGNOSIS — M25521 Pain in right elbow: Secondary | ICD-10-CM | POA: Diagnosis not present

## 2017-08-15 DIAGNOSIS — E118 Type 2 diabetes mellitus with unspecified complications: Secondary | ICD-10-CM | POA: Diagnosis not present

## 2017-08-15 DIAGNOSIS — Z23 Encounter for immunization: Secondary | ICD-10-CM | POA: Diagnosis not present

## 2017-08-15 DIAGNOSIS — M7660 Achilles tendinitis, unspecified leg: Secondary | ICD-10-CM | POA: Diagnosis not present

## 2017-08-15 DIAGNOSIS — M25571 Pain in right ankle and joints of right foot: Secondary | ICD-10-CM | POA: Diagnosis not present

## 2017-08-15 DIAGNOSIS — M25521 Pain in right elbow: Secondary | ICD-10-CM | POA: Diagnosis not present

## 2017-09-04 DIAGNOSIS — N183 Chronic kidney disease, stage 3 (moderate): Secondary | ICD-10-CM | POA: Diagnosis not present

## 2017-09-04 DIAGNOSIS — E118 Type 2 diabetes mellitus with unspecified complications: Secondary | ICD-10-CM | POA: Diagnosis not present

## 2017-09-04 DIAGNOSIS — E789 Disorder of lipoprotein metabolism, unspecified: Secondary | ICD-10-CM | POA: Diagnosis not present

## 2017-09-25 DIAGNOSIS — E119 Type 2 diabetes mellitus without complications: Secondary | ICD-10-CM | POA: Diagnosis not present

## 2017-09-25 DIAGNOSIS — N183 Chronic kidney disease, stage 3 (moderate): Secondary | ICD-10-CM | POA: Diagnosis not present

## 2017-09-25 DIAGNOSIS — I129 Hypertensive chronic kidney disease with stage 1 through stage 4 chronic kidney disease, or unspecified chronic kidney disease: Secondary | ICD-10-CM | POA: Diagnosis not present

## 2017-09-27 DIAGNOSIS — Z7689 Persons encountering health services in other specified circumstances: Secondary | ICD-10-CM | POA: Diagnosis not present

## 2017-09-27 DIAGNOSIS — Z23 Encounter for immunization: Secondary | ICD-10-CM | POA: Diagnosis not present

## 2017-09-27 DIAGNOSIS — M109 Gout, unspecified: Secondary | ICD-10-CM | POA: Diagnosis not present

## 2017-09-27 DIAGNOSIS — M1990 Unspecified osteoarthritis, unspecified site: Secondary | ICD-10-CM | POA: Diagnosis not present

## 2017-09-27 DIAGNOSIS — N189 Chronic kidney disease, unspecified: Secondary | ICD-10-CM | POA: Diagnosis not present

## 2017-11-22 DIAGNOSIS — N189 Chronic kidney disease, unspecified: Secondary | ICD-10-CM | POA: Diagnosis not present

## 2017-11-22 DIAGNOSIS — M109 Gout, unspecified: Secondary | ICD-10-CM | POA: Diagnosis not present

## 2017-11-22 DIAGNOSIS — M1990 Unspecified osteoarthritis, unspecified site: Secondary | ICD-10-CM | POA: Diagnosis not present

## 2017-11-27 DIAGNOSIS — E118 Type 2 diabetes mellitus with unspecified complications: Secondary | ICD-10-CM | POA: Diagnosis not present

## 2017-11-27 DIAGNOSIS — M109 Gout, unspecified: Secondary | ICD-10-CM | POA: Diagnosis not present

## 2017-12-04 DIAGNOSIS — N183 Chronic kidney disease, stage 3 (moderate): Secondary | ICD-10-CM | POA: Diagnosis not present

## 2017-12-04 DIAGNOSIS — E789 Disorder of lipoprotein metabolism, unspecified: Secondary | ICD-10-CM | POA: Diagnosis not present

## 2017-12-04 DIAGNOSIS — E118 Type 2 diabetes mellitus with unspecified complications: Secondary | ICD-10-CM | POA: Diagnosis not present

## 2017-12-13 ENCOUNTER — Other Ambulatory Visit: Payer: Self-pay | Admitting: Obstetrics and Gynecology

## 2017-12-13 DIAGNOSIS — Z1231 Encounter for screening mammogram for malignant neoplasm of breast: Secondary | ICD-10-CM

## 2017-12-19 DIAGNOSIS — I129 Hypertensive chronic kidney disease with stage 1 through stage 4 chronic kidney disease, or unspecified chronic kidney disease: Secondary | ICD-10-CM | POA: Diagnosis not present

## 2017-12-19 DIAGNOSIS — E119 Type 2 diabetes mellitus without complications: Secondary | ICD-10-CM | POA: Diagnosis not present

## 2017-12-19 DIAGNOSIS — N183 Chronic kidney disease, stage 3 (moderate): Secondary | ICD-10-CM | POA: Diagnosis not present

## 2018-01-09 ENCOUNTER — Ambulatory Visit
Admission: RE | Admit: 2018-01-09 | Discharge: 2018-01-09 | Disposition: A | Discharge status: Home / Self Care | Payer: 59 | Source: Ambulatory Visit | Attending: Obstetrics and Gynecology | Admitting: Obstetrics and Gynecology

## 2018-01-09 DIAGNOSIS — Z1231 Encounter for screening mammogram for malignant neoplasm of breast: Secondary | ICD-10-CM | POA: Diagnosis not present

## 2018-01-15 DIAGNOSIS — N183 Chronic kidney disease, stage 3 (moderate): Secondary | ICD-10-CM | POA: Diagnosis not present

## 2018-01-15 DIAGNOSIS — E1140 Type 2 diabetes mellitus with diabetic neuropathy, unspecified: Secondary | ICD-10-CM | POA: Diagnosis not present

## 2018-01-15 DIAGNOSIS — E118 Type 2 diabetes mellitus with unspecified complications: Secondary | ICD-10-CM | POA: Diagnosis not present

## 2018-03-12 DIAGNOSIS — N183 Chronic kidney disease, stage 3 (moderate): Secondary | ICD-10-CM | POA: Diagnosis not present

## 2018-03-12 DIAGNOSIS — M109 Gout, unspecified: Secondary | ICD-10-CM | POA: Diagnosis not present

## 2018-03-12 DIAGNOSIS — E119 Type 2 diabetes mellitus without complications: Secondary | ICD-10-CM | POA: Diagnosis not present

## 2018-03-12 DIAGNOSIS — I129 Hypertensive chronic kidney disease with stage 1 through stage 4 chronic kidney disease, or unspecified chronic kidney disease: Secondary | ICD-10-CM | POA: Diagnosis not present

## 2018-03-12 DIAGNOSIS — I2699 Other pulmonary embolism without acute cor pulmonale: Secondary | ICD-10-CM | POA: Diagnosis not present

## 2018-03-13 DIAGNOSIS — M79672 Pain in left foot: Secondary | ICD-10-CM | POA: Diagnosis not present

## 2018-03-13 DIAGNOSIS — M79671 Pain in right foot: Secondary | ICD-10-CM | POA: Diagnosis not present

## 2018-03-13 DIAGNOSIS — M71571 Other bursitis, not elsewhere classified, right ankle and foot: Secondary | ICD-10-CM | POA: Diagnosis not present

## 2018-03-13 DIAGNOSIS — M21612 Bunion of left foot: Secondary | ICD-10-CM | POA: Diagnosis not present

## 2018-03-13 DIAGNOSIS — B079 Viral wart, unspecified: Secondary | ICD-10-CM | POA: Diagnosis not present

## 2018-03-14 DIAGNOSIS — N183 Chronic kidney disease, stage 3 (moderate): Secondary | ICD-10-CM | POA: Diagnosis not present

## 2018-03-14 DIAGNOSIS — E118 Type 2 diabetes mellitus with unspecified complications: Secondary | ICD-10-CM | POA: Diagnosis not present

## 2018-03-20 DIAGNOSIS — N183 Chronic kidney disease, stage 3 (moderate): Secondary | ICD-10-CM | POA: Diagnosis not present

## 2018-03-20 DIAGNOSIS — E789 Disorder of lipoprotein metabolism, unspecified: Secondary | ICD-10-CM | POA: Diagnosis not present

## 2018-03-20 DIAGNOSIS — E1140 Type 2 diabetes mellitus with diabetic neuropathy, unspecified: Secondary | ICD-10-CM | POA: Diagnosis not present

## 2018-03-21 DIAGNOSIS — M109 Gout, unspecified: Secondary | ICD-10-CM | POA: Diagnosis not present

## 2018-08-22 ENCOUNTER — Encounter: Payer: Self-pay | Admitting: Emergency Medicine

## 2018-08-22 DIAGNOSIS — F319 Bipolar disorder, unspecified: Secondary | ICD-10-CM

## 2018-09-05 ENCOUNTER — Ambulatory Visit (INDEPENDENT_AMBULATORY_CARE_PROVIDER_SITE_OTHER): Payer: 59 | Admitting: Psychiatry

## 2018-09-05 ENCOUNTER — Encounter: Payer: Self-pay | Admitting: Psychiatry

## 2018-09-05 DIAGNOSIS — F3174 Bipolar disorder, in full remission, most recent episode manic: Secondary | ICD-10-CM | POA: Diagnosis not present

## 2018-09-05 MED ORDER — OLANZAPINE 5 MG PO TABS
5.0000 mg | ORAL_TABLET | Freq: Every day | ORAL | 2 refills | Status: DC
Start: 2018-09-05 — End: 2019-09-11

## 2018-09-05 MED ORDER — BUPROPION HCL 100 MG PO TABS: 100.0000 mg | ORAL_TABLET | Freq: Every day | ORAL | 2 refills | Status: DC

## 2018-09-05 NOTE — Progress Notes (Signed)
Cynthia Cameron [id number] 10-18-1960 58 y.o.  Subjective:   Patient ID:  Cynthia Cameron is a 58 y.o. (DOB February 21, 1961) female.  Chief Complaint:  Chief Complaint  Patient presents with  . Follow-up    Medication management    HPI  Last seen Jan  Cynthia Cameron presents to the office today for follow-up of bipolar disorder 1.  Patient reports stable mood and denies depressed or irritable moods.  No mood swings.  No episodes paranoia, fear.  Patient denies any recent difficulty with anxiety.  Patient denies difficulty with sleep initiation or maintenance. Denies appetite disturbance.  Patient reports that energy and motivation have been good.  Patient denies any difficulty with concentration.  Patient denies any suicidal ideation.  Not drowsy.  Dropped 15-20# with diet change.   Review of Systems:  Review of Systems  Musculoskeletal: Positive for arthralgias.  Neurological: Negative for tremors and weakness.  Psychiatric/Behavioral: Negative for agitation, behavioral problems, confusion, decreased concentration, dysphoric mood, hallucinations, self-injury, sleep disturbance and suicidal ideas. The patient is not nervous/anxious and is not hyperactive.     Medications: I have reviewed the patient's current medications.  Current Outpatient Medications  Medication Sig Dispense Refill  . amLODipine (NORVASC) 5 MG tablet TAKE 1 TABLET BY MOUTH EVERY DAY 30 tablet 4  . buPROPion (WELLBUTRIN) 100 MG tablet Take 50 mg by mouth daily.     . furosemide (LASIX) 40 MG tablet Take 1 tablet (40 mg total) by mouth daily. 30 tablet 1  . KLOR-CON 10 10 MEQ tablet Take 2 tablets by mouth 2 (two) times daily.    Marland Kitchen lisinopril (PRINIVIL,ZESTRIL) 20 MG tablet Take 1 tablet (20 mg total) by mouth daily. PATIENT NEEDS OFFICE VISIT FOR ADDITIONAL REFILLS 30 tablet 0  . Multiple Vitamin (MULTIVITAMIN WITH MINERALS) TABS Take 1 tablet by mouth daily.    . nebivolol (BYSTOLIC) 10 MG tablet Take  5 mg by mouth daily.     Marland Kitchen OLANZapine (ZYPREXA) 5 MG tablet Take 5 mg by mouth at bedtime.    Alveda Reasons 20 MG TABS tablet TAKE 1 TABLET BY MOUTH ONCE DAILY WITH SUPPER. 30 tablet 2  . cloNIDine (CATAPRES) 0.2 MG tablet Take 0.2 mg by mouth at bedtime.    . clotrimazole-betamethasone (LOTRISONE) cream Apply 1 application topically 2 (two) times daily. (Patient not taking: Reported on 09/05/2018) 30 g 1  . cyclobenzaprine (FLEXERIL) 10 MG tablet Take 1 tablet (10 mg total) by mouth 3 (three) times daily as needed for muscle spasms. (Patient not taking: Reported on 09/05/2018) 30 tablet 0  . diclofenac (CATAFLAM) 50 MG tablet Take 1 tablet (50 mg total) by mouth 3 (three) times daily. (Patient not taking: Reported on 09/05/2018) 30 tablet 0  . glucose blood (FREESTYLE LITE) test strip To check blood sugars tid dx code 250.00 (Patient not taking: Reported on 09/05/2018) 100 each 3  . insulin aspart (NOVOLOG) 100 UNIT/ML injection Inject 0-15 Units into the skin 3 (three) times daily with meals. (Patient not taking: Reported on 09/05/2018) 1 vial 1  . insulin glargine (LANTUS) 100 UNIT/ML injection Inject 0.2 mLs (20 Units total) into the skin at bedtime. (Patient not taking: Reported on 09/05/2018) 10 mL 1  . Insulin Syringes, Disposable, U-100 0.5 ML MISC 3-20 Units by Does not apply route 4 (four) times daily -  before meals and at bedtime. (Patient not taking: Reported on 09/05/2018) 120 each 1  . Liraglutide (VICTOZA [place]) Inject 1.2 Units into the skin daily.     Marland Kitchen  oxyCODONE-acetaminophen (PERCOCET/ROXICET) 5-325 MG per tablet Take 1 tablet by mouth as needed. (Patient not taking: Reported on 09/05/2018) 30 tablet 0  . pravastatin (PRAVACHOL) 40 MG tablet TAKE 1 TABLET (40 MG TOTAL) BY MOUTH DAILY. (Patient not taking: Reported on 09/05/2018) 90 tablet 1   No current facility-administered medications for this visit.     Medication Side Effects: None  Allergies: No Known Allergies  Past Medical History:   Diagnosis Date  . Allergy   . Bipolar depression ([place])   . Chest pain    "since 09/2012; hurts all the time" (12/24/2012)  . Depression   . DVT, lower extremity ([place]) 10/2012   "2" (12/24/2012)  . High cholesterol   . Hypertension   . Iron deficiency anemia   . Pneumonia 09/2012  . Pulmonary embolism on right ([place]) 09/2012  . Shortness of breath   . Type II diabetes mellitus ([facility])     Family History  Problem Relation Age of Onset  . Diabetes Mother   . Atrial fibrillation Mother   . Hypertension Father   . Breast cancer Neg Hx     Social History   Socioeconomic History  . Marital status: Married    Spouse name: Not on file  . Number of children: Not on file  . Years of education: Not on file  . Highest education level: Not on file  Occupational History  . Not on file  Social Needs  . Financial resource strain: Not on file  . Food insecurity:    Worry: Not on file    Inability: Not on file  . Transportation needs:    Medical: Not on file    Non-medical: Not on file  Tobacco Use  . Smoking status: Never Smoker  . Smokeless tobacco: Never Used  Substance and Sexual Activity  . Alcohol use: Yes    Alcohol/week: 0.0 standard drinks    Comment: Rarely.  . Drug use: No  . Sexual activity: Not on file  Lifestyle  . Physical activity:    Days per week: Not on file    Minutes per session: Not on file  . Stress: Not on file  Relationships  . Social connections:    Talks on phone: Not on file    Gets together: Not on file    Attends religious service: Not on file    Active member of club or organization: Not on file    Attends meetings of clubs or organizations: Not on file    Relationship status: Not on file  . Intimate partner violence:    Fear of current or ex partner: Not on file    Emotionally abused: Not on file    Physically abused: Not on file    Forced sexual activity: Not on file  Other Topics Concern  . Not on file  Social History Narrative   [place]  in [place]. 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     Past Medical History, Surgical history, Social history, and Family history were reviewed and updated as appropriate.   Please see review of systems for further details on the patient's review from today.   Objective:   Physical Exam:  LMP 09/16/2010   Physical Exam Constitutional:      General: She is not in acute distress.    Appearance: She is well-developed.  Musculoskeletal:        General: No deformity.  Neurological:     Mental Status: She is alert  and oriented to person, place, and time.     Motor: No tremor.     Coordination: Coordination normal.     Gait: Gait normal.  Psychiatric:        Attention and Perception: Attention and perception normal.        Mood and Affect: Mood is not anxious or depressed. Affect is not labile, blunt, angry or inappropriate.        Speech: Speech normal.        Behavior: Behavior normal.        Thought Content: Thought content normal. Thought content does not include homicidal or suicidal ideation. Thought content does not include homicidal or suicidal plan.        Cognition and Memory: Cognition normal.        Judgment: Judgment normal.     Comments: Insight intact. No auditory or visual hallucinations. No delusions.      Lab Review:     Component Value Date/Time   NA 142 09/24/2013 1736   K 3.1 (L) 09/24/2013 1736   CL 102 09/24/2013 1736   CO2 30 09/24/2013 1736   GLUCOSE 136 (H) 09/24/2013 1736   BUN 15 09/24/2013 1736   CREATININE 1.48 (H) 09/24/2013 1736   CALCIUM 10.2 09/24/2013 1736   PROT 7.7 09/24/2013 1736   ALBUMIN 4.3 09/24/2013 1736   AST 17 09/24/2013 1736   ALT 16 09/24/2013 1736   ALKPHOS 103 09/24/2013 1736   BILITOT 0.4 09/24/2013 1736   GFRNONAA 32 (L) 12/25/2012 0545   GFRNONAA 40 (L) 01/29/2012 1616   GFRAA 37 (L) 12/25/2012 0545   GFRAA 46 (L) 01/29/2012 1616       Component Value Date/Time   WBC 6.4 06/26/2016 1055    WBC 7.2 05/11/2014 1500   RBC 4.60 06/26/2016 1055   RBC 5.21 (H) 05/11/2014 1500   RBC 5.21 (H) 05/11/2014 1500   HGB 12.8 06/26/2016 1055   HCT 38.3 06/26/2016 1055   PLT 298 06/26/2016 1055   MCV 83 06/26/2016 1055   MCH 27.8 06/26/2016 1055   MCH 26.9 05/11/2014 1500   MCHC 33.4 06/26/2016 1055   MCHC 33.9 05/11/2014 1500   RDW 14.9 06/26/2016 1055   LYMPHSABS 1.7 06/26/2016 1055   MONOABS 0.4 05/11/2014 1500   EOSABS 0.2 06/26/2016 1055   BASOSABS 0.0 06/26/2016 1055    No results found for: POCLITH, LITHIUM   No results found for: PHENYTOIN, PHENOBARB, VALPROATE, CBMZ   .res Assessment: Plan:    Bipolar 1 disorder, manic, full remission ([place])  Very med sensitive so gets by with low dosages.  Diabetes resolved with weight loss.  Mckynleigh has been under my psychiatric care since October 1998 when she was diagnosed with bipolar disorder mania with mild mood congruent psychotic features.  She responded very well very quickly to very low-dose Zyprexa 2.5 mg nightly plus gabapentin.  Gabapentin was weaned without complications later on she had some mild depression which remitted with very low-dose Wellbutrin 50 to 100 mg nightly.  She is been stable for several years since then.  She has a history of hypomania when olanzapine was reduced to 1.25 mg daily.  Discussed potential metabolic side effects associated with atypical antipsychotics, as well as potential risk for movement side effects. Advised pt to contact office if movement side effects occur.   No med changes indicated.  Taking 1/2 of 5 mg olanzapine and 1/2 of Wellbutrin  Has been very stable.  FU 1 year  Hiram Comber  Clovis Pu, MD, DFAPA   Please see After Visit Summary for patient specific instructions.  No future appointments.  No orders of the defined types were placed in this encounter.     -------------------------------

## 2018-09-06 ENCOUNTER — Telehealth: Payer: Self-pay | Admitting: Psychiatry

## 2018-09-06 NOTE — Telephone Encounter (Signed)
See note  Please advise on her dosage

## 2018-09-06 NOTE — Telephone Encounter (Signed)
Cynthia Cameron called to report that she failed to discuss her Zyprexa with you are appt. Yesterday.  She is only taking 1/2 tab of 2.5 mg.  It has worked fine.  Are you ok with her continuing this?  Please call to discuss with her before you send in new prescription because if you are ok with this the prescription will need to be changed.

## 2018-09-09 ENCOUNTER — Other Ambulatory Visit: Payer: Self-pay

## 2018-09-09 MED ORDER — BUPROPION HCL ER (SR) 100 MG PO TB12: 100.0000 mg | ORAL_TABLET | Freq: Two times a day (BID) | ORAL | 1 refills | Status: DC

## 2018-10-14 ENCOUNTER — Emergency Department (HOSPITAL_COMMUNITY)
Admission: EM | Admit: 2018-10-14 | Discharge: 2018-10-15 | Disposition: A | Discharge status: Home / Self Care | Payer: 59 | Attending: Emergency Medicine | Admitting: Emergency Medicine

## 2018-10-14 ENCOUNTER — Ambulatory Visit (HOSPITAL_COMMUNITY)
Admission: EM | Admit: 2018-10-14 | Discharge: 2018-10-14 | Disposition: A | Discharge status: Other Acute Inpatient Hospital | Payer: 59 | Source: Home / Self Care

## 2018-10-14 ENCOUNTER — Emergency Department (HOSPITAL_COMMUNITY): Payer: 59

## 2018-10-14 ENCOUNTER — Other Ambulatory Visit: Payer: Self-pay

## 2018-10-14 DIAGNOSIS — R0789 Other chest pain: Secondary | ICD-10-CM | POA: Insufficient documentation

## 2018-10-14 DIAGNOSIS — E876 Hypokalemia: Secondary | ICD-10-CM | POA: Diagnosis not present

## 2018-10-14 LAB — CBC
HCT: 36.2 % (ref 36.0–46.0)
Hemoglobin: 11.6 g/dL — ABNORMAL LOW (ref 12.0–15.0)
MCH: 27.2 pg (ref 26.0–34.0)
MCHC: 32 g/dL (ref 30.0–36.0)
MCV: 84.8 fL (ref 80.0–100.0)
Platelets: 257 10*3/uL (ref 150–400)
RBC: 4.27 MIL/uL (ref 3.87–5.11)
RDW: 14.1 % (ref 11.5–15.5)
WBC: 7.3 10*3/uL (ref 4.0–10.5)
nRBC: 0 % (ref 0.0–0.2)

## 2018-10-14 LAB — I-STAT TROPONIN, ED: Troponin i, poc: 0.05 ng/mL (ref 0.00–0.08)

## 2018-10-14 LAB — BASIC METABOLIC PANEL
Anion gap: 10 (ref 5–15)
BUN: 38 mg/dL — ABNORMAL HIGH (ref 6–20)
CALCIUM: 9.4 mg/dL (ref 8.9–10.3)
CO2: 25 mmol/L (ref 22–32)
Chloride: 103 mmol/L (ref 98–111)
Creatinine, Ser: 1.53 mg/dL — ABNORMAL HIGH (ref 0.44–1.00)
GFR calc Af Amer: 43 mL/min — ABNORMAL LOW (ref >60)
GFR calc non Af Amer: 37 mL/min — ABNORMAL LOW (ref >60)
Glucose, Bld: 94 mg/dL (ref 70–99)
Potassium: 2.9 mmol/L — ABNORMAL LOW (ref 3.5–5.1)
Sodium: 138 mmol/L (ref 135–145)

## 2018-10-14 LAB — I-STAT BETA HCG BLOOD, ED (MC, WL, AP ONLY): I-stat hCG, quantitative: <5 m[IU]/mL (ref <5)

## 2018-10-14 MED ORDER — SODIUM CHLORIDE 0.9% FLUSH
3.0000 mL | Freq: Once | INTRAVENOUS | Status: AC
  Administered 2018-10-15: 3 mL via INTRAVENOUS

## 2018-10-14 NOTE — ED Triage Notes (Signed)
Pt arrives POV for eval of chest pain x 1-2 weeks, worse tonight w/ sharp substernal CP after getting off work. Pt denies associated SOB. Pt also c/o posterior calf pain R>L. Pt reports hx of DVT, currently anticoagulated on xarelto. Pt reports recent flight to and from [place] last week. Pt reports she normally is on lasix for leg swelling, but it resolved and MD took her off xarelto. Pt in NARD/NAD in triage.

## 2018-10-14 NOTE — ED Notes (Signed)
Pt to go to ED for further eval 

## 2018-10-15 ENCOUNTER — Emergency Department (HOSPITAL_COMMUNITY): Payer: 59

## 2018-10-15 ENCOUNTER — Emergency Department (HOSPITAL_BASED_OUTPATIENT_CLINIC_OR_DEPARTMENT_OTHER): Payer: 59

## 2018-10-15 DIAGNOSIS — M7989 Other specified soft tissue disorders: Secondary | ICD-10-CM | POA: Diagnosis not present

## 2018-10-15 LAB — I-STAT TROPONIN, ED: Troponin i, poc: 0.04 ng/mL (ref 0.00–0.08)

## 2018-10-15 MED ORDER — IOPAMIDOL (ISOVUE-370) INJECTION 76%
INTRAVENOUS | Status: AC
  Filled 2018-10-15: qty 100

## 2018-10-15 MED ORDER — POTASSIUM CHLORIDE CRYS ER 20 MEQ PO TBCR
40.0000 meq | EXTENDED_RELEASE_TABLET | Freq: Once | ORAL | Status: AC
  Administered 2018-10-15: 40 meq via ORAL
  Filled 2018-10-15: qty 2

## 2018-10-15 MED ORDER — IOPAMIDOL (ISOVUE-370) INJECTION 76%
60.0000 mL | Freq: Once | INTRAVENOUS | Status: AC | PRN
  Administered 2018-10-15: 60 mL via INTRAVENOUS

## 2018-10-15 NOTE — ED Provider Notes (Signed)
TIME SEEN: 3:20 AM  CHIEF COMPLAINT: Chest pain, shortness of breath, right leg pain  HPI: Patient is a 58 year old female with history of hypertension, diabetes, hyperlipidemia, kidney disease, previous DVT and PE on Xarelto who presents to the emergency department with 2 weeks of intermittent central sharp chest pain with shortness of breath.  She states this pain comes on all of a sudden with no aggravating or alleviating factors.  Not exertional or pleuritic.  No chest pain currently.  States that she became more concerned today when she started having right posterior calf pain.  States her legs are frequently swollen bilaterally and her doctor just took her off of Lasix.  She states that she did not have pain with her previous pulmonary emboli.  She is not sure if this feels similar.  No injury to the right leg.  No fever or cough.  Last stress test was years ago.  No longer followed by Dr. Doylene Canard.  ROS: See HPI Constitutional: no fever  Eyes: no drainage  ENT: no runny nose   Cardiovascular:   chest pain  Resp: SOB  GI: no vomiting GU: no dysuria Integumentary: no rash  Allergy: no hives  Musculoskeletal:  leg swelling  Neurological: no slurred speech ROS otherwise negative  PAST MEDICAL HISTORY/PAST SURGICAL HISTORY:  Past Medical History:  Diagnosis Date  . Allergy   . Bipolar depression ([place])   . Chest pain    "since 09/2012; hurts all the time" (12/24/2012)  . Depression   . DVT, lower extremity ([place]) 10/2012   "2" (12/24/2012)  . High cholesterol   . Hypertension   . Iron deficiency anemia   . Pneumonia 09/2012  . Pulmonary embolism on right ([place]) 09/2012  . Shortness of breath   . Type II diabetes mellitus ([facility])     MEDICATIONS:  Prior to Admission medications   Medication Sig Start Date End Date Taking? Authorizing Provider  amLODipine (NORVASC) 5 MG tablet TAKE 1 TABLET BY MOUTH EVERY DAY 01/02/13   Copland, Gay Filler, MD  buPROPion (WELLBUTRIN SR) 100 MG 12 hr  tablet Take 1 tablet (100 mg total) by mouth 2 (two) times daily. 09/09/18   Cottle, Billey Co., MD  cloNIDine (CATAPRES) 0.2 MG tablet Take 0.2 mg by mouth at bedtime.    [provider]  clotrimazole-betamethasone (LOTRISONE) cream Apply 1 application topically 2 (two) times daily. Patient not taking: Reported on 09/05/2018 06/26/16   Annia Belt, MD  cyclobenzaprine (FLEXERIL) 10 MG tablet Take 1 tablet (10 mg total) by mouth 3 (three) times daily as needed for muscle spasms. Patient not taking: Reported on 09/05/2018 09/07/13   Shawnee Knapp, MD  diclofenac (CATAFLAM) 50 MG tablet Take 1 tablet (50 mg total) by mouth 3 (three) times daily. Patient not taking: Reported on 09/05/2018 05/09/16   Billy Fischer, MD  furosemide (LASIX) 40 MG tablet Take 1 tablet (40 mg total) by mouth daily. 11/28/12   Dixie Dials, MD  glucose blood (FREESTYLE LITE) test strip To check blood sugars tid dx code 250.00 Patient not taking: Reported on 09/05/2018 12/30/12   Copland, Gay Filler, MD  insulin aspart (NOVOLOG) 100 UNIT/ML injection Inject 0-15 Units into the skin 3 (three) times daily with meals. Patient not taking: Reported on 09/05/2018 12/25/12   Dixie Dials, MD  insulin glargine (LANTUS) 100 UNIT/ML injection Inject 0.2 mLs (20 Units total) into the skin at bedtime. Patient not taking: Reported on 09/05/2018 12/25/12   Dixie Dials,  MD  Insulin Syringes, Disposable, U-100 0.5 ML MISC 3-20 Units by Does not apply route 4 (four) times daily -  before meals and at bedtime. Patient not taking: Reported on 09/05/2018 12/25/12   Dixie Dials, MD  KLOR-CON 10 10 MEQ tablet Take 2 tablets by mouth 2 (two) times daily. 05/25/13   [provider]  Liraglutide (VICTOZA [place]) Inject 1.2 Units into the skin daily.     [provider]  lisinopril (PRINIVIL,ZESTRIL) 20 MG tablet Take 1 tablet (20 mg total) by mouth daily. PATIENT NEEDS OFFICE VISIT FOR ADDITIONAL REFILLS 06/12/13   Copland, Gay Filler, MD   Multiple Vitamin (MULTIVITAMIN WITH MINERALS) TABS Take 1 tablet by mouth daily.    [provider]  nebivolol (BYSTOLIC) 10 MG tablet Take 5 mg by mouth daily.     [provider]  OLANZapine (ZYPREXA) 5 MG tablet Take 1 tablet (5 mg total) by mouth at bedtime. 09/05/18   Cottle, Billey Co., MD  oxyCODONE-acetaminophen (PERCOCET/ROXICET) 5-325 MG per tablet Take 1 tablet by mouth as needed. Patient not taking: Reported on 09/05/2018 09/07/13   Shawnee Knapp, MD  pravastatin (PRAVACHOL) 40 MG tablet TAKE 1 TABLET (40 MG TOTAL) BY MOUTH DAILY. Patient not taking: Reported on 09/05/2018 12/25/12   Georgiann Mccoy M, PA-C  XARELTO 20 MG TABS tablet TAKE 1 TABLET BY MOUTH ONCE DAILY WITH SUPPER. 10/01/14   Chesley Mires, MD    ALLERGIES:  No Known Allergies  SOCIAL HISTORY:  Social History   Tobacco Use  . Smoking status: Never Smoker  . Smokeless tobacco: Never Used  Substance Use Topics  . Alcohol use: Yes    Alcohol/week: 0.0 standard drinks    Comment: Rarely.    FAMILY HISTORY: Family History  Problem Relation Age of Onset  . Diabetes Mother   . Atrial fibrillation Mother   . Hypertension Father   . Breast cancer Neg Hx     EXAM: BP (!) 159/99   Pulse 73   Temp 97.8 F (36.6 C) (Oral)   Resp 16   Ht 5\' 4"  (1.626 m)   Wt 76.7 kg   LMP 09/16/2010   SpO2 99%   BMI 29.01 kg/m  CONSTITUTIONAL: Alert and oriented and responds appropriately to questions. Well-appearing; well-nourished HEAD: Normocephalic EYES: Conjunctivae clear, pupils appear equal, EOMI ENT: normal nose; moist mucous membranes NECK: Supple, no meningismus, no nuchal rigidity, no LAD  CARD: RRR; S1 and S2 appreciated; no murmurs, no clicks, no rubs, no gallops CHEST:  Chest wall is nontender to palpation.  No crepitus, ecchymosis, erythema, warmth, rash or other lesions present.   RESP: Normal chest excursion without splinting or tachypnea; breath sounds clear and equal bilaterally; no wheezes,  no rhonchi, no rales, no hypoxia or respiratory distress, speaking full sentences ABD/GI: Normal bowel sounds; non-distended; soft, non-tender, no rebound, no guarding, no peritoneal signs, no hepatosplenomegaly BACK:  The back appears normal and is non-tender to palpation, there is no CVA tenderness EXT: Normal ROM in all joints; non-tender to palpation; nonpitting edema and bilateral ankles and feet; normal capillary refill; no cyanosis, tender to palpation in the right posterior calf and behind the right knee without obvious Baker's cyst and no redness or warmth, no joint effusion of the right knee, 2+ DP pulses bilaterally SKIN: Normal color for age and race; warm; no rash NEURO: Moves all extremities equally PSYCH: The patient's mood and manner are appropriate. Grooming and personal hygiene are appropriate.  MEDICAL DECISION  MAKING: Patient here with chest pain, shortness of breath for 2 weeks and right lower extremity pain for the past week.  EKG shows improvement of T wave changes in inferior lateral leads compared to EKG in 2014.  No new ischemic abnormality.  Does have risk factors for ACS but has a very atypical story.  First troponin negative.  Will repeat second troponin here in the ED.  Chest x-ray clear without signs of volume overload, pneumothorax or pneumonia.  Will obtain CTA of her chest to evaluate for possible PE.  She reports compliance with her Xarelto.  States she has not been a can off Xarelto despite nursing notes.  She was taken off of Lasix.  ED PROGRESS: Second troponin negative.  CTA of the chest shows no pulmonary embolus.  Will obtain venous Doppler of the right lower extremity.  Patient resting comfortably and chest pain-free.  7:40 AM  Signed out to Dr. Sherry Ruffing to follow-up on patient's venous Doppler.   I reviewed all nursing notes, vitals, pertinent previous records, EKGs, lab and urine results, imaging (as available).    EKG Interpretation  Date/Time:  Monday  October 14 2018 19:46:34 EST Ventricular Rate:  70 PR Interval:  156 QRS Duration: 78 QT Interval:  406 QTC Calculation: 438 R Axis:   43 Text Interpretation:  Normal sinus rhythm Nonspecific T wave abnormality Abnormal ECG T wave abnormality in inferior and lateral leads has improved compared to previous in 2014 Confirmed by Montrae Braithwaite, Cyril Mourning [id number]) on 10/15/2018 3:27:41 AM         Avir Deruiter, Delice Bison, DO 10/15/18 3112

## 2018-10-15 NOTE — Progress Notes (Signed)
Right lower extremity venous duplex completed. Refer to "CV Proc" under chart review to view preliminary results.  10/15/2018 8:36 AM Maudry Mayhew, MHA, RVT, RDCS, RDMS

## 2018-10-15 NOTE — ED Notes (Signed)
Patient transported to Ultrasound 

## 2018-10-15 NOTE — ED Provider Notes (Signed)
Care assumed from Dr. Leonides Schanz.  At time of transfer care, patient is awaiting results of lower extremity Doppler to look for DVT.  Patient already had work-up including a negative CTA of her chest and negative troponin x2.  Plan of care is to discharge patient if her ultrasound does not show evidence of DVT today.  Patient presented for chest pain for 2 weeks with a history of DVT and possible PE.  Anticipate discharge if ultrasound is negative.  9:23 AM Ultrasound was negative.  Patient was feeling better.  Patient will follow-up with PCP and understood return precautions.  Patient discharged in good condition with improved symptoms in accordance with previous plan of care.    Clinical Impression: 1. Atypical chest pain   2. Hypokalemia     Disposition: Discharge  Condition: Good  I have discussed the results, Dx and Tx plan with the pt(& family if present). He/she/they expressed understanding and agree(s) with the plan. Discharge instructions discussed at great length. Strict return precautions discussed and pt &/or family have verbalized understanding of the instructions. No further questions at time of discharge.    New Prescriptions   No medications on file    Follow Up: [facility] [address] [place] [address] [phone] Schedule an appointment as soon as possible for a visit    [facility] [address] [id number] [place]       Tegeler, Gwenyth Allegra, MD 10/15/18 [phone]

## 2018-10-15 NOTE — ED Notes (Signed)
D/c reviewed with patient. Encouraged to follow up with PCP per EDP.

## 2018-10-15 NOTE — Discharge Instructions (Addendum)
Your work-up today was overall reassuring.  Your troponin was negative twice and your CT scan showed no evidence of blood clot.  The ultrasound showed no blood clot in your legs.  We feel that you are safe for discharge home as her symptoms have improved.  Please follow-up with your primary doctor and please rest.  If any symptoms change or worsen, please return to the nearest emergency department.

## 2018-12-10 ENCOUNTER — Other Ambulatory Visit: Payer: Self-pay

## 2018-12-10 MED ORDER — BUPROPION HCL ER (SR) 100 MG PO TB12: 100.0000 mg | ORAL_TABLET | Freq: Two times a day (BID) | ORAL | 1 refills | Status: DC

## 2018-12-11 ENCOUNTER — Other Ambulatory Visit: Payer: Self-pay | Admitting: Obstetrics and Gynecology

## 2018-12-11 DIAGNOSIS — Z1231 Encounter for screening mammogram for malignant neoplasm of breast: Secondary | ICD-10-CM

## 2019-01-12 ENCOUNTER — Other Ambulatory Visit: Payer: Self-pay | Admitting: Psychiatry

## 2019-02-11 ENCOUNTER — Ambulatory Visit
Admission: RE | Admit: 2019-02-11 | Discharge: 2019-02-11 | Disposition: A | Discharge status: Home / Self Care | Payer: 59 | Source: Ambulatory Visit | Attending: Obstetrics and Gynecology | Admitting: Obstetrics and Gynecology

## 2019-02-11 ENCOUNTER — Other Ambulatory Visit: Payer: Self-pay

## 2019-02-11 DIAGNOSIS — Z1231 Encounter for screening mammogram for malignant neoplasm of breast: Secondary | ICD-10-CM

## 2019-06-19 ENCOUNTER — Other Ambulatory Visit: Payer: Self-pay | Admitting: Internal Medicine

## 2019-06-19 LAB — NOVEL CORONAVIRUS, NAA: SARS-CoV-2, NAA: NOT DETECTED

## 2019-09-11 ENCOUNTER — Other Ambulatory Visit: Payer: Self-pay

## 2019-09-11 ENCOUNTER — Ambulatory Visit (INDEPENDENT_AMBULATORY_CARE_PROVIDER_SITE_OTHER): Payer: 59 | Admitting: Psychiatry

## 2019-09-11 ENCOUNTER — Encounter: Payer: Self-pay | Admitting: Psychiatry

## 2019-09-11 DIAGNOSIS — F3174 Bipolar disorder, in full remission, most recent episode manic: Secondary | ICD-10-CM

## 2019-09-11 MED ORDER — BUPROPION HCL ER (SR) 100 MG PO TB12: 100.0000 mg | ORAL_TABLET | Freq: Two times a day (BID) | ORAL | 3 refills | Status: DC

## 2019-09-11 MED ORDER — OLANZAPINE 5 MG PO TABS: 5.0000 mg | ORAL_TABLET | Freq: Every day | ORAL | 2 refills | Status: DC

## 2019-09-11 NOTE — Addendum Note (Signed)
Addended by: Reatha Armour on: 09/11/2019 03:42 PM   Modules accepted: Orders

## 2019-09-11 NOTE — Progress Notes (Addendum)
Cynthia Cameron [id number] 01/13/1961 59 y.o.  Subjective:   Patient ID:  Cynthia Cameron is a 59 y.o. (DOB [birth year]) female.  Chief Complaint:  Chief Complaint  Patient presents with  . Follow-up    Medication Management  . Other    Bipolar 1    HPI   Cynthia Cameron presents to the office today for follow-up of bipolar disorder 1.  Last seen January 2020. No med changes indicated.  Taking 1/2 of 5 mg olanzapine and 1/2 of Wellbutrin 100 mg tablets.  No Covid.   Mood has remained good.  No periods of confusion. No fear.  Patient reports stable mood and denies depressed or irritable moods.  No mood swings.  No episodes paranoia, fear.  Patient denies any recent difficulty with anxiety.  Patient denies difficulty with sleep initiation or maintenance. 6-7 hours sleep with schedule.  To work 5 AM.  Denies appetite disturbance.  Patient reports that energy and motivation have been good.  Patient denies any difficulty with concentration.  Patient denies any suicidal ideation.  Not drowsy.  Dropped 15-20# with diet change.  H retired from [employer] early. Son grad Industrial/product designer.  Past Psychiatric Medication Trials: Gabapentin, olanzapine, Wellbutrin SR 100 Under our psychiatric care for bipolar disorder type I since November 1998 1 hosp with manic psychosis with confusion.  Review of Systems:  Review of Systems  Musculoskeletal: Positive for arthralgias.  Neurological: Negative for tremors and weakness.  Psychiatric/Behavioral: Negative for agitation, behavioral problems, confusion, decreased concentration, dysphoric mood, hallucinations, self-injury, sleep disturbance and suicidal ideas. The patient is not nervous/anxious and is not hyperactive.     Medications: I have reviewed the patient's current medications.  Current Outpatient Medications  Medication Sig Dispense Refill  . amLODipine (NORVASC) 5 MG tablet TAKE 1 TABLET BY MOUTH EVERY DAY 30 tablet 4   . buPROPion (WELLBUTRIN SR) 100 MG 12 hr tablet TAKE 1 TABLET BY MOUTH  TWICE DAILY (Patient taking differently: Take 50 mg by mouth daily. ) 180 tablet 3  . cloNIDine (CATAPRES) 0.2 MG tablet Take 0.2 mg by mouth at bedtime.    . furosemide (LASIX) 40 MG tablet Take 1 tablet (40 mg total) by mouth daily. 30 tablet 1  . glucose blood (FREESTYLE LITE) test strip To check blood sugars tid dx code 250.00 100 each 3  . Insulin Syringes, Disposable, U-100 0.5 ML MISC 3-20 Units by Does not apply route 4 (four) times daily -  before meals and at bedtime. 120 each 1  . KLOR-CON 10 10 MEQ tablet Take 2 tablets by mouth 2 (two) times daily.    Marland Kitchen lisinopril (PRINIVIL,ZESTRIL) 20 MG tablet Take 1 tablet (20 mg total) by mouth daily. PATIENT NEEDS OFFICE VISIT FOR ADDITIONAL REFILLS 30 tablet 0  . Multiple Vitamin (MULTIVITAMIN WITH MINERALS) TABS Take 1 tablet by mouth daily.    . nebivolol (BYSTOLIC) 10 MG tablet Take 5 mg by mouth daily.     Marland Kitchen OLANZapine (ZYPREXA) 5 MG tablet Take 1 tablet (5 mg total) by mouth at bedtime. (Patient taking differently: Take 2.5 mg by mouth at bedtime. ) 90 tablet 2  . XARELTO 20 MG TABS tablet TAKE 1 TABLET BY MOUTH ONCE DAILY WITH SUPPER. 30 tablet 2   No current facility-administered medications for this visit.    Medication Side Effects: None  Allergies: No Known Allergies  Past Medical History:  Diagnosis Date  . Allergy   . Bipolar depression ([place])   .  Chest pain    "since 09/2012; hurts all the time" (12/24/2012)  . Depression   . DVT, lower extremity ([place]) 10/2012   "2" (12/24/2012)  . High cholesterol   . Hypertension   . Iron deficiency anemia   . Pneumonia 09/2012  . Pulmonary embolism on right ([place]) 09/2012  . Shortness of breath   . Type II diabetes mellitus ([facility])     Family History  Problem Relation Age of Onset  . Diabetes Mother   . Atrial fibrillation Mother   . Hypertension Father   . Breast cancer Neg Hx     Social History    Socioeconomic History  . Marital status: Married    Spouse name: Not on file  . Number of children: Not on file  . Years of education: Not on file  . Highest education level: Not on file  Occupational History  . Not on file  Tobacco Use  . Smoking status: Never Smoker  . Smokeless tobacco: Never Used  Substance and Sexual Activity  . Alcohol use: Yes    Alcohol/week: 0.0 standard drinks    Comment: Rarely.  . Drug use: No  . Sexual activity: Not on file  Other Topics Concern  . Not on file  Social History Narrative   LIves in [place]. 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    Social Determinants of Health   Financial Resource Strain:   . Difficulty of Paying Living Expenses: Not on file  Food Insecurity:   . Worried About Charity fundraiser in the Last Year: Not on file  . Ran Out of Food in the Last Year: Not on file  Transportation Needs:   . Lack of Transportation (Medical): Not on file  . Lack of Transportation (Non-Medical): Not on file  Physical Activity:   . Days of Exercise per Week: Not on file  . Minutes of Exercise per Session: Not on file  Stress:   . Feeling of Stress : Not on file  Social Connections:   . Frequency of Communication with Friends and Family: Not on file  . Frequency of Social Gatherings with Friends and Family: Not on file  . Attends Religious Services: Not on file  . Active Member of Clubs or Organizations: Not on file  . Attends Archivist Meetings: Not on file  . Marital Status: Not on file  Intimate Partner Violence:   . Fear of Current or Ex-Partner: Not on file  . Emotionally Abused: Not on file  . Physically Abused: Not on file  . Sexually Abused: Not on file    Past Medical History, Surgical history, Social history, and Family history were reviewed and updated as appropriate.   Please see review of systems for further details on the patient's review from today.   Objective:   Physical  Exam:  LMP 09/16/2010   Physical Exam Constitutional:      General: She is not in acute distress.    Appearance: She is well-developed.  Musculoskeletal:        General: No deformity.  Neurological:     Mental Status: She is alert and oriented to person, place, and time.     Motor: No tremor.     Coordination: Coordination normal.     Gait: Gait normal.  Psychiatric:        Attention and Perception: Attention and perception normal.        Mood and Affect: Mood is not anxious  or depressed. Affect is not labile, blunt, angry or inappropriate.        Speech: Speech normal.        Behavior: Behavior normal.        Thought Content: Thought content normal. Thought content does not include homicidal or suicidal ideation. Thought content does not include homicidal or suicidal plan.        Cognition and Memory: Cognition normal.        Judgment: Judgment normal.     Comments: Insight intact. No auditory or visual hallucinations. No delusions.      Lab Review:     Component Value Date/Time   NA 138 10/14/2018 2003   K 2.9 (L) 10/14/2018 2003   CL 103 10/14/2018 2003   CO2 25 10/14/2018 2003   GLUCOSE 94 10/14/2018 2003   BUN 38 (H) 10/14/2018 2003   CREATININE 1.53 (H) 10/14/2018 2003   CREATININE 1.48 (H) 09/24/2013 1736   CALCIUM 9.4 10/14/2018 2003   PROT 7.7 09/24/2013 1736   ALBUMIN 4.3 09/24/2013 1736   AST 17 09/24/2013 1736   ALT 16 09/24/2013 1736   ALKPHOS 103 09/24/2013 1736   BILITOT 0.4 09/24/2013 1736   GFRNONAA 37 (L) 10/14/2018 2003   GFRNONAA 40 (L) 01/29/2012 1616   GFRAA 43 (L) 10/14/2018 2003   GFRAA 46 (L) 01/29/2012 1616       Component Value Date/Time   WBC 7.3 10/14/2018 2003   RBC 4.27 10/14/2018 2003   HGB 11.6 (L) 10/14/2018 2003   HGB 12.8 06/26/2016 1055   HCT 36.2 10/14/2018 2003   HCT 38.3 06/26/2016 1055   PLT 257 10/14/2018 2003   PLT 298 06/26/2016 1055   MCV 84.8 10/14/2018 2003   MCV 83 06/26/2016 1055   MCH 27.2 10/14/2018 2003    MCHC 32.0 10/14/2018 2003   RDW 14.1 10/14/2018 2003   RDW 14.9 06/26/2016 1055   LYMPHSABS 1.7 06/26/2016 1055   MONOABS 0.4 05/11/2014 1500   EOSABS 0.2 06/26/2016 1055   BASOSABS 0.0 06/26/2016 1055    No results found for: POCLITH, LITHIUM   No results found for: PHENYTOIN, PHENOBARB, VALPROATE, CBMZ   .res Assessment: Plan:    Bipolar 1 disorder, manic, full remission ([place])  Very med sensitive so gets by with low dosages.  Diabetes resolved with weight loss.  Cynthia Cameron has been under my psychiatric care since October 1998 when she was diagnosed with bipolar disorder mania with mild mood congruent psychotic features.  She responded very well very quickly to very low-dose Zyprexa 2.5 mg nightly plus gabapentin.  Gabapentin was weaned without complications later on she had some mild depression which remitted with very low-dose Wellbutrin 50 to 100 mg nightly.  She is been stable for several years since then.  She has a history of hypomania when olanzapine was reduced to 1.25 mg daily.  Discussed potential metabolic side effects associated with atypical antipsychotics, as well as potential risk for movement side effects. Advised pt to contact office if movement side effects occur.   No med changes indicated.  Taking 1/2 of 5 mg olanzapine and 1/2 of Wellbutrin  Has been very stable.  FU 1 year  Lynder Parents, MD, DFAPA   Please see After Visit Summary for patient specific instructions.  No future appointments.  No orders of the defined types were placed in this encounter.     -------------------------------

## 2019-10-10 ENCOUNTER — Other Ambulatory Visit: Payer: Self-pay | Admitting: Psychiatry

## 2019-10-10 DIAGNOSIS — F3174 Bipolar disorder, in full remission, most recent episode manic: Secondary | ICD-10-CM

## 2020-01-20 ENCOUNTER — Other Ambulatory Visit: Payer: Self-pay | Admitting: Obstetrics and Gynecology

## 2020-01-20 DIAGNOSIS — Z1231 Encounter for screening mammogram for malignant neoplasm of breast: Secondary | ICD-10-CM

## 2020-02-17 ENCOUNTER — Ambulatory Visit
Admission: RE | Admit: 2020-02-17 | Discharge: 2020-02-17 | Disposition: A | Discharge status: Home / Self Care | Payer: 59 | Source: Ambulatory Visit | Attending: Obstetrics and Gynecology | Admitting: Obstetrics and Gynecology

## 2020-02-17 ENCOUNTER — Other Ambulatory Visit: Payer: Self-pay

## 2020-02-17 DIAGNOSIS — Z1231 Encounter for screening mammogram for malignant neoplasm of breast: Secondary | ICD-10-CM

## 2020-03-16 ENCOUNTER — Other Ambulatory Visit: Payer: Self-pay | Admitting: Psychiatry

## 2020-03-16 DIAGNOSIS — F3174 Bipolar disorder, in full remission, most recent episode manic: Secondary | ICD-10-CM

## 2020-08-03 ENCOUNTER — Other Ambulatory Visit: Payer: Self-pay | Admitting: Psychiatry

## 2020-08-03 DIAGNOSIS — F3174 Bipolar disorder, in full remission, most recent episode manic: Secondary | ICD-10-CM

## 2020-08-05 ENCOUNTER — Other Ambulatory Visit: Payer: Self-pay

## 2020-08-05 DIAGNOSIS — F3174 Bipolar disorder, in full remission, most recent episode manic: Secondary | ICD-10-CM

## 2020-08-05 MED ORDER — OLANZAPINE 5 MG PO TABS: ORAL_TABLET | ORAL | 3 refills | Status: DC

## 2020-09-09 ENCOUNTER — Other Ambulatory Visit: Payer: Self-pay

## 2020-09-09 ENCOUNTER — Encounter: Payer: Self-pay | Admitting: Psychiatry

## 2020-09-09 ENCOUNTER — Ambulatory Visit (INDEPENDENT_AMBULATORY_CARE_PROVIDER_SITE_OTHER): Payer: 59 | Admitting: Psychiatry

## 2020-09-09 DIAGNOSIS — F3174 Bipolar disorder, in full remission, most recent episode manic: Secondary | ICD-10-CM | POA: Diagnosis not present

## 2020-09-09 MED ORDER — BUPROPION HCL ER (SR) 100 MG PO TB12: 100.0000 mg | ORAL_TABLET | Freq: Two times a day (BID) | ORAL | 3 refills | Status: DC

## 2020-09-09 MED ORDER — LYBALVI 5-10 MG PO TABS: 1.0000 | ORAL_TABLET | Freq: Every evening | ORAL | 1 refills | Status: DC

## 2020-09-09 NOTE — Progress Notes (Signed)
Cynthia Cameron [id number] Jun 08, 1961 60 y.o.  Subjective:   Patient ID:  Cynthia Cameron is a 60 y.o. (DOB Sep 16, 1960) female.  Chief Complaint:  Chief Complaint  Patient presents with  . Follow-up    HPI   Cynthia Cameron presents to the office today for follow-up of bipolar disorder 1.  09/09/20 appt noted:  Taking 1/2 of 5 mg olanzapine and 1/2 of Wellbutrin 100 mg tablets.  No Covid.   Mood has remained good.  No periods of confusion. No fear.  Patient reports stable mood and denies depressed or irritable moods.  No mood swings.  No episodes paranoia, fear.  Patient denies any recent difficulty with anxiety.  Patient denies difficulty with sleep initiation or maintenance. 6-7 hours sleep with schedule plus nap. To work 5 AM.  Denies appetite disturbance.  Patient reports that energy and motivation have been good.  Patient denies any difficulty with concentration.  Patient denies any suicidal ideation.  Not drowsy.  Dropped 15-20# with diet change.  H retired from [employer] early. Son grad Industrial/product designer.  Past Psychiatric Medication Trials: Gabapentin, olanzapine, Wellbutrin SR 100 Under our psychiatric care for bipolar disorder type I since November 1998 1 hosp with manic psychosis with confusion.  Review of Systems:  Review of Systems  Cardiovascular: Negative for palpitations.  Musculoskeletal: Positive for arthralgias.  Neurological: Negative for tremors and weakness.  Psychiatric/Behavioral: Negative for agitation, behavioral problems, confusion, decreased concentration, dysphoric mood, hallucinations, self-injury, sleep disturbance and suicidal ideas. The patient is not nervous/anxious and is not hyperactive.     Medications: I have reviewed the patient's current medications.  Current Outpatient Medications  Medication Sig Dispense Refill  . amLODipine (NORVASC) 5 MG tablet TAKE 1 TABLET BY MOUTH EVERY DAY 30 tablet 4  . cloNIDine (CATAPRES)  0.2 MG tablet Take 0.2 mg by mouth at bedtime.    . furosemide (LASIX) 40 MG tablet Take 1 tablet (40 mg total) by mouth daily. 30 tablet 1  . glucose blood (FREESTYLE LITE) test strip To check blood sugars tid dx code 250.00 100 each 3  . Insulin Syringes, Disposable, U-100 0.5 ML MISC 3-20 Units by Does not apply route 4 (four) times daily -  before meals and at bedtime. 120 each 1  . KLOR-CON 10 10 MEQ tablet Take 2 tablets by mouth 2 (two) times daily.    Marland Kitchen lisinopril (PRINIVIL,ZESTRIL) 20 MG tablet Take 1 tablet (20 mg total) by mouth daily. PATIENT NEEDS OFFICE VISIT FOR ADDITIONAL REFILLS 30 tablet 0  . Multiple Vitamin (MULTIVITAMIN WITH MINERALS) TABS Take 1 tablet by mouth daily.    . nebivolol (BYSTOLIC) 10 MG tablet Take 5 mg by mouth daily.     Marland Kitchen OLANZapine (ZYPREXA) 5 MG tablet TAKE 1 TABLET BY MOUTH EVERYDAY AT BEDTIME (Patient taking differently: Take 2.5 mg by mouth at bedtime.) 90 tablet 3  . OLANZapine-Samidorphan ([name]) 5-10 MG TABS Take 1 tablet by mouth at bedtime. 30 tablet 1  . XARELTO 20 MG TABS tablet TAKE 1 TABLET BY MOUTH ONCE DAILY WITH SUPPER. 30 tablet 2  . buPROPion (WELLBUTRIN SR) 100 MG 12 hr tablet Take 1 tablet (100 mg total) by mouth 2 (two) times daily. 180 tablet 3   No current facility-administered medications for this visit.    Medication Side Effects: None  Allergies: No Known Allergies  Past Medical History:  Diagnosis Date  . Allergy   . Bipolar depression ([place])   . Chest pain    "  since 09/2012; hurts all the time" (12/24/2012)  . Depression   . DVT, lower extremity ([place]) 10/2012   "2" (12/24/2012)  . High cholesterol   . Hypertension   . Iron deficiency anemia   . Pneumonia 09/2012  . Pulmonary embolism on right ([place]) 09/2012  . Shortness of breath   . Type II diabetes mellitus ([facility])     Family History  Problem Relation Age of Onset  . Diabetes Mother   . Atrial fibrillation Mother   . Hypertension Father   . Breast cancer Neg Hx      Social History   Socioeconomic History  . Marital status: Married    Spouse name: Not on file  . Number of children: Not on file  . Years of education: Not on file  . Highest education level: Not on file  Occupational History  . Not on file  Tobacco Use  . Smoking status: Never Smoker  . Smokeless tobacco: Never Used  Substance and Sexual Activity  . Alcohol use: Yes    Alcohol/week: 0.0 standard drinks    Comment: Rarely.  . Drug use: No  . Sexual activity: Not on file  Other Topics Concern  . Not on file  Social History Narrative   LIves in [place]. 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    Social Determinants of Health   Financial Resource Strain: Not on file  Food Insecurity: Not on file  Transportation Needs: Not on file  Physical Activity: Not on file  Stress: Not on file  Social Connections: Not on file  Intimate Partner Violence: Not on file    Past Medical History, Surgical history, Social history, and Family history were reviewed and updated as appropriate.   Please see review of systems for further details on the patient's review from today.   Objective:   Physical Exam:  LMP 09/16/2010   Physical Exam Constitutional:      General: She is not in acute distress.    Appearance: She is well-developed.  Musculoskeletal:        General: No deformity.  Neurological:     Mental Status: She is alert and oriented to person, place, and time.     Motor: No tremor.     Coordination: Coordination normal.     Gait: Gait normal.  Psychiatric:        Attention and Perception: Attention and perception normal.        Mood and Affect: Mood is not anxious or depressed. Affect is not labile, blunt, angry or inappropriate.        Speech: Speech normal. Speech is not rapid and pressured.        Behavior: Behavior normal.        Thought Content: Thought content normal. Thought content does not include homicidal or suicidal ideation. Thought  content does not include homicidal or suicidal plan.        Cognition and Memory: Cognition normal.        Judgment: Judgment normal.     Comments: Insight intact. No auditory or visual hallucinations. No delusions.      Lab Review:     Component Value Date/Time   NA 138 10/14/2018 2003   K 2.9 (L) 10/14/2018 2003   CL 103 10/14/2018 2003   CO2 25 10/14/2018 2003   GLUCOSE 94 10/14/2018 2003   BUN 38 (H) 10/14/2018 2003   CREATININE 1.53 (H) 10/14/2018 2003   CREATININE 1.48 (H) 09/24/2013 1736  CALCIUM 9.4 10/14/2018 2003   PROT 7.7 09/24/2013 1736   ALBUMIN 4.3 09/24/2013 1736   AST 17 09/24/2013 1736   ALT 16 09/24/2013 1736   ALKPHOS 103 09/24/2013 1736   BILITOT 0.4 09/24/2013 1736   GFRNONAA 37 (L) 10/14/2018 2003   GFRNONAA 40 (L) 01/29/2012 1616   GFRAA 43 (L) 10/14/2018 2003   GFRAA 46 (L) 01/29/2012 1616       Component Value Date/Time   WBC 7.3 10/14/2018 2003   RBC 4.27 10/14/2018 2003   HGB 11.6 (L) 10/14/2018 2003   HGB 12.8 06/26/2016 1055   HCT 36.2 10/14/2018 2003   HCT 38.3 06/26/2016 1055   PLT 257 10/14/2018 2003   PLT 298 06/26/2016 1055   MCV 84.8 10/14/2018 2003   MCV 83 06/26/2016 1055   MCH 27.2 10/14/2018 2003   MCHC 32.0 10/14/2018 2003   RDW 14.1 10/14/2018 2003   RDW 14.9 06/26/2016 1055   LYMPHSABS 1.7 06/26/2016 1055   MONOABS 0.4 05/11/2014 1500   EOSABS 0.2 06/26/2016 1055   BASOSABS 0.0 06/26/2016 1055    No results found for: POCLITH, LITHIUM   No results found for: PHENYTOIN, PHENOBARB, VALPROATE, CBMZ   .res Assessment: Plan:    Bipolar 1 disorder, manic, full remission ([place]) - Plan: OLANZapine-Samidorphan ([name]) 5-10 MG TABS, buPROPion (WELLBUTRIN SR) 100 MG 12 hr tablet   Very med sensitive so gets by with low dosages.  Diabetes resolved with weight loss.  Cynthia Cameron has been under my psychiatric care since October 1998 when she was diagnosed with bipolar disorder mania with mild mood congruent psychotic  features.  She responded very well very quickly to very low-dose Zyprexa 2.5 mg nightly plus gabapentin.  Gabapentin was weaned without complications later on she had some mild depression which remitted with very low-dose Wellbutrin 50 to 100 mg nightly.  She is been stable for several years since then.  She has a history of hypomania when olanzapine was reduced to 1.25 mg daily.  Discussed potential metabolic side effects associated with atypical antipsychotics, as well as potential risk for movement side effects. Advised pt to contact office if movement side effects occur.   Disc option of new med. Yes trial of [name] to see if weight loss. No med changes indicated.  Taking 1/2 of 5 mg olanzapine and 1/2 of Wellbutrin  Has been very stable.  FU 1 year  Cynthia Parents, MD, DFAPA   Please see After Visit Summary for patient specific instructions.  No future appointments.  No orders of the defined types were placed in this encounter.     -------------------------------

## 2020-09-23 ENCOUNTER — Telehealth: Payer: Self-pay | Admitting: Psychiatry

## 2020-09-23 NOTE — Telephone Encounter (Signed)
Next appt is 09/08/21. [facility] doesn't cover her [name]. Per the pharmacist, she will need a [facility] prescription[facility] [address] [facility] [place] Phone # is [phone]

## 2020-09-23 NOTE — Telephone Encounter (Signed)
Please call the rep Sonia Baller.  My understanding is that if the PA is denied then the discount card will still work.  Please ask her about this.  If so have her contact the pharmacy to get this fixed.

## 2020-09-23 NOTE — Telephone Encounter (Signed)
I received the fax concerning this, and will be submitting a prior authorization through her insurance.

## 2020-09-23 NOTE — Telephone Encounter (Signed)
Prior authorization submitted for [name] to [facility], responded back with a denial stating this is not a covered benefit on her plan.   Will follow up with Dr. Clovis Pu on how to proceed.        [facility] ID# [id number] rx bin, [id number] rx pcn [id number] rx grp Samson Frederic

## 2020-09-28 ENCOUNTER — Telehealth: Payer: Self-pay | Admitting: Psychiatry

## 2020-09-28 NOTE — Telephone Encounter (Signed)
Vetta called you back. Her number is [phone].

## 2020-09-29 NOTE — Telephone Encounter (Signed)
Left patient vm to call back to discuss her [name]

## 2020-09-30 ENCOUNTER — Telehealth: Payer: Self-pay | Admitting: Psychiatry

## 2020-09-30 NOTE — Telephone Encounter (Signed)
I called Cynthia Cameron and LM on both her cell phone and home phone to take her coupon for her new medication to the pharmacy.  With the coupon the cost will be $0 cost to her.  It is important she try this medication.  Again take the coupon to the pharmacy.

## 2020-10-10 ENCOUNTER — Other Ambulatory Visit: Payer: Self-pay | Admitting: Psychiatry

## 2020-10-10 DIAGNOSIS — F3174 Bipolar disorder, in full remission, most recent episode manic: Secondary | ICD-10-CM

## 2020-10-15 ENCOUNTER — Other Ambulatory Visit: Payer: Self-pay | Admitting: Nephrology

## 2020-10-15 DIAGNOSIS — N1831 Chronic kidney disease, stage 3a: Secondary | ICD-10-CM

## 2020-10-22 ENCOUNTER — Telehealth: Payer: Self-pay

## 2020-10-22 NOTE — Telephone Encounter (Signed)
Prior authorization was denied for [facility] 5-10 on 09/23/20 with [facility]. Staff has tried to reach patient multiple times since then to explain she still needs to take the co-pay/discount card that Dr. Clovis Pu gave her at the office visit with samples and she should still be able to get that medication. Multiple messages left and no call back.   Will close out trying to reach patient at this time.

## 2020-10-28 ENCOUNTER — Ambulatory Visit
Admission: RE | Admit: 2020-10-28 | Discharge: 2020-10-28 | Disposition: A | Discharge status: Home / Self Care | Payer: 59 | Source: Ambulatory Visit | Attending: Nephrology | Admitting: Nephrology

## 2020-10-28 DIAGNOSIS — N1831 Chronic kidney disease, stage 3a: Secondary | ICD-10-CM

## 2021-01-27 ENCOUNTER — Other Ambulatory Visit: Payer: Self-pay | Admitting: Family Medicine

## 2021-01-27 DIAGNOSIS — Z1231 Encounter for screening mammogram for malignant neoplasm of breast: Secondary | ICD-10-CM

## 2021-01-30 ENCOUNTER — Other Ambulatory Visit: Payer: Self-pay | Admitting: Psychiatry

## 2021-01-30 DIAGNOSIS — F3174 Bipolar disorder, in full remission, most recent episode manic: Secondary | ICD-10-CM

## 2021-03-24 ENCOUNTER — Ambulatory Visit: Payer: 59

## 2021-03-25 ENCOUNTER — Ambulatory Visit
Admission: RE | Admit: 2021-03-25 | Discharge: 2021-03-25 | Disposition: A | Discharge status: Home / Self Care | Payer: 59 | Source: Ambulatory Visit | Attending: Family Medicine | Admitting: Family Medicine

## 2021-03-25 ENCOUNTER — Other Ambulatory Visit: Payer: Self-pay

## 2021-03-25 DIAGNOSIS — Z1231 Encounter for screening mammogram for malignant neoplasm of breast: Secondary | ICD-10-CM

## 2021-03-30 ENCOUNTER — Telehealth: Payer: Self-pay | Admitting: Psychiatry

## 2021-03-30 ENCOUNTER — Other Ambulatory Visit: Payer: Self-pay | Admitting: Family Medicine

## 2021-03-30 DIAGNOSIS — R928 Other abnormal and inconclusive findings on diagnostic imaging of breast: Secondary | ICD-10-CM

## 2021-03-30 NOTE — Telephone Encounter (Signed)
Cynthia Cameron, read message from Feb about her [name] prior authorization. Has she been taking it?

## 2021-03-30 NOTE — Telephone Encounter (Signed)
Please review

## 2021-03-30 NOTE — Telephone Encounter (Signed)
Cynthia Cameron states that [name] needs a PA and she can't pick it up. She is not sure if she should continue to take olanzepine until it is approved. Do we know if it needs a PA? [name]

## 2021-03-31 NOTE — Telephone Encounter (Signed)
LVM for pt to return call

## 2021-04-04 ENCOUNTER — Other Ambulatory Visit: Payer: Self-pay

## 2021-04-04 ENCOUNTER — Telehealth: Payer: Self-pay | Admitting: Psychiatry

## 2021-04-04 DIAGNOSIS — F3174 Bipolar disorder, in full remission, most recent episode manic: Secondary | ICD-10-CM

## 2021-04-04 MED ORDER — LYBALVI 5-10 MG PO TABS: 5.0000 mg | ORAL_TABLET | Freq: Every day | ORAL | 4 refills | Status: DC

## 2021-04-04 NOTE — Telephone Encounter (Signed)
Cynthia Cameron called to request refill of her [name].  It is working great and she wants to continue with this medication.  [facility] [address]  Appt 09/08/21

## 2021-04-04 NOTE — Telephone Encounter (Signed)
Pt's refill is sent and she needs to remember to take her co-pay to pharmacy if she hasn't already.

## 2021-04-08 ENCOUNTER — Other Ambulatory Visit: Payer: Self-pay | Admitting: Psychiatry

## 2021-04-08 DIAGNOSIS — F3174 Bipolar disorder, in full remission, most recent episode manic: Secondary | ICD-10-CM

## 2021-04-11 ENCOUNTER — Telehealth: Payer: Self-pay

## 2021-04-11 NOTE — Telephone Encounter (Signed)
Prior Authorization submitted for [name] 5-10 mg and received a DENIAL,   The requested medication and/or diagnosis are not a covered benefit and excluded from coverage in accordance with the terms and conditions of the plan benefit. Therefore, the request has been administratively denied. The requested medication and/or diagnosis are not a covered benefit and are excluded from coverage in accordance with the terms and conditions of your plan benefit. Therefore, this request has been administratively denied.  I contacted [facility] and they suggest the pt contact her HR dept or whomever would handle her insurance coverage and see if it can get added.    Tried to reach pt but had to leave a message.

## 2021-04-12 ENCOUNTER — Ambulatory Visit (INDEPENDENT_AMBULATORY_CARE_PROVIDER_SITE_OTHER): Payer: 59 | Admitting: Podiatry

## 2021-04-12 ENCOUNTER — Other Ambulatory Visit: Payer: Self-pay

## 2021-04-12 ENCOUNTER — Encounter: Payer: Self-pay | Admitting: Podiatry

## 2021-04-12 ENCOUNTER — Other Ambulatory Visit: Payer: Self-pay | Admitting: Podiatry

## 2021-04-12 ENCOUNTER — Ambulatory Visit (INDEPENDENT_AMBULATORY_CARE_PROVIDER_SITE_OTHER): Payer: 59

## 2021-04-12 DIAGNOSIS — N1832 Chronic kidney disease, stage 3b: Secondary | ICD-10-CM | POA: Insufficient documentation

## 2021-04-12 DIAGNOSIS — M7751 Other enthesopathy of right foot: Secondary | ICD-10-CM | POA: Diagnosis not present

## 2021-04-12 DIAGNOSIS — Z6831 Body mass index (BMI) 31.0-31.9, adult: Secondary | ICD-10-CM | POA: Insufficient documentation

## 2021-04-12 DIAGNOSIS — R899 Unspecified abnormal finding in specimens from other organs, systems and tissues: Secondary | ICD-10-CM | POA: Insufficient documentation

## 2021-04-12 DIAGNOSIS — F3161 Bipolar disorder, current episode mixed, mild: Secondary | ICD-10-CM | POA: Insufficient documentation

## 2021-04-12 DIAGNOSIS — D2371 Other benign neoplasm of skin of right lower limb, including hip: Secondary | ICD-10-CM

## 2021-04-12 DIAGNOSIS — M21621 Bunionette of right foot: Secondary | ICD-10-CM

## 2021-04-12 DIAGNOSIS — M2041 Other hammer toe(s) (acquired), right foot: Secondary | ICD-10-CM

## 2021-04-12 DIAGNOSIS — Z86711 Personal history of pulmonary embolism: Secondary | ICD-10-CM | POA: Insufficient documentation

## 2021-04-12 DIAGNOSIS — M1990 Unspecified osteoarthritis, unspecified site: Secondary | ICD-10-CM | POA: Insufficient documentation

## 2021-04-12 DIAGNOSIS — E785 Hyperlipidemia, unspecified: Secondary | ICD-10-CM | POA: Insufficient documentation

## 2021-04-12 DIAGNOSIS — L84 Corns and callosities: Secondary | ICD-10-CM | POA: Diagnosis not present

## 2021-04-12 DIAGNOSIS — I10 Essential (primary) hypertension: Secondary | ICD-10-CM | POA: Insufficient documentation

## 2021-04-12 DIAGNOSIS — E1121 Type 2 diabetes mellitus with diabetic nephropathy: Secondary | ICD-10-CM | POA: Insufficient documentation

## 2021-04-12 DIAGNOSIS — I129 Hypertensive chronic kidney disease with stage 1 through stage 4 chronic kidney disease, or unspecified chronic kidney disease: Secondary | ICD-10-CM | POA: Insufficient documentation

## 2021-04-12 DIAGNOSIS — M109 Gout, unspecified: Secondary | ICD-10-CM | POA: Insufficient documentation

## 2021-04-12 MED ORDER — DEXAMETHASONE SODIUM PHOSPHATE 120 MG/30ML IJ SOLN
2.0000 mg | Freq: Once | INTRAMUSCULAR | Status: AC
  Administered 2021-04-12: 2 mg via INTRA_ARTICULAR

## 2021-04-12 NOTE — Telephone Encounter (Signed)
Reba returned Traci's call.  I told her that the PA had been denied because it is not a covered benefit on her insurance plan.  I told her she would need to appeal to the HR department or whomever administers the insurance plan and ask them to add [name] as a covered medication.  The plan is through her husbands insurance so she said she would have him do this.  I told her to let us know the results of the request so Dr. Clovis Pu would know what direction to go with her medication.  She acknowledged the information and would let us know what happens.

## 2021-04-12 NOTE — Telephone Encounter (Signed)
noted 

## 2021-04-13 NOTE — Progress Notes (Signed)
Subjective:  Patient ID: CHAUNDA WASSINK, female    DOB: 23-Sep-1960,  MRN: [id number] HPI Chief Complaint  Patient presents with   Toe Pain    5th toe right - callused area x 1 year, shoes uncomfortable, tried medicated corn pad - no help   New Patient (Initial Visit)    60 y.o. female presents with the above complaint.   ROS: Denies fever chills nausea vomiting muscle aches pains calf pain back pain chest pain shortness of breath.  Past Medical History:  Diagnosis Date   Allergy    Bipolar depression ([place])    Chest pain    "since 09/2012; hurts all the time" (12/24/2012)   Depression    DVT, lower extremity ([place]) 10/2012   "2" (12/24/2012)   High cholesterol    Hypertension    Iron deficiency anemia    Pneumonia 09/2012   Pulmonary embolism on right ([place]) 09/2012   Shortness of breath    Type II diabetes mellitus ([place])    Past Surgical History:  Procedure Laterality Date   CARDIAC CATHETERIZATION  09/2012   CESAREAN SECTION  1994; [place] ANGIOGRAM N/A 10/25/2012   Procedure: LEFT HEART CATHETERIZATION WITH CORONARY ANGIOGRAM;  Surgeon: Birdie Riddle, MD;  Location: [facility] CATH LAB;  Service: Cardiovascular;  Laterality: N/A;    Current Outpatient Medications:    allopurinol (ZYLOPRIM) 100 MG tablet, Take 100 mg by mouth daily., Disp: , Rfl:    amLODipine (NORVASC) 10 MG tablet, Take 10 mg by mouth daily., Disp: , Rfl:    atorvastatin (LIPITOR) 20 MG tablet, Take 20 mg by mouth daily., Disp: , Rfl:    bisoprolol (ZEBETA) 10 MG tablet, Take by mouth., Disp: , Rfl:    buPROPion (WELLBUTRIN SR) 100 MG 12 hr tablet, TAKE 1 TABLET BY MOUTH  TWICE DAILY, Disp: 180 tablet, Rfl: 3   cloNIDine (CATAPRES) 0.2 MG tablet, Take 0.2 mg by mouth at bedtime., Disp: , Rfl:    furosemide (LASIX) 40 MG tablet, Take 1 tablet (40 mg total) by mouth daily., Disp: 30 tablet, Rfl: 1   glucose blood (FREESTYLE LITE) test strip, To check blood sugars tid dx  code 250.00, Disp: 100 each, Rfl: 3   Insulin Syringes, Disposable, U-100 0.5 ML MISC, 3-20 Units by Does not apply route 4 (four) times daily -  before meals and at bedtime., Disp: 120 each, Rfl: 1   levocetirizine (XYZAL) 5 MG tablet, Take 5 mg by mouth at bedtime., Disp: , Rfl:    Multiple Vitamin (MULTIVITAMIN WITH MINERALS) TABS, Take 1 tablet by mouth daily., Disp: , Rfl:    OLANZapine (ZYPREXA) 5 MG tablet, TAKE 1 TABLET BY MOUTH EVERYDAY AT BEDTIME, Disp: 30 tablet, Rfl: 1   OLANZapine-Samidorphan ([name]) 5-10 MG TABS, Take 5-10 mg by mouth at bedtime., Disp: 30 tablet, Rfl: 4   potassium chloride SA (KLOR-CON) 20 MEQ tablet, Take 20 mEq by mouth daily., Disp: , Rfl:    spironolactone (ALDACTONE) 25 MG tablet, Take 25 mg by mouth daily., Disp: , Rfl:    XARELTO 20 MG TABS tablet, TAKE 1 TABLET BY MOUTH ONCE DAILY WITH SUPPER., Disp: 30 tablet, Rfl: 2  No Known Allergies Review of Systems Objective:  There were no vitals filed for this visit.  General: Well developed, nourished, in no acute distress, alert and oriented x3   Dermatological: Skin is warm, dry and supple bilateral. Nails x 10 are well maintained; remaining integument appears unremarkable  at this time. There are no open sores, no preulcerative lesions, no rash or signs of infection present.  Benign skin lesion dorsal lateral aspect fifth toe right.  Vascular: Dorsalis Pedis artery and Posterior Tibial artery pedal pulses are 2/4 bilateral with immedate capillary fill time. Pedal hair growth present. No varicosities and no lower extremity edema present bilateral.   Neruologic: Grossly intact via light touch bilateral. Vibratory intact via tuning fork bilateral. Protective threshold with Semmes Wienstein monofilament intact to all pedal sites bilateral. Patellar and Achilles deep tendon reflexes 2+ bilateral. No Babinski or clonus noted bilateral.   Musculoskeletal: No gross boney pedal deformities bilateral. No pain,  crepitus, or limitation noted with foot and ankle range of motion bilateral. Muscular strength 5/5 in all groups tested bilateral.  Pain on palpation and attempted range of motion of the fifth digit of the right foot which appears to be arthritic at the level of the PIPJ.  Gait: Unassisted, Nonantalgic.    Radiographs:  Radiographs taken today demonstrate osseously mature individual with adductovarus rotated hammertoe deformity fifth right with a lateral spur mid diaphyseal region of the fifth proximal phalanx.  Otherwise no significant acute findings.  Assessment & Plan:   Assessment: Adductovarus rotated hammertoe deformity with a lateral exostosis of the proximal phalanx fifth digit right foot resulting in a painful benign skin lesion.  Plan: Debrided painful benign skin lesion today I injected the bursa and the joint today with 2 mg of dexamethasone and local anesthetic also consented her for derotational arthroplasty with a lateral exostectomy she understands this and is amendable to it we did discuss the possible postop complications which may include but are not limited to postop pain bleeding swelling infection recurrence need for further surgery overcorrection under correction also digit loss of limb loss of life.  Dispensed information regarding the surgery center [facility].  Should she have questions or concerns she will notify us immediately.     Micala Saltsman T. [place], [place]

## 2021-04-19 NOTE — Telephone Encounter (Signed)
Pt called again, left message on v-mail stating need PA for [name] for [facility]. Please contact Pt @ [phone]. Next apt 09/08/21 w/CC

## 2021-04-23 ENCOUNTER — Other Ambulatory Visit: Payer: Self-pay | Admitting: Psychiatry

## 2021-04-23 DIAGNOSIS — F3174 Bipolar disorder, in full remission, most recent episode manic: Secondary | ICD-10-CM

## 2021-04-26 ENCOUNTER — Ambulatory Visit: Payer: 59

## 2021-04-26 ENCOUNTER — Other Ambulatory Visit: Payer: Self-pay

## 2021-04-26 ENCOUNTER — Ambulatory Visit
Admission: RE | Admit: 2021-04-26 | Discharge: 2021-04-26 | Disposition: A | Discharge status: Home / Self Care | Payer: 59 | Source: Ambulatory Visit | Attending: Family Medicine | Admitting: Family Medicine

## 2021-04-26 DIAGNOSIS — R928 Other abnormal and inconclusive findings on diagnostic imaging of breast: Secondary | ICD-10-CM

## 2021-04-26 NOTE — Telephone Encounter (Signed)
Not sure if pt is not understanding, Denial already received. This medication is not part of her plan. She was advised to contact her husbands insurance to get added. If she is calling back to say it was we can resubmit.

## 2021-04-27 NOTE — Telephone Encounter (Signed)
Looks like she is on her husbands insurance. He's the subscriber and she's the dependent. What's in system [employer]. Do I need to call her back? [phone] contact #

## 2021-05-06 ENCOUNTER — Telehealth: Payer: Self-pay | Admitting: Urology

## 2021-05-06 NOTE — Telephone Encounter (Signed)
DOS - 05/27/21  HAMMERTOE REPAIR 5TH RIGHT --- [id number] EXOSTECTOMY 5TH RIGHT --- [id number]  [facility] EFFECTIVE DATE - 08/28/20  PLAN DEDUCTIBLE - $0.00 OUT OF POCKET - $5,000.00 W/ [id number] REMAINING COINSURANCE  - 20% COPAY - $150.00   SPOKE WITH RYAN WITH [employer] AND HE STATED FOR CPT CODE 65784 NO AUTH IS REQUIRED. FOR CPT CODE 69629 AUTH IS REQUIRED, PENDING AUTH # [id number], THAT AUTH # GOT CXLED DUE TO IT BEING PUT IN [place], NEW AUTH # [id number] WAS APPROVED.  REF # [id number]

## 2021-05-09 ENCOUNTER — Telehealth: Payer: Self-pay | Admitting: *Deleted

## 2021-05-09 NOTE — Telephone Encounter (Signed)
Patient is calling because she is having surgery 05/27/21,when should she discontinue her blood thinner(Xarelto)?

## 2021-05-11 NOTE — Telephone Encounter (Signed)
Tell the patient to discontinue the Xarelto 24 hours prior to surgery.-Dr. Amalia Hailey

## 2021-05-25 ENCOUNTER — Other Ambulatory Visit: Payer: Self-pay | Admitting: Podiatry

## 2021-05-25 MED ORDER — CEPHALEXIN 500 MG PO CAPS: 500.0000 mg | ORAL_CAPSULE | Freq: Three times a day (TID) | ORAL | 0 refills | Status: DC

## 2021-05-25 MED ORDER — ONDANSETRON HCL 4 MG PO TABS: 4.0000 mg | ORAL_TABLET | Freq: Three times a day (TID) | ORAL | 0 refills | Status: AC | PRN

## 2021-05-25 MED ORDER — OXYCODONE-ACETAMINOPHEN 10-325 MG PO TABS: 1.0000 | ORAL_TABLET | Freq: Three times a day (TID) | ORAL | 0 refills | Status: AC | PRN

## 2021-05-27 DIAGNOSIS — M2041 Other hammer toe(s) (acquired), right foot: Secondary | ICD-10-CM | POA: Diagnosis not present

## 2021-05-27 DIAGNOSIS — M7751 Other enthesopathy of right foot: Secondary | ICD-10-CM | POA: Diagnosis not present

## 2021-06-02 ENCOUNTER — Other Ambulatory Visit: Payer: Self-pay

## 2021-06-02 ENCOUNTER — Ambulatory Visit (INDEPENDENT_AMBULATORY_CARE_PROVIDER_SITE_OTHER): Payer: 59

## 2021-06-02 ENCOUNTER — Ambulatory Visit (INDEPENDENT_AMBULATORY_CARE_PROVIDER_SITE_OTHER): Payer: 59 | Admitting: Podiatry

## 2021-06-02 DIAGNOSIS — M2041 Other hammer toe(s) (acquired), right foot: Secondary | ICD-10-CM

## 2021-06-05 NOTE — Progress Notes (Signed)
  Subjective:  Patient ID: Cynthia Cameron, female    DOB: June 20, 1961,  MRN: [id number]  Chief Complaint  Patient presents with   Routine Post Op     (xray)POV #1 DOS 05/27/2021 DEROTATIONAL ARTHROPLASTY 5TH TOE, EXOSTECTOMY 5TH TOE/DR HYATT PT    60 y.o. female returns for post-op check.   Review of Systems: Negative except as noted in the HPI. Denies N/V/F/Ch.   Objective:  There were no vitals filed for this visit. There is no height or weight on file to calculate BMI. Constitutional Well developed. Well nourished.  Vascular Foot warm and well perfused. Capillary refill normal to all digits.   Neurologic Normal speech. Oriented to person, place, and time. Epicritic sensation to light touch grossly present bilaterally.  Dermatologic Skin healing well without signs of infection. Skin edges well coapted without signs of infection.  Orthopedic: Tenderness to palpation noted about the surgical site.   Multiple view plain film radiographs: Status post fifth toe arthroplasty Assessment:   1. Hammer toe of right foot    Plan:  Patient was evaluated and treated and all questions answered.  S/p foot surgery right -Progressing as expected post-operatively. -XR: As noted above no issues -WB Status: WBAT in surgical shoe -Sutures: We will remove at next visit. -Released her to begin bathing no soaking or scrubbing the incision  Return in about 1 week (around 06/09/2021) for post op (no x-rays), suture removal.

## 2021-06-09 ENCOUNTER — Ambulatory Visit: Payer: 59 | Admitting: Podiatry

## 2021-06-09 ENCOUNTER — Other Ambulatory Visit: Payer: Self-pay

## 2021-06-09 DIAGNOSIS — M2041 Other hammer toe(s) (acquired), right foot: Secondary | ICD-10-CM

## 2021-06-09 NOTE — Progress Notes (Signed)
  Subjective:  Patient ID: Cynthia Cameron, female    DOB: December 04, 1960,  MRN: [id number]  Chief Complaint  Patient presents with   Routine Post Op     POV #2 DOS 05/27/2021 DEROTATIONAL ARTHROPLASTY 5TH TOE, EXOSTECTOMY 5TH TOE/DR HYATT PT    60 y.o. female returns for post-op check.   Review of Systems: Negative except as noted in the HPI. Denies N/V/F/Ch.   Objective:  There were no vitals filed for this visit. There is no height or weight on file to calculate BMI. Constitutional Well developed. Well nourished.  Vascular Foot warm and well perfused. Capillary refill normal to all digits.   Neurologic Normal speech. Oriented to person, place, and time. Epicritic sensation to light touch grossly present bilaterally.  Dermatologic Skin healing well without signs of infection. Skin edges well coapted without signs of infection.  Orthopedic: Tenderness to palpation noted about the surgical site.   Multiple view plain film radiographs: Status post fifth toe arthroplasty Assessment:   1. Hammer toe of right foot    Plan:  Patient was evaluated and treated and all questions answered.  S/p foot surgery right -Sutures removed today she can resume regular shoe gear when tolerated and she will see Dr. Milinda Pointer in 2 weeks  No follow-ups on file.

## 2021-06-14 ENCOUNTER — Telehealth: Payer: Self-pay | Admitting: Podiatry

## 2021-06-14 NOTE — Telephone Encounter (Signed)
Patient called and lvm on general mailbox stated that she has insurance with [employer], they wanted to know when Dr. Milinda Pointer is going to release her to go back to work,.  She also wanted Dr. Milinda Pointer to know that a couple of her stitches have came apart and she thinks the incision is opening up.  Please Advise

## 2021-06-15 ENCOUNTER — Telehealth: Payer: Self-pay | Admitting: *Deleted

## 2021-06-15 NOTE — Telephone Encounter (Signed)
Patient is calling and wants to know when she will be able to return back to work.She is back on work schedule starting Oct. 25th. Please advise.  Patient has to be eval.,stitches removed per Dr Foye Spurling has been scheduled and patient has been notified thru vmessage.

## 2021-06-16 ENCOUNTER — Ambulatory Visit (INDEPENDENT_AMBULATORY_CARE_PROVIDER_SITE_OTHER): Payer: 59 | Admitting: Podiatry

## 2021-06-16 ENCOUNTER — Other Ambulatory Visit: Payer: Self-pay

## 2021-06-16 ENCOUNTER — Encounter: Payer: Self-pay | Admitting: Podiatry

## 2021-06-16 DIAGNOSIS — M2041 Other hammer toe(s) (acquired), right foot: Secondary | ICD-10-CM

## 2021-06-16 DIAGNOSIS — Z9889 Other specified postprocedural states: Secondary | ICD-10-CM

## 2021-06-16 NOTE — Progress Notes (Signed)
She presents today date of surgery 05/27/2021 derotational arthroplasty fifth digit right foot with exostectomy of the fifth toe.  States that it is doing really good seems to have opened up a little bit of the incision but otherwise is doing okay she states that she does get scarring with keloids.  Objective: Vital signs are stable she alert oriented x3 toe does demonstrate some mild edema no erythema cellulitis drainage or odor.  No purulence no malodor.  Assessment: Well-healing surgical toe fifth right.  Plan: Encouraged her to utilize bio oil vitamin E and cocoa butter and discussed the use of a silicone scar guard as well as Mederma cream.  We will give her a note to get back to work 1 November I do think they will take at least this long for her to be able to put this foot in the shoe.

## 2021-06-23 ENCOUNTER — Encounter: Payer: 59 | Admitting: Podiatry

## 2021-06-23 DIAGNOSIS — M79676 Pain in unspecified toe(s): Secondary | ICD-10-CM

## 2021-06-30 ENCOUNTER — Other Ambulatory Visit: Payer: Self-pay

## 2021-06-30 ENCOUNTER — Encounter: Payer: Self-pay | Admitting: Podiatry

## 2021-06-30 ENCOUNTER — Ambulatory Visit (INDEPENDENT_AMBULATORY_CARE_PROVIDER_SITE_OTHER): Payer: 59 | Admitting: Podiatry

## 2021-06-30 DIAGNOSIS — M2041 Other hammer toe(s) (acquired), right foot: Secondary | ICD-10-CM

## 2021-06-30 DIAGNOSIS — Z9889 Other specified postprocedural states: Secondary | ICD-10-CM

## 2021-06-30 NOTE — Progress Notes (Signed)
Presents today date of surgery 05/27/2021 derotational arthroplasty fifth digit with exostectomy of the fifth toe right foot.  States that has a little tender.  Objective: Vital signs stable alert oriented x3 there is no erythema edema cellulitis drainage or reactive hyperkeratotic tissue from what was leftover of the very large callus that was present I debrided that today for her and she is very happy with the outcome of the way that it looks.  Assessment: Well-healing surgical fifth toe right.  Plan: I debrided reactive hyperkeratosis follow-up with me as needed.

## 2021-07-07 ENCOUNTER — Encounter: Payer: 59 | Admitting: Podiatry

## 2021-07-14 ENCOUNTER — Encounter: Payer: 59 | Admitting: Podiatry

## 2021-07-28 ENCOUNTER — Emergency Department (HOSPITAL_COMMUNITY)
Admission: EM | Admit: 2021-07-28 | Discharge: 2021-07-28 | Disposition: A | Discharge status: Home / Self Care | Payer: 59 | Attending: Emergency Medicine | Admitting: Emergency Medicine

## 2021-07-28 ENCOUNTER — Encounter (HOSPITAL_COMMUNITY): Payer: Self-pay

## 2021-07-28 ENCOUNTER — Ambulatory Visit (HOSPITAL_COMMUNITY)
Admission: EM | Admit: 2021-07-28 | Discharge: 2021-07-28 | Disposition: A | Discharge status: Home / Self Care | Payer: 59

## 2021-07-28 ENCOUNTER — Other Ambulatory Visit: Payer: Self-pay

## 2021-07-28 ENCOUNTER — Encounter (HOSPITAL_COMMUNITY): Payer: Self-pay | Admitting: Emergency Medicine

## 2021-07-28 DIAGNOSIS — Z794 Long term (current) use of insulin: Secondary | ICD-10-CM | POA: Diagnosis not present

## 2021-07-28 DIAGNOSIS — E1122 Type 2 diabetes mellitus with diabetic chronic kidney disease: Secondary | ICD-10-CM | POA: Insufficient documentation

## 2021-07-28 DIAGNOSIS — R040 Epistaxis: Secondary | ICD-10-CM | POA: Diagnosis not present

## 2021-07-28 DIAGNOSIS — I129 Hypertensive chronic kidney disease with stage 1 through stage 4 chronic kidney disease, or unspecified chronic kidney disease: Secondary | ICD-10-CM | POA: Insufficient documentation

## 2021-07-28 DIAGNOSIS — Z79899 Other long term (current) drug therapy: Secondary | ICD-10-CM | POA: Diagnosis not present

## 2021-07-28 DIAGNOSIS — N1832 Chronic kidney disease, stage 3b: Secondary | ICD-10-CM | POA: Diagnosis not present

## 2021-07-28 MED ORDER — OXYMETAZOLINE HCL 0.05 % NA SOLN
1.0000 | Freq: Once | NASAL | Status: AC
  Administered 2021-07-28: 1 via NASAL
  Filled 2021-07-28: qty 30

## 2021-07-28 NOTE — ED Provider Notes (Deleted)
Emergency Medicine Provider Triage Evaluation Note  Cynthia Cameron , a 60 y.o. female  was evaluated in triage.  Pt complains of epistaxis.  She is on Xarelto due to history of PEs.  She has been having intermittent epistaxis to the right nare for about 2 weeks.  Happening every other day, able to control the bleeding typically with pressure.  She is now having epistaxis daily, to the right nare but today it started coming out of the left nare as well.  Unable to stop bleeding so came to ED. Marland Kitchen  Review of Systems  Positive: Epistaxis Negative: Syncope  Physical Exam  BP (!) 153/115 (BP Location: Right Arm)   Pulse 81   Temp 97.9 F (36.6 C)   Resp 18   LMP 09/16/2010   SpO2 100%  Gen:   Awake, no distress   Resp:  Normal effort  MSK:   Moves extremities without difficulty  Other:  Bilateral crusted blood not actively draining.Reubin Milan visualize source of the bleeding in the anterior nare to the right or the left.  Medical Decision Making  Medically screening exam initiated at 3:39 PM.  Appropriate orders placed.  Cynthia Cameron was informed that the remainder of the evaluation will be completed by another provider, this initial triage assessment does not replace that evaluation, and the importance of remaining in the ED until their evaluation is complete.  Epistaxis, posterior circulation?   Sherrill Raring, PA-C 07/28/21 1543    Isla Pence, MD 07/28/21 [phone]

## 2021-07-28 NOTE — ED Provider Notes (Signed)
[facility] DEPT Provider Note   CSN: [id number] Arrival date & time: 07/28/21  1445     History Chief Complaint  Patient presents with   Epistaxis    Cynthia Cameron is a 60 y.o. female.   Epistaxis Associated symptoms: no fever    Patient with history of pulmonary embolism on Xarelto presents due to epistaxis.  Started 2 weeks ago, for the first week it was intermittent occurring every other day.  Happens in the right nare, alleviated by applying pressure to the nasal bridge.  This week it started occurring every day and is taking 30 minutes of pressure to get the bleeding to stop.  She came into the ED today because she was unable to get the bleeding to stop, also had some epistaxis to the left nare which is new.  She feels like she is tasting blood in the back of her mouth, lightheadedness or lightheadedness.    Past Medical History:  Diagnosis Date   Allergy    Bipolar depression ([place])    Chest pain    "since 09/2012; hurts all the time" (12/24/2012)   Depression    DVT, lower extremity ([place]) 10/2012   "2" (12/24/2012)   High cholesterol    Hypertension    Iron deficiency anemia    Pneumonia 09/2012   Pulmonary embolism on right ([place]) 09/2012   Shortness of breath    Type II diabetes mellitus ([place])     Patient Active Problem List   Diagnosis Date Noted   Benign essential hypertension 04/12/2021   Body mass index (BMI) 31.0-31.9, adult 04/12/2021   Chronic kidney disease, stage 3b ([place]) 04/12/2021   Diabetic renal disease ([place]) 04/12/2021   Disorder of iron metabolism 04/12/2021   Gout 04/12/2021   Hyperlipidemia 04/12/2021   Malignant hypertensive chronic kidney disease 04/12/2021   Mixed bipolar affective disorder, mild ([place]) 04/12/2021   Osteoarthritis 04/12/2021   Personal history of pulmonary embolism 04/12/2021   Unspecified abnormal finding in specimens from other organs, systems and tissues 04/12/2021   Bipolar I disorder  ([place]) 08/22/2018   Pleuritic chest pain 05/22/2013   Pulmonary embolism ([place]) 11/07/2012   DVT (deep venous thrombosis) ([place]) 11/07/2012   Pericardial effusion 11/06/2012   Pleural effusion 11/05/2012   Diabetes mellitus without complication ([place])    Hypertension    Depression     Past Surgical History:  Procedure Laterality Date   CARDIAC CATHETERIZATION  09/2012   CESAREAN SECTION  1994; [place] ANGIOGRAM N/A 10/25/2012   Procedure: LEFT HEART CATHETERIZATION WITH CORONARY ANGIOGRAM;  Surgeon: Birdie Riddle, MD;  Location: [facility] CATH LAB;  Service: Cardiovascular;  Laterality: N/A;     OB History   No obstetric history on file.     Family History  Problem Relation Age of Onset   Diabetes Mother    Atrial fibrillation Mother    Hypertension Father    Breast cancer Neg Hx     Social History   Tobacco Use   Smoking status: Never   Smokeless tobacco: Never  Substance Use Topics   Alcohol use: Yes    Alcohol/week: 0.0 standard drinks    Comment: Rarely.   Drug use: No    Home Medications Prior to Admission medications   Medication Sig Start Date End Date Taking? Authorizing Provider  allopurinol (ZYLOPRIM) 100 MG tablet Take 100 mg by mouth daily. 03/29/21   [provider]  amLODipine (NORVASC) 10 MG  tablet Take 10 mg by mouth daily. 03/16/21   [provider]  atorvastatin (LIPITOR) 20 MG tablet Take 20 mg by mouth daily. 02/02/21   [provider]  bisoprolol (ZEBETA) 10 MG tablet Take by mouth. 11/06/20   [provider]  buPROPion (WELLBUTRIN SR) 100 MG 12 hr tablet TAKE 1 TABLET BY MOUTH  TWICE DAILY 10/11/20   Cottle, Billey Co., MD  cloNIDine (CATAPRES) 0.2 MG tablet Take 0.2 mg by mouth at bedtime.    [provider]  furosemide (LASIX) 40 MG tablet Take 1 tablet (40 mg total) by mouth daily. 11/28/12   Dixie Dials, MD  glucose blood (FREESTYLE LITE) test strip To check blood sugars  tid dx code 250.00 12/30/12   Copland, Gay Filler, MD  Insulin Syringes, Disposable, U-100 0.5 ML MISC 3-20 Units by Does not apply route 4 (four) times daily -  before meals and at bedtime. 12/25/12   Dixie Dials, MD  levocetirizine (XYZAL) 5 MG tablet Take 5 mg by mouth at bedtime. 02/08/21   [provider]  Multiple Vitamin (MULTIVITAMIN WITH MINERALS) TABS Take 1 tablet by mouth daily.    [provider]  OLANZapine (ZYPREXA) 5 MG tablet TAKE 1 TABLET BY MOUTH EVERYDAY AT BEDTIME 04/25/21   Cottle, Billey Co., MD  OLANZapine-Samidorphan ([name]) 5-10 MG TABS Take 5-10 mg by mouth at bedtime. 04/04/21   Cottle, Billey Co., MD  ondansetron (ZOFRAN) 4 MG tablet Take 1 tablet (4 mg total) by mouth every 8 (eight) hours as needed. 05/25/21   Hyatt, Max T, DPM  potassium chloride SA (KLOR-CON) 20 MEQ tablet Take 20 mEq by mouth daily. 03/15/21   [provider]  spironolactone (ALDACTONE) 25 MG tablet Take 25 mg by mouth daily. 10/21/20   [provider]  XARELTO 20 MG TABS tablet TAKE 1 TABLET BY MOUTH ONCE DAILY WITH SUPPER. 10/01/14   Chesley Mires, MD    Allergies    Patient has no known allergies.  Review of Systems   Review of Systems  Constitutional:  Negative for fever.  HENT:  Positive for nosebleeds.    Physical Exam Updated Vital Signs BP (!) 153/115 (BP Location: Right Arm)   Pulse 81   Temp 97.9 F (36.6 C)   Resp 18   LMP 09/16/2010   SpO2 100%   Physical Exam Vitals and nursing note reviewed. Exam conducted with a chaperone present.  Constitutional:      General: She is not in acute distress.    Appearance: Normal appearance.  HENT:     Head: Normocephalic and atraumatic.     Nose:     Right Nostril: Epistaxis present.     Left Nostril: No epistaxis.     Right Turbinates: Enlarged.     Left Turbinates: Enlarged.     Comments: Injected enlarged nasal turbinates bilaterally.  Patient has some crusted blood to the right nare, no longer  actively draining.  Left nares without any blood, no active bleeding.    Mouth/Throat:     Pharynx: Uvula midline.     Tonsils: No tonsillar exudate.     Comments: No actively draining blood to the posterior pharynx. Eyes:     General: No scleral icterus.    Extraocular Movements: Extraocular movements intact.     Pupils: Pupils are equal, round, and reactive to light.  Skin:    Coloration: Skin is not jaundiced.  Neurological:     Mental Status: She is  alert. Mental status is at baseline.     Coordination: Coordination normal.   ED Results / Procedures / Treatments   Labs (all labs ordered are listed, but only abnormal results are displayed) Labs Reviewed - No data to display  EKG None  Radiology No results found.  Procedures Procedures   Medications Ordered in ED Medications  oxymetazoline (AFRIN) 0.05 % nasal spray 1 spray (1 spray Each Nare Given 07/28/21 1557)    ED Course  I have reviewed the triage vital signs and the nursing notes.  Pertinent labs & imaging results that were available during my care of the patient were reviewed by me and considered in my medical decision making (see chart for details).    MDM Rules/Calculators/A&P                           Stable vitals, epistaxis ceased with Afrin.  Anterior bleed, will refer to ENT and have her continue taking her Xarelto.  Patient discharged in stable condition.  Final Clinical Impression(s) / ED Diagnoses Final diagnoses:  None    Rx / DC Orders ED Discharge Orders     None        Sherrill Raring, [place] 08/01/21 1731    Isla Pence, MD 08/01/21 [phone]

## 2021-07-28 NOTE — ED Triage Notes (Signed)
Pt c/o nose bleed that started yesterday recurred again today. Pt is is on xarelto. States she has lost a lot of blood today. Bleeding is currently controlled with the patient holding pressure

## 2021-07-28 NOTE — ED Triage Notes (Signed)
Pt reports that she tried using Afrin yesterday.

## 2021-07-28 NOTE — ED Triage Notes (Signed)
Pt having bilat nostril nosed bleed that started about 20 some minutes ago. Reports takes Nutritional therapist.

## 2021-07-28 NOTE — Discharge Instructions (Addendum)
Apply pressure to the nasal bridge to stop the bleeding.  Continue taking your Xarelto.  Call the ENT office listed above to arrange follow-up with the ear nose and throat physician.  Call them first thing tomorrow when they open.  You start having bleeding apply pressure, you can use the Afrin for uncontrollable bleed but do not use it 3 days in a row.  Call your primary care doctor let them know what is going on to see if they have any additional recommendations.  Return to the ED if the bleeding does not stop.

## 2021-07-28 NOTE — ED Notes (Signed)
Patient is being discharged from the Urgent Care and sent to the Emergency Department via POV . Per Dr Lanny Cramp, patient is in need of higher level of care due to nosebleed on Xeralto. Patient is aware and verbalizes understanding of plan of care.  Vitals:   07/28/21 1345  BP: (!) 136/95  Pulse: 77  Resp: 17  Temp: 97.8 F (36.6 C)  SpO2: 100%

## 2021-09-08 ENCOUNTER — Ambulatory Visit: Payer: 59 | Admitting: Psychiatry

## 2021-09-24 ENCOUNTER — Other Ambulatory Visit: Payer: Self-pay | Admitting: Psychiatry

## 2021-09-24 DIAGNOSIS — F3174 Bipolar disorder, in full remission, most recent episode manic: Secondary | ICD-10-CM

## 2021-10-05 ENCOUNTER — Encounter: Payer: Self-pay | Admitting: Psychiatry

## 2021-10-05 ENCOUNTER — Ambulatory Visit (INDEPENDENT_AMBULATORY_CARE_PROVIDER_SITE_OTHER): Payer: 59 | Admitting: Psychiatry

## 2021-10-05 ENCOUNTER — Other Ambulatory Visit: Payer: Self-pay

## 2021-10-05 DIAGNOSIS — F3174 Bipolar disorder, in full remission, most recent episode manic: Secondary | ICD-10-CM

## 2021-10-05 MED ORDER — BUPROPION HCL ER (SR) 100 MG PO TB12: 100.0000 mg | ORAL_TABLET | Freq: Two times a day (BID) | ORAL | 3 refills | Status: DC

## 2021-10-05 MED ORDER — LYBALVI 5-10 MG PO TABS: 5.0000 mg | ORAL_TABLET | Freq: Every day | ORAL | 11 refills | Status: DC

## 2021-10-05 NOTE — Progress Notes (Signed)
Cynthia Cameron [id number] Jul 22, 1961 61 y.o.  Subjective:   Patient ID:  Cynthia Cameron is a 61 y.o. (DOB 1961/03/21) female.  Chief Complaint:  Chief Complaint  Patient presents with   Follow-up    Bipolar 1 disorder, manic, full remission ([place])    HPI   Cynthia Cameron presents to the office today for follow-up of bipolar disorder 1.  09/09/20 appt noted:  Taking 1/2 of 5 mg olanzapine and 1/2 of Wellbutrin 100 mg tablets. No Covid.   Mood has remained good.  No periods of confusion. No fear. Patient reports stable mood and denies depressed or irritable moods.  No mood swings.  No episodes paranoia, fear.  Patient denies any recent difficulty with anxiety.  Patient denies difficulty with sleep initiation or maintenance. 6-7 hours sleep with schedule plus nap. To work 5 AM.  Denies appetite disturbance.  Patient reports that energy and motivation have been good.  Patient denies any difficulty with concentration.  Patient denies any suicidal ideation.  Not drowsy. Dropped 15-20# with diet change. Plan no med changes  10/05/2021 appointment with the following noted: Tried to get her [name] but not successful. Took [name] for while and loved it bc helped curb appetite  but insurance cost was high.  Wants to retry it. Wonderful  no mental health problems. Semi retiring in June.  Will be 61 yo. No weekends and no Mondays and pleased.   H retired from [employer] early. Son grad Industrial/product designer.  Past Psychiatric Medication Trials: Gabapentin, olanzapine, Wellbutrin SR 100 Under our psychiatric care for bipolar disorder type I since November 1998 1 hosp with manic psychosis with confusion.  Review of Systems:  Review of Systems  Cardiovascular:  Negative for palpitations.  Musculoskeletal:  Positive for arthralgias.  Neurological:  Negative for tremors and weakness.  Psychiatric/Behavioral:  Negative for agitation, behavioral problems, confusion, decreased  concentration, dysphoric mood, hallucinations, self-injury, sleep disturbance and suicidal ideas. The patient is not nervous/anxious and is not hyperactive.    Medications: I have reviewed the patient's current medications.  Current Outpatient Medications  Medication Sig Dispense Refill   allopurinol (ZYLOPRIM) 100 MG tablet Take 100 mg by mouth daily.     amLODipine (NORVASC) 10 MG tablet Take 10 mg by mouth daily.     atorvastatin (LIPITOR) 20 MG tablet Take 20 mg by mouth daily.     cloNIDine (CATAPRES) 0.2 MG tablet Take 0.2 mg by mouth at bedtime.     furosemide (LASIX) 40 MG tablet Take 1 tablet (40 mg total) by mouth daily. 30 tablet 1   glucose blood (FREESTYLE LITE) test strip To check blood sugars tid dx code 250.00 100 each 3   Insulin Syringes, Disposable, U-100 0.5 ML MISC 3-20 Units by Does not apply route 4 (four) times daily -  before meals and at bedtime. 120 each 1   levocetirizine (XYZAL) 5 MG tablet Take 5 mg by mouth at bedtime.     Multiple Vitamin (MULTIVITAMIN WITH MINERALS) TABS Take 1 tablet by mouth daily.     ondansetron (ZOFRAN) 4 MG tablet Take 1 tablet (4 mg total) by mouth every 8 (eight) hours as needed. 20 tablet 0   potassium chloride SA (KLOR-CON) 20 MEQ tablet Take 20 mEq by mouth daily.     XARELTO 20 MG TABS tablet TAKE 1 TABLET BY MOUTH ONCE DAILY WITH SUPPER. 30 tablet 2   bisoprolol (ZEBETA) 10 MG tablet Take by mouth. (Patient not taking: Reported on  10/05/2021)     buPROPion ER (WELLBUTRIN SR) 100 MG 12 hr tablet Take 1 tablet (100 mg total) by mouth 2 (two) times daily. 90 tablet 3   OLANZapine-Samidorphan ([name]) 5-10 MG TABS Take 5-10 mg by mouth at bedtime. 30 tablet 11   spironolactone (ALDACTONE) 25 MG tablet Take 25 mg by mouth daily. (Patient not taking: Reported on 10/05/2021)     No current facility-administered medications for this visit.    Medication Side Effects: None  Allergies: No Known Allergies  Past Medical History:   Diagnosis Date   Allergy    Bipolar depression ([place])    Chest pain    "since 09/2012; hurts all the time" (12/24/2012)   Depression    DVT, lower extremity ([place]) 10/2012   "2" (12/24/2012)   High cholesterol    Hypertension    Iron deficiency anemia    Pneumonia 09/2012   Pulmonary embolism on right ([place]) 09/2012   Shortness of breath    Type II diabetes mellitus ([place])     Family History  Problem Relation Age of Onset   Diabetes Mother    Atrial fibrillation Mother    Hypertension Father    Breast cancer Neg Hx     Social History   Socioeconomic History   Marital status: Married    Spouse name: Not on file   Number of children: Not on file   Years of education: Not on file   Highest education level: Not on file  Occupational History   Not on file  Tobacco Use   Smoking status: Never   Smokeless tobacco: Never  Substance and Sexual Activity   Alcohol use: Yes    Alcohol/week: 0.0 standard drinks    Comment: Rarely.   Drug use: No   Sexual activity: Not on file  Other Topics Concern   Not on file  Social History Narrative   LIves in [place]. 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    Social Determinants of Health   Financial Resource Strain: Not on file  Food Insecurity: Not on file  Transportation Needs: Not on file  Physical Activity: Not on file  Stress: Not on file  Social Connections: Not on file  Intimate Partner Violence: Not on file    Past Medical History, Surgical history, Social history, and Family history were reviewed and updated as appropriate.   Please see review of systems for further details on the patient's review from today.   Objective:   Physical Exam:  LMP 09/16/2010   Physical Exam Constitutional:      General: She is not in acute distress.    Appearance: She is well-developed.  Musculoskeletal:        General: No deformity.  Neurological:     Mental Status: She is alert and oriented to person, place,  and time.     Motor: No tremor.     Coordination: Coordination normal.     Gait: Gait normal.  Psychiatric:        Attention and Perception: Attention and perception normal.        Mood and Affect: Mood is not anxious or depressed. Affect is not labile, blunt, angry or inappropriate.        Speech: Speech normal. Speech is not rapid and pressured.        Behavior: Behavior normal.        Thought Content: Thought content normal. Thought content does not include homicidal or suicidal ideation. Thought  content does not include homicidal or suicidal plan.        Cognition and Memory: Cognition normal.        Judgment: Judgment normal.     Comments: Insight intact. No auditory or visual hallucinations. No delusions.     Lab Review:     Component Value Date/Time   NA 138 10/14/2018 2003   K 2.9 (L) 10/14/2018 2003   CL 103 10/14/2018 2003   CO2 25 10/14/2018 2003   GLUCOSE 94 10/14/2018 2003   BUN 38 (H) 10/14/2018 2003   CREATININE 1.53 (H) 10/14/2018 2003   CREATININE 1.48 (H) 09/24/2013 1736   CALCIUM 9.4 10/14/2018 2003   PROT 7.7 09/24/2013 1736   ALBUMIN 4.3 09/24/2013 1736   AST 17 09/24/2013 1736   ALT 16 09/24/2013 1736   ALKPHOS 103 09/24/2013 1736   BILITOT 0.4 09/24/2013 1736   GFRNONAA 37 (L) 10/14/2018 2003   GFRNONAA 40 (L) 01/29/2012 1616   GFRAA 43 (L) 10/14/2018 2003   GFRAA 46 (L) 01/29/2012 1616       Component Value Date/Time   WBC 7.3 10/14/2018 2003   RBC 4.27 10/14/2018 2003   HGB 11.6 (L) 10/14/2018 2003   HGB 12.8 06/26/2016 1055   HCT 36.2 10/14/2018 2003   HCT 38.3 06/26/2016 1055   PLT 257 10/14/2018 2003   PLT 298 06/26/2016 1055   MCV 84.8 10/14/2018 2003   MCV 83 06/26/2016 1055   MCH 27.2 10/14/2018 2003   MCHC 32.0 10/14/2018 2003   RDW 14.1 10/14/2018 2003   RDW 14.9 06/26/2016 1055   LYMPHSABS 1.7 06/26/2016 1055   MONOABS 0.4 05/11/2014 1500   EOSABS 0.2 06/26/2016 1055   BASOSABS 0.0 06/26/2016 1055    No results found for:  POCLITH, LITHIUM   No results found for: PHENYTOIN, PHENOBARB, VALPROATE, CBMZ   .res Assessment: Plan:    Bipolar 1 disorder, manic, full remission ([place]) - Plan: OLANZapine-Samidorphan ([name]) 5-10 MG TABS, buPROPion ER (WELLBUTRIN SR) 100 MG 12 hr tablet   Very med sensitive so gets by with low dosages.  Diabetes resolved with weight loss.  Cynthia Cameron has been under my psychiatric care since October 1998 when she was diagnosed with bipolar disorder mania with mild mood congruent psychotic features.  She responded very well very quickly to very low-dose Zyprexa 2.5 mg nightly plus gabapentin.   later on she had some mild depression which remitted with very low-dose Wellbutrin 50 to 100 mg nightly.  She is been stable for several years since then.  She has a history of hypomania when olanzapine was reduced to 1.25 mg daily.  Discussed potential metabolic side effects associated with atypical antipsychotics, as well as potential risk for movement side effects. Advised pt to contact office if movement side effects occur.   Disc option of new med. Yes trial of [name] to see if weight loss. No med changes indicated.   Continue 1/2 of Wellbutrin Retry [name] 5-10 mg HS. Maybe better covered this year  Has been very stable.  FU 1 year  Cynthia Parents, MD, DFAPA   Please see After Visit Summary for patient specific instructions.  No future appointments.  No orders of the defined types were placed in this encounter.     -------------------------------

## 2021-10-20 ENCOUNTER — Telehealth: Payer: Self-pay

## 2021-10-20 NOTE — Telephone Encounter (Signed)
Thank you!  I know she thanks you too.  She's grateful to have it.

## 2021-10-20 NOTE — Telephone Encounter (Signed)
Prior Authorization submitted and approved for [name] 5-10MG  effective 10/17/2021-10/17/2022 with [employer]

## 2021-11-13 ENCOUNTER — Other Ambulatory Visit: Payer: Self-pay | Admitting: Psychiatry

## 2021-11-13 DIAGNOSIS — F3174 Bipolar disorder, in full remission, most recent episode manic: Secondary | ICD-10-CM

## 2021-12-09 ENCOUNTER — Other Ambulatory Visit: Payer: Self-pay | Admitting: Psychiatry

## 2021-12-09 DIAGNOSIS — F3174 Bipolar disorder, in full remission, most recent episode manic: Secondary | ICD-10-CM

## 2021-12-25 ENCOUNTER — Other Ambulatory Visit: Payer: Self-pay | Admitting: Psychiatry

## 2021-12-25 DIAGNOSIS — F3174 Bipolar disorder, in full remission, most recent episode manic: Secondary | ICD-10-CM

## 2022-03-14 ENCOUNTER — Other Ambulatory Visit: Payer: Self-pay | Admitting: Family Medicine

## 2022-03-14 DIAGNOSIS — Z1231 Encounter for screening mammogram for malignant neoplasm of breast: Secondary | ICD-10-CM

## 2022-04-27 ENCOUNTER — Ambulatory Visit
Admission: RE | Admit: 2022-04-27 | Discharge: 2022-04-27 | Disposition: A | Discharge status: Home / Self Care | Payer: 59 | Source: Ambulatory Visit | Attending: Family Medicine | Admitting: Family Medicine

## 2022-04-27 DIAGNOSIS — Z1231 Encounter for screening mammogram for malignant neoplasm of breast: Secondary | ICD-10-CM

## 2022-10-05 ENCOUNTER — Ambulatory Visit: Payer: 59 | Admitting: Psychiatry

## 2022-10-09 ENCOUNTER — Ambulatory Visit: Payer: 59 | Admitting: Psychiatry

## 2022-10-19 ENCOUNTER — Ambulatory Visit (INDEPENDENT_AMBULATORY_CARE_PROVIDER_SITE_OTHER): Payer: 59 | Admitting: Psychiatry

## 2022-10-19 ENCOUNTER — Encounter: Payer: Self-pay | Admitting: Psychiatry

## 2022-10-19 DIAGNOSIS — F3174 Bipolar disorder, in full remission, most recent episode manic: Secondary | ICD-10-CM | POA: Diagnosis not present

## 2022-10-19 MED ORDER — BUPROPION HCL ER (SR) 100 MG PO TB12: 100.0000 mg | ORAL_TABLET | Freq: Two times a day (BID) | ORAL | 2 refills | Status: DC

## 2022-10-19 MED ORDER — LYBALVI 5-10 MG PO TABS: 5.0000 mg | ORAL_TABLET | Freq: Every day | ORAL | 11 refills | Status: DC

## 2022-10-19 NOTE — Progress Notes (Signed)
Cynthia Cameron [id number] Aug 08, 1961 62 y.o.  Subjective:   Patient ID:  Cynthia Cameron is a 62 y.o. (DOB March 08, 1961) female.  Chief Complaint:  Chief Complaint  Patient presents with   Follow-up    HPI   Cynthia Cameron presents to the office today for follow-up of bipolar disorder 1.  09/09/20 appt noted:  Taking 1/2 of 5 mg olanzapine and 1/2 of Wellbutrin 100 mg tablets. No Covid.   Mood has remained good.  No periods of confusion. No fear. Patient reports stable mood and denies depressed or irritable moods.  No mood swings.  No episodes paranoia, fear.  Patient denies any recent difficulty with anxiety.  Patient denies difficulty with sleep initiation or maintenance. 6-7 hours sleep with schedule plus nap. To work 5 AM.  Denies appetite disturbance.  Patient reports that energy and motivation have been good.  Patient denies any difficulty with concentration.  Patient denies any suicidal ideation.  Not drowsy. Dropped 15-20# with diet change. Plan no med changes  10/05/2021 appointment with the following noted: Tried to get her [name] but not successful. Took [name] for while and loved it bc helped curb appetite  but insurance cost was high.  Wants to retry it. Wonderful  no mental health problems. Semi retiring in June.  Will be 62 yo. No weekends and no Mondays and pleased. Plan: Continue 1/2 of Wellbutrin Retry [name] 5-10 mg HS.  10/19/22 appt noted: Remains on Wellbutrin  1/2 of SR 100 mg every morning.  Change to 1/2 of [name] 5 mg nightly from olanzapine 2.5 mg nightly. Has been able to stay on the [name] and have lost 15# on the [name] vs olanzapine.   Hopes to lose wt and get off more meds.  BP and wt under control.  No insuline needed in 3 years. Eating right and exercising like she should. CKD stage 4 and has seen nephrologist. Semi retired and PT.  Loves it. [place].  H retired from [employer]  early.  Helps some.   Son grad Industrial/product designer.  Past Psychiatric Medication Trials: Gabapentin, olanzapine, Wellbutrin SR 100 Under our psychiatric care for bipolar disorder type I since November 1998 1 hosp with manic psychosis with confusion. H supports continue meds.  Relapsed off med  Review of Systems:  Review of Systems  Cardiovascular:  Negative for palpitations.  Musculoskeletal:  Positive for arthralgias.  Neurological:  Negative for tremors.  Psychiatric/Behavioral:  Negative for agitation, behavioral problems, confusion, decreased concentration, dysphoric mood, hallucinations, self-injury, sleep disturbance and suicidal ideas. The patient is not nervous/anxious and is not hyperactive.     Medications: I have reviewed the patient's current medications.  Current Outpatient Medications  Medication Sig Dispense Refill   allopurinol (ZYLOPRIM) 100 MG tablet Take 100 mg by mouth daily.     amLODipine (NORVASC) 10 MG tablet Take 10 mg by mouth daily.     atorvastatin (LIPITOR) 20 MG tablet Take 20 mg by mouth daily.     bisoprolol (ZEBETA) 10 MG tablet Take by mouth.     cloNIDine (CATAPRES) 0.2 MG tablet Take 0.2 mg by mouth at bedtime.     furosemide (LASIX) 40 MG tablet Take 1 tablet (40 mg total) by mouth daily. 30 tablet 1   glucose blood (FREESTYLE LITE) test strip To check blood sugars tid dx code 250.00 100 each 3   Insulin Syringes, Disposable, U-100 0.5 ML MISC 3-20 Units by Does not  apply route 4 (four) times daily -  before meals and at bedtime. 120 each 1   levocetirizine (XYZAL) 5 MG tablet Take 5 mg by mouth at bedtime.     Multiple Vitamin (MULTIVITAMIN WITH MINERALS) TABS Take 1 tablet by mouth daily.     ondansetron (ZOFRAN) 4 MG tablet Take 1 tablet (4 mg total) by mouth every 8 (eight) hours as needed. 20 tablet 0   potassium chloride SA (KLOR-CON) 20 MEQ tablet Take 20 mEq by mouth daily.     spironolactone (ALDACTONE) 25 MG tablet Take 25 mg by mouth daily.      XARELTO 20 MG TABS tablet TAKE 1 TABLET BY MOUTH ONCE DAILY WITH SUPPER. 30 tablet 2   buPROPion ER (WELLBUTRIN SR) 100 MG 12 hr tablet Take 1 tablet (100 mg total) by mouth 2 (two) times daily. 180 tablet 2   OLANZapine-Samidorphan ([name]) 5-10 MG TABS Take 5-10 mg by mouth at bedtime. 30 tablet 11   No current facility-administered medications for this visit.    Medication Side Effects: None  Allergies: No Known Allergies  Past Medical History:  Diagnosis Date   Allergy    Bipolar depression ([place])    Chest pain    "since 09/2012; hurts all the time" (12/24/2012)   Depression    DVT, lower extremity ([place]) 10/2012   "2" (12/24/2012)   High cholesterol    Hypertension    Iron deficiency anemia    Pneumonia 09/2012   Pulmonary embolism on right ([place]) 09/2012   Shortness of breath    Type II diabetes mellitus ([place])     Family History  Problem Relation Age of Onset   Diabetes Mother    Atrial fibrillation Mother    Hypertension Father    Breast cancer Neg Hx     Social History   Socioeconomic History   Marital status: Married    Spouse name: Not on file   Number of children: Not on file   Years of education: Not on file   Highest education level: Not on file  Occupational History   Not on file  Tobacco Use   Smoking status: Never   Smokeless tobacco: Never  Substance and Sexual Activity   Alcohol use: Yes    Alcohol/week: 0.0 standard drinks of alcohol    Comment: Rarely.   Drug use: No   Sexual activity: Not on file  Other Topics Concern   Not on file  Social History Narrative   LIves in [place]. 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    Social Determinants of Health   Financial Resource Strain: Not on file  Food Insecurity: Not on file  Transportation Needs: Not on file  Physical Activity: Not on file  Stress: Not on file  Social Connections: Not on file  Intimate Partner Violence: Not on file    Past Medical History,  Surgical history, Social history, and Family history were reviewed and updated as appropriate.   Please see review of systems for further details on the patient's review from today.   Objective:   Physical Exam:  LMP 09/16/2010   Physical Exam Constitutional:      General: She is not in acute distress.    Appearance: She is well-developed.  Musculoskeletal:        General: No deformity.  Neurological:     Mental Status: She is alert and oriented to person, place, and time.     Motor: No tremor.  Coordination: Coordination normal.     Gait: Gait normal.  Psychiatric:        Attention and Perception: Attention and perception normal.        Mood and Affect: Mood is not anxious or depressed. Affect is not labile, blunt, angry or inappropriate.        Speech: Speech normal. Speech is not rapid and pressured.        Behavior: Behavior normal.        Thought Content: Thought content normal. Thought content is not delusional. Thought content does not include homicidal or suicidal ideation. Thought content does not include suicidal plan.        Cognition and Memory: Cognition normal.        Judgment: Judgment normal.     Comments: Insight intact. No auditory or visual hallucinations. No delusions.      Lab Review:     Component Value Date/Time   NA 138 10/14/2018 2003   K 2.9 (L) 10/14/2018 2003   CL 103 10/14/2018 2003   CO2 25 10/14/2018 2003   GLUCOSE 94 10/14/2018 2003   BUN 38 (H) 10/14/2018 2003   CREATININE 1.53 (H) 10/14/2018 2003   CREATININE 1.48 (H) 09/24/2013 1736   CALCIUM 9.4 10/14/2018 2003   PROT 7.7 09/24/2013 1736   ALBUMIN 4.3 09/24/2013 1736   AST 17 09/24/2013 1736   ALT 16 09/24/2013 1736   ALKPHOS 103 09/24/2013 1736   BILITOT 0.4 09/24/2013 1736   GFRNONAA 37 (L) 10/14/2018 2003   GFRNONAA 40 (L) 01/29/2012 1616   GFRAA 43 (L) 10/14/2018 2003   GFRAA 46 (L) 01/29/2012 1616       Component Value Date/Time   WBC 7.3 10/14/2018 2003   RBC  4.27 10/14/2018 2003   HGB 11.6 (L) 10/14/2018 2003   HGB 12.8 06/26/2016 1055   HCT 36.2 10/14/2018 2003   HCT 38.3 06/26/2016 1055   PLT 257 10/14/2018 2003   PLT 298 06/26/2016 1055   MCV 84.8 10/14/2018 2003   MCV 83 06/26/2016 1055   MCH 27.2 10/14/2018 2003   MCHC 32.0 10/14/2018 2003   RDW 14.1 10/14/2018 2003   RDW 14.9 06/26/2016 1055   LYMPHSABS 1.7 06/26/2016 1055   MONOABS 0.4 05/11/2014 1500   EOSABS 0.2 06/26/2016 1055   BASOSABS 0.0 06/26/2016 1055    No results found for: "POCLITH", "LITHIUM"   No results found for: "PHENYTOIN", "PHENOBARB", "VALPROATE", "CBMZ"   .res Assessment: Plan:    Bipolar 1 disorder, manic, full remission ([place]) - Plan: OLANZapine-Samidorphan ([name]) 5-10 MG TABS, buPROPion ER (WELLBUTRIN SR) 100 MG 12 hr tablet   Very med sensitive so gets by with low dosages.  Diabetes resolved with weight loss.  High CR appears stable.  Nasreen has been under my psychiatric care since October 1998 when she was diagnosed with bipolar disorder mania with mild mood congruent psychotic features.  She responded very well very quickly to very low-dose Zyprexa 2.5 mg nightly plus gabapentin.   later on she had some mild depression which remitted with very low-dose Wellbutrin 50 to 100 mg nightly.  She is been stable for several years since then.  She has a history of hypomania when olanzapine was reduced to 1.25 mg daily.  Discussed potential metabolic side effects associated with atypical antipsychotics, as well as potential risk for movement side effects. Advised pt to contact office if movement side effects occur.   No med changes indicated.   Continue 1/2 of Wellbutrin continue  [name] 5-10 mg HS., helped to lose wt.  better covered this year  Has been very stable.  FU 1 year  Lynder Parents, MD, DFAPA   Please see After Visit Summary for patient specific instructions.  No future appointments.  No orders of the defined types were placed in  this encounter.     -------------------------------

## 2022-10-22 ENCOUNTER — Other Ambulatory Visit: Payer: Self-pay | Admitting: Psychiatry

## 2022-10-22 DIAGNOSIS — F3174 Bipolar disorder, in full remission, most recent episode manic: Secondary | ICD-10-CM

## 2022-11-12 ENCOUNTER — Telehealth: Payer: Self-pay

## 2023-03-28 ENCOUNTER — Other Ambulatory Visit: Payer: Self-pay | Admitting: Family Medicine

## 2023-03-28 DIAGNOSIS — Z1231 Encounter for screening mammogram for malignant neoplasm of breast: Secondary | ICD-10-CM

## 2023-05-01 ENCOUNTER — Telehealth: Payer: Self-pay | Admitting: Psychiatry

## 2023-05-01 NOTE — Telephone Encounter (Signed)
Patient has RF available at pharmacy. Notified her.

## 2023-05-01 NOTE — Telephone Encounter (Signed)
Patient called in for refill on Bupropion 100mg . Ph: [phone] appt 2/27 Pharmacy [facility] [address] [place]

## 2023-05-03 ENCOUNTER — Ambulatory Visit
Admission: RE | Admit: 2023-05-03 | Discharge: 2023-05-03 | Disposition: A | Discharge status: Home / Self Care | Payer: 59 | Source: Ambulatory Visit | Attending: Family Medicine | Admitting: Family Medicine

## 2023-05-03 DIAGNOSIS — Z1231 Encounter for screening mammogram for malignant neoplasm of breast: Secondary | ICD-10-CM

## 2023-10-25 ENCOUNTER — Ambulatory Visit: Payer: 59 | Admitting: Psychiatry

## 2023-10-25 ENCOUNTER — Encounter: Payer: Self-pay | Admitting: Psychiatry

## 2023-10-25 DIAGNOSIS — F3174 Bipolar disorder, in full remission, most recent episode manic: Secondary | ICD-10-CM

## 2023-10-25 MED ORDER — BUPROPION HCL ER (SR) 100 MG PO TB12: 100.0000 mg | ORAL_TABLET | Freq: Two times a day (BID) | ORAL | 1 refills | Status: DC

## 2023-10-25 MED ORDER — LYBALVI 5-10 MG PO TABS: 5.0000 mg | ORAL_TABLET | Freq: Every day | ORAL | 11 refills | Status: DC

## 2023-10-25 NOTE — Progress Notes (Signed)
 Cynthia Cameron [id number] Nov 12, 1960 63 y.o.  Subjective:   Patient ID:  Cynthia Cameron is a 63 y.o. (DOB 11/16/1960) female.  Chief Complaint:  Chief Complaint  Patient presents with   Follow-up    HPI   Cynthia Cameron presents to the office today for follow-up of bipolar disorder 1.  09/09/20 appt noted:  Taking 1/2 of 5 mg olanzapine and 1/2 of Wellbutrin 100 mg tablets. No Covid.   Mood has remained good.  No periods of confusion. No fear. Patient reports stable mood and denies depressed or irritable moods.  No mood swings.  No episodes paranoia, fear.  Patient denies any recent difficulty with anxiety.  Patient denies difficulty with sleep initiation or maintenance. 6-7 hours sleep with schedule plus nap. To work 5 AM.  Denies appetite disturbance.  Patient reports that energy and motivation have been good.  Patient denies any difficulty with concentration.  Patient denies any suicidal ideation.  Not drowsy. Dropped 15-20# with diet change. Plan no med changes  10/05/2021 appointment with the following noted: Tried to get her [name] but not successful. Took [name] for while and loved it bc helped curb appetite  but insurance cost was high.  Wants to retry it. Wonderful  no mental health problems. Semi retiring in June.  Will be 63 yo. No weekends and no Mondays and pleased. Plan: Continue 1/2 of Wellbutrin Retry [name] 5-10 mg HS.  10/19/22 appt noted: Remains on Wellbutrin  1/2 of SR 100 mg every morning.  Change to 1/2 of [name] 5 mg nightly from olanzapine 2.5 mg nightly. Has been able to stay on the [name] and have lost 15# on the [name] vs olanzapine.   Hopes to lose wt and get off more meds.  BP and wt under control.  No insuline needed in 3 years. Eating right and exercising like she should. CKD stage 4 and has seen nephrologist. Semi retired and PT.  Loves it. [place].  10/25/23 appt noted: Keeps gkids.   H ministering.  [employer] .  PT work.   Med: Wellbutrin SR 1/2 tablet in the AM, [name] 5 mg 1/2 tablet at night. Exercises daily at gym at home.   Mental health good.  No issues. Nofear.  No dep.  Sleep well. Keeps blood sugar controlled.  Feels good with health.  H retired from [employer] early.  Helps some.   Son grad Warden/ranger.  Past Psychiatric Medication Trials: Gabapentin, olanzapine, Wellbutrin SR 100 Under our psychiatric care for bipolar disorder type I since November 1998 1 hosp with manic psychosis with confusion. H supports continue meds.  Relapsed off med  Review of Systems:  Review of Systems  Cardiovascular:  Negative for palpitations.  Musculoskeletal:  Positive for arthralgias.  Neurological:  Negative for tremors.  Psychiatric/Behavioral:  Negative for agitation, behavioral problems, confusion, decreased concentration, dysphoric mood, hallucinations, self-injury, sleep disturbance and suicidal ideas. The patient is not nervous/anxious and is not hyperactive.     Medications: I have reviewed the patient's current medications.  Current Outpatient Medications  Medication Sig Dispense Refill   allopurinol (ZYLOPRIM) 100 MG tablet Take 100 mg by mouth daily.     amLODipine (NORVASC) 10 MG tablet Take 10 mg by mouth daily.     atorvastatin (LIPITOR) 20 MG tablet Take 20 mg by mouth daily.     bisoprolol (ZEBETA) 10 MG tablet Take by mouth.     buPROPion ER [facility] SR)  100 MG 12 hr tablet Take 1 tablet (100 mg total) by mouth 2 (two) times daily. (Patient taking differently: Take by mouth 2 (two) times daily. 1/2 tablet in the AM) 180 tablet 2   cloNIDine (CATAPRES) 0.2 MG tablet Take 0.2 mg by mouth at bedtime.     furosemide (LASIX) 40 MG tablet Take 1 tablet (40 mg total) by mouth daily. 30 tablet 1   glucose blood (FREESTYLE LITE) test strip To check blood sugars tid dx code 250.00 100 each 3   Insulin Syringes, Disposable, U-100 0.5 ML MISC 3-20  Units by Does not apply route 4 (four) times daily -  before meals and at bedtime. 120 each 1   levocetirizine (XYZAL) 5 MG tablet Take 5 mg by mouth at bedtime.     Multiple Vitamin (MULTIVITAMIN WITH MINERALS) TABS Take 1 tablet by mouth daily.     OLANZapine-Samidorphan ([name]) 5-10 MG TABS Take 5-10 mg by mouth at bedtime. (Patient taking differently: Take 5-10 mg by mouth at bedtime. 1/2 tablet nightly) 30 tablet 11   ondansetron (ZOFRAN) 4 MG tablet Take 1 tablet (4 mg total) by mouth every 8 (eight) hours as needed. 20 tablet 0   potassium chloride SA (KLOR-CON) 20 MEQ tablet Take 20 mEq by mouth daily.     spironolactone (ALDACTONE) 25 MG tablet Take 25 mg by mouth daily.     XARELTO 20 MG TABS tablet TAKE 1 TABLET BY MOUTH ONCE DAILY WITH SUPPER. 30 tablet 2   No current facility-administered medications for this visit.    Medication Side Effects: None  Allergies: No Known Allergies  Past Medical History:  Diagnosis Date   Allergy    Bipolar depression (HCC)    Chest pain    "since 09/2012; hurts all the time" (12/24/2012)   Depression    DVT, lower extremity (HCC) 10/2012   "2" (12/24/2012)   High cholesterol    Hypertension    Iron deficiency anemia    Pneumonia 09/2012   Pulmonary embolism on right (HCC) 09/2012   Shortness of breath    Type II diabetes mellitus (HCC)     Family History  Problem Relation Age of Onset   Diabetes Mother    Atrial fibrillation Mother    Hypertension Father    Breast cancer Neg Hx     Social History   Socioeconomic History   Marital status: Married    Spouse name: Not on file   Number of children: Not on file   Years of education: Not on file   Highest education level: Not on file  Occupational History   Not on file  Tobacco Use   Smoking status: Never   Smokeless tobacco: Never  Substance and Sexual Activity   Alcohol use: Yes    Alcohol/week: 0.0 standard drinks of alcohol    Comment: Rarely.   Drug use: No   Sexual  activity: Not on file  Other Topics Concern   Not on file  Social History Narrative   LIves in [place]. 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 Lives with husband and 2 sons.    Social Drivers of Corporate investment banker Strain: Not on file  Food Insecurity: Not on file  Transportation Needs: Not on file  Physical Activity: Not on file  Stress: Not on file  Social Connections: Not on file  Intimate Partner Violence: Not on file    Past Medical History, Surgical history, Social history, and Family history were reviewed and updated  as appropriate.   Please see review of systems for further details on the patient's review from today.   Objective:   Physical Exam:  LMP 09/16/2010   Physical Exam Constitutional:      General: She is not in acute distress.    Appearance: She is well-developed.  Musculoskeletal:        General: No deformity.  Neurological:     Mental Status: She is alert and oriented to person, place, and time.     Motor: No tremor.     Coordination: Coordination normal.     Gait: Gait normal.  Psychiatric:        Attention and Perception: Attention and perception normal. She does not perceive auditory hallucinations.        Mood and Affect: Mood is not anxious or depressed. Affect is not labile, blunt, angry or inappropriate.        Speech: Speech normal. Speech is not rapid and pressured.        Behavior: Behavior normal.        Thought Content: Thought content normal. Thought content is not delusional. Thought content does not include homicidal or suicidal ideation. Thought content does not include suicidal plan.        Cognition and Memory: Cognition normal.        Judgment: Judgment normal.     Comments: Insight intact. No auditory or visual hallucinations. No delusions.      Lab Review:     Component Value Date/Time   NA 138 10/14/2018 2003   K 2.9 (L) 10/14/2018 2003   CL 103 10/14/2018 2003   CO2 25 10/14/2018 2003   GLUCOSE 94 10/14/2018 2003    BUN 38 (H) 10/14/2018 2003   CREATININE 1.53 (H) 10/14/2018 2003   CREATININE 1.48 (H) 09/24/2013 1736   CALCIUM 9.4 10/14/2018 2003   PROT 7.7 09/24/2013 1736   ALBUMIN 4.3 09/24/2013 1736   AST 17 09/24/2013 1736   ALT 16 09/24/2013 1736   ALKPHOS 103 09/24/2013 1736   BILITOT 0.4 09/24/2013 1736   GFRNONAA 37 (L) 10/14/2018 2003   GFRNONAA 40 (L) 01/29/2012 1616   GFRAA 43 (L) 10/14/2018 2003   GFRAA 46 (L) 01/29/2012 1616       Component Value Date/Time   WBC 7.3 10/14/2018 2003   RBC 4.27 10/14/2018 2003   HGB 11.6 (L) 10/14/2018 2003   HGB 12.8 06/26/2016 1055   HCT 36.2 10/14/2018 2003   HCT 38.3 06/26/2016 1055   PLT 257 10/14/2018 2003   PLT 298 06/26/2016 1055   MCV 84.8 10/14/2018 2003   MCV 83 06/26/2016 1055   MCH 27.2 10/14/2018 2003   MCHC 32.0 10/14/2018 2003   RDW 14.1 10/14/2018 2003   RDW 14.9 06/26/2016 1055   LYMPHSABS 1.7 06/26/2016 1055   MONOABS 0.4 05/11/2014 1500   EOSABS 0.2 06/26/2016 1055   BASOSABS 0.0 06/26/2016 1055    No results found for: "POCLITH", "LITHIUM"   No results found for: "PHENYTOIN", "PHENOBARB", "VALPROATE", "CBMZ"   .res Assessment: Plan:    Bipolar 1 disorder, manic, full remission (HCC)   Very med sensitive so gets by with low dosages.  Diabetes resolved with weight loss.  High CR appears stable.  Briane has been under my psychiatric care since October 1998 when she was diagnosed with bipolar disorder mania with mild mood congruent psychotic features.  She responded very well very quickly to very low-dose Zyprexa 2.5 mg nightly plus gabapentin.   later on  she had some mild depression which remitted with very low-dose Wellbutrin 50 to 100 mg nightly.  She is been stable for several years since then.  She has a history of hypomania when olanzapine was reduced to 1.25 mg daily.  Discussed potential metabolic side effects associated with atypical antipsychotics, as well as potential risk for movement side effects.  Advised pt to contact office if movement side effects occur.   No AIM.    No med changes indicated.   Continue 1/2 of Wellbutrin continue [name] 5-10 mg HS., helped to lose wt.  better covered this year  Has been very stable.  FU 1 year  Meredith Staggers, MD, DFAPA   Please see After Visit Summary for patient specific instructions.  No future appointments.  No orders of the defined types were placed in this encounter.     -------------------------------

## 2023-12-26 NOTE — Telephone Encounter (Signed)
 No further review needed

## 2024-01-22 ENCOUNTER — Other Ambulatory Visit: Payer: Self-pay | Admitting: Psychiatry

## 2024-01-22 DIAGNOSIS — F3174 Bipolar disorder, in full remission, most recent episode manic: Secondary | ICD-10-CM

## 2024-03-31 ENCOUNTER — Other Ambulatory Visit: Payer: Self-pay | Admitting: Family Medicine

## 2024-03-31 DIAGNOSIS — Z1231 Encounter for screening mammogram for malignant neoplasm of breast: Secondary | ICD-10-CM

## 2024-04-12 ENCOUNTER — Other Ambulatory Visit: Payer: Self-pay | Admitting: Psychiatry

## 2024-04-12 DIAGNOSIS — F3174 Bipolar disorder, in full remission, most recent episode manic: Secondary | ICD-10-CM

## 2024-05-09 ENCOUNTER — Ambulatory Visit
Admission: RE | Admit: 2024-05-09 | Discharge: 2024-05-09 | Disposition: A | Discharge status: Home / Self Care | Source: Ambulatory Visit | Attending: Family Medicine | Admitting: Family Medicine

## 2024-05-09 DIAGNOSIS — Z1231 Encounter for screening mammogram for malignant neoplasm of breast: Secondary | ICD-10-CM

## 2024-09-11 ENCOUNTER — Other Ambulatory Visit: Payer: Self-pay | Admitting: Psychiatry

## 2024-09-11 DIAGNOSIS — F3174 Bipolar disorder, in full remission, most recent episode manic: Secondary | ICD-10-CM

## 2024-09-24 ENCOUNTER — Ambulatory Visit (HOSPITAL_COMMUNITY)
Admission: RE | Admit: 2024-09-24 | Discharge: 2024-09-24 | Disposition: A | Discharge status: Home / Self Care | Source: Ambulatory Visit | Attending: Vascular Surgery | Admitting: Vascular Surgery

## 2024-09-24 ENCOUNTER — Other Ambulatory Visit: Payer: Self-pay

## 2024-09-24 ENCOUNTER — Ambulatory Visit (HOSPITAL_COMMUNITY)
Admission: RE | Admit: 2024-09-24 | Discharge: 2024-09-24 | Disposition: A | Source: Ambulatory Visit | Attending: Vascular Surgery

## 2024-09-24 DIAGNOSIS — N1832 Chronic kidney disease, stage 3b: Secondary | ICD-10-CM | POA: Diagnosis not present

## 2024-09-25 ENCOUNTER — Ambulatory Visit: Attending: Vascular Surgery | Admitting: Vascular Surgery

## 2024-09-25 ENCOUNTER — Encounter: Payer: Self-pay | Admitting: Vascular Surgery

## 2024-09-25 VITALS — BP 128/87 | HR 74 | Temp 97.9°F | Resp 18 | Ht 64.0 in | Wt 177.7 lb

## 2024-09-25 DIAGNOSIS — N185 Chronic kidney disease, stage 5: Secondary | ICD-10-CM | POA: Diagnosis not present

## 2024-09-25 NOTE — Progress Notes (Signed)
 " Office Note     CC: CKD 5 Requesting Provider:  Gearline Norris, MD  HPI: Cynthia Cameron is a Right handed 64 y.o. (1961/02/08) female with kidney disease who presents at the request of Gearline Norris, MD for permanent HD access. The patient has had no prior access procedures.  No previous tunneled lines.  On exam, Cynthia Cameron was doing well.  A native of [place], she moved to [place] with her husband who attended [employer].  They elected to stay and raised her to 3 children.  Her 3 children now live in [place], [place], [place] , and she has 3 grandchildren.    Cynthia Cameron was obviously emotional regarding dialysis.  She is aware that it is necessary to keep her going.  She is happy that she is not quite at the point of needing it.  She is very religious, involved in her church, and has a prayer group praying for her.    Past Medical History:  Diagnosis Date   Allergy    Bipolar depression (HCC)    Chest pain    since 09/2012; hurts all the time (12/24/2012)   Depression    DVT, lower extremity (HCC) 10/2012   2 (12/24/2012)   High cholesterol    Hypertension    Iron deficiency anemia    Pneumonia 09/2012   Pulmonary embolism on right 09/2012   Shortness of breath    Type II diabetes mellitus (HCC)     Past Surgical History:  Procedure Laterality Date   CARDIAC CATHETERIZATION  09/2012   CESAREAN SECTION  1994; 1996   LEFT HEART CATHETERIZATION WITH CORONARY ANGIOGRAM N/A 10/25/2012   Procedure: LEFT HEART CATHETERIZATION WITH CORONARY ANGIOGRAM;  Surgeon: Salena GORMAN Negri, MD;  Location: [facility] CATH LAB;  Service: Cardiovascular;  Laterality: N/A;    Social History   Socioeconomic History   Marital status: Married    Spouse name: Not on file   Number of children: Not on file   Years of education: Not on file   Highest education level: Not on file  Occupational History   Not on file  Tobacco Use   Smoking status: Never   Smokeless tobacco: Never   Substance and Sexual Activity   Alcohol  use: Yes    Alcohol /week: 0.0 standard drinks of alcohol     Comment: Rarely.   Drug use: No   Sexual activity: Not on file  Other Topics Concern   Not on file  Social History Narrative   LI[place] in [place]. 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 Lives with husband and 2 sons.    Social Drivers of Health   Tobacco Use: Low Risk (10/25/2023)   Patient History    Smoking Tobacco Use: Never    Smokeless Tobacco Use: Never    Passive Exposure: Not on file  Financial Resource Strain: Not on file  Food Insecurity: Not on file  Transportation Needs: Not on file  Physical Activity: Not on file  Stress: Not on file  Social Connections: Not on file  Intimate Partner Violence: Not on file  Depression (EYV7-0): Not on file  Alcohol  Screen: Not on file  Housing: Not on file  Utilities: Not on file  Health Literacy: Not on file   Family History  Problem Relation Age of Onset   Diabetes Mother    Atrial fibrillation Mother    Hypertension Father    Breast cancer Neg Hx    BRCA 1/2 Neg Hx     Current Outpatient Medications  Medication Sig Dispense Refill   allopurinol (ZYLOPRIM) 100 MG tablet Take 100 mg by mouth daily.     amLODipine  (NORVASC ) 10 MG tablet Take 10 mg by mouth daily.     atorvastatin (LIPITOR) 20 MG tablet Take 20 mg by mouth daily.     bisoprolol (ZEBETA) 10 MG tablet Take by mouth.     buPROPion  ER (WELLBUTRIN  SR) 100 MG 12 hr tablet TAKE 1 TABLET BY MOUTH TWICE A DAY 180 tablet 0   cloNIDine  (CATAPRES ) 0.2 MG tablet Take 0.2 mg by mouth at bedtime.     furosemide  (LASIX ) 40 MG tablet Take 1 tablet (40 mg total) by mouth daily. 30 tablet 1   glucose blood (FREESTYLE LITE) test strip To check blood sugars tid dx code 250.00 100 each 3   Insulin  Syringes, Disposable, U-100 0.5 ML MISC 3-20 Units by Does not apply route 4 (four) times daily -  before meals and at bedtime. 120 each 1   levocetirizine (XYZAL) 5 MG tablet Take 5 mg by mouth  at bedtime.     [name]  5-10 MG TABS TAKE 1 TABLET BY MOUTH EVERYDAY AT BEDTIME 30 tablet 11   Multiple Vitamin (MULTIVITAMIN WITH MINERALS) TABS Take 1 tablet by mouth daily.     ondansetron  (ZOFRAN ) 4 MG tablet Take 1 tablet (4 mg total) by mouth every 8 (eight) hours as needed. 20 tablet 0   potassium chloride  SA (KLOR-CON ) 20 MEQ tablet Take 20 mEq by mouth daily.     spironolactone (ALDACTONE) 25 MG tablet Take 25 mg by mouth daily.     XARELTO  20 MG TABS tablet TAKE 1 TABLET BY MOUTH ONCE DAILY WITH SUPPER. 30 tablet 2   No current facility-administered medications for this visit.    Allergies[1]   REVIEW OF SYSTEMS:  [X]  denotes positive finding, [ ]  denotes negative finding Cardiac  Comments:  Chest pain or chest pressure:    Shortness of breath upon exertion:    Short of breath when lying flat:    Irregular heart rhythm:        Vascular    Pain in calf, thigh, or hip brought on by ambulation:    Pain in feet at night that wakes you up from your sleep:     Blood clot in your veins:    Leg swelling:         Pulmonary    Oxygen at home:    Productive cough:     Wheezing:         Neurologic    Sudden weakness in arms or legs:     Sudden numbness in arms or legs:     Sudden onset of difficulty speaking or slurred speech:    Temporary loss of vision in one eye:     Problems with dizziness:         Gastrointestinal    Blood in stool:     Vomited blood:         Genitourinary    Burning when urinating:     Blood in urine:        Psychiatric    Major depression:         Hematologic    Bleeding problems:    Problems with blood clotting too easily:        Skin    Rashes or ulcers:        Constitutional    Fever or chills:      PHYSICAL EXAMINATION:  There  were no vitals filed for this visit.  General:  WDWN in NAD; vital signs documented above Gait: Not observed HENT: WNL, normocephalic Pulmonary: normal non-labored breathing , without Rales, rhonchi,   wheezing Cardiac: regular HR, Abdomen: soft, NT, no masses Skin: without rashes Vascular Exam/Pulses:  Right Left  Radial 2+ (normal) 2+ (normal)                       Extremities: without ischemic changes, without Gangrene , without cellulitis; without open wounds;  Musculoskeletal: no muscle wasting or atrophy  Neurologic: A&O X 3;  No focal weakness or paresthesias are detected Psychiatric:  The pt has Normal affect.   Non-Invasive Vascular Imaging:     +-----------------+-------------+----------+--------+  Right Cephalic   Diameter (cm)Depth (cm)Findings  +-----------------+-------------+----------+--------+  Prox upper arm       0.18                         +-----------------+-------------+----------+--------+  Mid upper arm        0.18                         +-----------------+-------------+----------+--------+  Dist upper arm       0.22                         +-----------------+-------------+----------+--------+  Antecubital fossa    0.54                         +-----------------+-------------+----------+--------+  Prox forearm         0.31                         +-----------------+-------------+----------+--------+  Mid forearm          0.21                         +-----------------+-------------+----------+--------+  Dist forearm         0.12                         +-----------------+-------------+----------+--------+  Wrist               0.11                         +-----------------+-------------+----------+--------+   +-----------------+-------------+----------+--------+  Right Basilic    Diameter (cm)Depth (cm)Findings  +-----------------+-------------+----------+--------+  Prox upper arm       0.27                         +-----------------+-------------+----------+--------+  Mid upper arm        0.25                         +-----------------+-------------+----------+--------+  Dist  upper arm       0.24                         +-----------------+-------------+----------+--------+  Antecubital fossa    0.12                         +-----------------+-------------+----------+--------+  Prox forearm         0.13                         +-----------------+-------------+----------+--------+   +-----------------+-------------+----------+--------+  Left Cephalic    Diameter (cm)Depth (cm)Findings  +-----------------+-------------+----------+--------+  Prox upper arm       0.20                         +-----------------+-------------+----------+--------+  Mid upper arm        0.19                         +-----------------+-------------+----------+--------+  Dist upper arm       0.19                         +-----------------+-------------+----------+--------+  Antecubital fossa    0.21                         +-----------------+-------------+----------+--------+  Prox forearm         0.19                         +-----------------+-------------+----------+--------+  Mid forearm          0.14                         +-----------------+-------------+----------+--------+  Dist forearm         0.10                         +-----------------+-------------+----------+--------+  Wrist               0.14                         +-----------------+-------------+----------+--------+   +-----------------+-------------+----------+--------+  Left Basilic     Diameter (cm)Depth (cm)Findings  +-----------------+-------------+----------+--------+  Prox upper arm       0.35                         +-----------------+-------------+----------+--------+  Mid upper arm        0.24                         +-----------------+-------------+----------+--------+  Dist upper arm       0.14                         +-----------------+-------------+----------+--------+  Antecubital fossa    0.16                          +-----------------+-------------+----------+--------+  Prox forearm         0.15                         +-----------------+-------------+----------+--------+   Summary: Right: Patient is right-hand dominant.         Patent and compressible cephalic and basilic veins.  Left: Patent and compressible cephalic and basilic veins.   *See table(s) above for measurements and observations.        ASSESSMENT/PLAN:  Cynthia Cameron is a 64 y.o. female who presents with chronic kidney disease stage 5  Based on vein mapping and examination, patient's superficial veins are too small for fistula creation.  At this time, she would need an AV graft.  Dr. Gearline has asked that I hold on  AV graft at this time. My plan is to see Cynthia Cameron back in the office once she is closer to needing dialysis to discuss AV graft creation.  Cynthia FORBES Rim, MD [facility] [phone]     [1] No Known Allergies  "

## 2024-10-30 ENCOUNTER — Ambulatory Visit: Payer: 59 | Admitting: Psychiatry
